# Patient Record
Sex: Male | Born: 1960 | Race: White | Hispanic: No | Marital: Single | Smoking: Former smoker
Health system: Southern US, Community
[De-identification: ages and names within clinical notes are randomized; demographics above are authoritative.]

## PROBLEM LIST (undated history)

## (undated) DIAGNOSIS — G43909 Migraine, unspecified, not intractable, without status migrainosus: Secondary | ICD-10-CM

## (undated) DIAGNOSIS — I10 Essential (primary) hypertension: Secondary | ICD-10-CM

## (undated) DIAGNOSIS — Q859 Phakomatosis, unspecified: Secondary | ICD-10-CM

## (undated) DIAGNOSIS — R569 Unspecified convulsions: Secondary | ICD-10-CM

## (undated) DIAGNOSIS — K219 Gastro-esophageal reflux disease without esophagitis: Secondary | ICD-10-CM

## (undated) DIAGNOSIS — I2510 Atherosclerotic heart disease of native coronary artery without angina pectoris: Secondary | ICD-10-CM

## (undated) DIAGNOSIS — J449 Chronic obstructive pulmonary disease, unspecified: Secondary | ICD-10-CM

## (undated) DIAGNOSIS — G8929 Other chronic pain: Secondary | ICD-10-CM

## (undated) DIAGNOSIS — M549 Dorsalgia, unspecified: Secondary | ICD-10-CM

## (undated) DIAGNOSIS — F319 Bipolar disorder, unspecified: Secondary | ICD-10-CM

## (undated) DIAGNOSIS — R0602 Shortness of breath: Secondary | ICD-10-CM

## (undated) DIAGNOSIS — R3915 Urgency of urination: Secondary | ICD-10-CM

## (undated) DIAGNOSIS — E782 Mixed hyperlipidemia: Secondary | ICD-10-CM

## (undated) DIAGNOSIS — E7800 Pure hypercholesterolemia, unspecified: Secondary | ICD-10-CM

## (undated) DIAGNOSIS — G4730 Sleep apnea, unspecified: Secondary | ICD-10-CM

## (undated) DIAGNOSIS — G4700 Insomnia, unspecified: Secondary | ICD-10-CM

## (undated) DIAGNOSIS — F79 Unspecified intellectual disabilities: Secondary | ICD-10-CM

## (undated) DIAGNOSIS — M1990 Unspecified osteoarthritis, unspecified site: Secondary | ICD-10-CM

## (undated) DIAGNOSIS — R918 Other nonspecific abnormal finding of lung field: Secondary | ICD-10-CM

## (undated) DIAGNOSIS — K259 Gastric ulcer, unspecified as acute or chronic, without hemorrhage or perforation: Secondary | ICD-10-CM

## (undated) DIAGNOSIS — R350 Frequency of micturition: Secondary | ICD-10-CM

## (undated) DIAGNOSIS — F419 Anxiety disorder, unspecified: Secondary | ICD-10-CM

## (undated) DIAGNOSIS — S0990XA Unspecified injury of head, initial encounter: Secondary | ICD-10-CM

## (undated) DIAGNOSIS — R7303 Prediabetes: Secondary | ICD-10-CM

## (undated) DIAGNOSIS — J45909 Unspecified asthma, uncomplicated: Secondary | ICD-10-CM

## (undated) DIAGNOSIS — Z87442 Personal history of urinary calculi: Secondary | ICD-10-CM

## (undated) DIAGNOSIS — M2550 Pain in unspecified joint: Secondary | ICD-10-CM

## (undated) DIAGNOSIS — N189 Chronic kidney disease, unspecified: Secondary | ICD-10-CM

## (undated) DIAGNOSIS — G629 Polyneuropathy, unspecified: Secondary | ICD-10-CM

## (undated) DIAGNOSIS — F329 Major depressive disorder, single episode, unspecified: Secondary | ICD-10-CM

## (undated) HISTORY — DX: Major depressive disorder, single episode, unspecified: F32.9

## (undated) HISTORY — DX: Mixed hyperlipidemia: E78.2

## (undated) HISTORY — DX: Phakomatosis, unspecified: Q85.9

## (undated) HISTORY — DX: Morbid (severe) obesity due to excess calories: E66.01

## (undated) HISTORY — DX: Other nonspecific abnormal finding of lung field: R91.8

## (undated) HISTORY — DX: Essential (primary) hypertension: I10

---

## 2007-08-02 HISTORY — PX: LITHOTRIPSY: SUR834

## 2009-05-29 ENCOUNTER — Ambulatory Visit: Payer: Self-pay | Admitting: Cardiology

## 2011-03-31 ENCOUNTER — Encounter: Payer: Self-pay | Admitting: Emergency Medicine

## 2011-03-31 ENCOUNTER — Emergency Department (HOSPITAL_COMMUNITY): Payer: PRIVATE HEALTH INSURANCE

## 2011-03-31 ENCOUNTER — Emergency Department (HOSPITAL_COMMUNITY)
Admission: EM | Admit: 2011-03-31 | Discharge: 2011-04-01 | Disposition: A | Discharge status: Other Acute Inpatient Hospital | Payer: PRIVATE HEALTH INSURANCE | Source: Home / Self Care | Attending: Emergency Medicine | Admitting: Emergency Medicine

## 2011-03-31 ENCOUNTER — Inpatient Hospital Stay (HOSPITAL_COMMUNITY): Admission: AD | Admit: 2011-03-31 | Payer: Medicare Other | Source: Ambulatory Visit | Admitting: Psychiatry

## 2011-03-31 DIAGNOSIS — R918 Other nonspecific abnormal finding of lung field: Secondary | ICD-10-CM

## 2011-03-31 DIAGNOSIS — R45851 Suicidal ideations: Secondary | ICD-10-CM | POA: Insufficient documentation

## 2011-03-31 DIAGNOSIS — R222 Localized swelling, mass and lump, trunk: Secondary | ICD-10-CM | POA: Insufficient documentation

## 2011-03-31 DIAGNOSIS — E119 Type 2 diabetes mellitus without complications: Secondary | ICD-10-CM | POA: Insufficient documentation

## 2011-03-31 DIAGNOSIS — I2510 Atherosclerotic heart disease of native coronary artery without angina pectoris: Secondary | ICD-10-CM | POA: Insufficient documentation

## 2011-03-31 DIAGNOSIS — E7800 Pure hypercholesterolemia, unspecified: Secondary | ICD-10-CM | POA: Insufficient documentation

## 2011-03-31 DIAGNOSIS — I10 Essential (primary) hypertension: Secondary | ICD-10-CM | POA: Insufficient documentation

## 2011-03-31 DIAGNOSIS — R079 Chest pain, unspecified: Secondary | ICD-10-CM

## 2011-03-31 DIAGNOSIS — F341 Dysthymic disorder: Secondary | ICD-10-CM | POA: Insufficient documentation

## 2011-03-31 HISTORY — DX: Essential (primary) hypertension: I10

## 2011-03-31 HISTORY — DX: Pure hypercholesterolemia, unspecified: E78.00

## 2011-03-31 HISTORY — DX: Polyneuropathy, unspecified: G62.9

## 2011-03-31 HISTORY — DX: Anxiety disorder, unspecified: F41.9

## 2011-03-31 HISTORY — DX: Atherosclerotic heart disease of native coronary artery without angina pectoris: I25.10

## 2011-03-31 LAB — RAPID URINE DRUG SCREEN, HOSP PERFORMED: Amphetamines: NOT DETECTED

## 2011-03-31 LAB — CBC
HCT: 42.6 % (ref 39.0–52.0)
MCV: 85.7 fL (ref 78.0–100.0)
MCV: 85.7 fL (ref 78.0–100.0)
Platelets: 206 10*3/uL (ref 150–400)
RBC: 4.97 MIL/uL (ref 4.22–5.81)
RBC: 4.9 MIL/uL (ref 4.22–5.81)
WBC: 11.2 10*3/uL — ABNORMAL HIGH (ref 4.0–10.5)
WBC: 11.2 10*3/uL — ABNORMAL HIGH (ref 4.0–10.5)

## 2011-03-31 LAB — DIFFERENTIAL
Eosinophils Relative: 1 % (ref 0–5)
Lymphocytes Relative: 20 % (ref 12–46)
Lymphs Abs: 2.3 10*3/uL (ref 0.7–4.0)
Neutro Abs: 8.1 10*3/uL — ABNORMAL HIGH (ref 1.7–7.7)

## 2011-03-31 LAB — CARDIAC PANEL(CRET KIN+CKTOT+MB+TROPI)
CK, MB: 4.1 ng/mL — ABNORMAL HIGH (ref 0.3–4.0)
Relative Index: 2.1 (ref 0.0–2.5)
Total CK: 194 U/L (ref 7–232)
Troponin I: <0.3 ng/mL (ref <0.30)

## 2011-03-31 LAB — COMPREHENSIVE METABOLIC PANEL
BUN: 13 mg/dL (ref 6–23)
CO2: 28 mEq/L (ref 19–32)
Chloride: 101 mEq/L (ref 96–112)
Creatinine, Ser: 0.91 mg/dL (ref 0.50–1.35)
GFR calc non Af Amer: >60 mL/min (ref >60)
Total Bilirubin: 0.5 mg/dL (ref 0.3–1.2)

## 2011-03-31 LAB — ETHANOL: Alcohol, Ethyl (B): <11 mg/dL (ref 0–11)

## 2011-03-31 LAB — ACETAMINOPHEN LEVEL: Acetaminophen (Tylenol), Serum: <15 ug/mL (ref 10–30)

## 2011-03-31 MED ORDER — METOPROLOL TARTRATE 50 MG PO TABS
ORAL_TABLET | ORAL | Status: AC
  Administered 2011-03-31: 25 mg
  Filled 2011-03-31: qty 1

## 2011-03-31 MED ORDER — LORAZEPAM 1 MG PO TABS
1.0000 mg | ORAL_TABLET | Freq: Once | ORAL | Status: AC
  Administered 2011-03-31: 1 mg via ORAL
  Filled 2011-03-31: qty 1

## 2011-03-31 MED ORDER — METOPROLOL SUCCINATE ER 25 MG PO TB24
25.0000 mg | ORAL_TABLET | Freq: Every day | ORAL | Status: DC
  Administered 2011-03-31: 25 mg via ORAL

## 2011-03-31 MED ORDER — IOHEXOL 350 MG/ML SOLN
120.0000 mL | Freq: Once | INTRAVENOUS | Status: AC | PRN
  Administered 2011-03-31: 120 mL via INTRAVENOUS

## 2011-03-31 NOTE — Progress Notes (Signed)
Patient reports experiencing palpitations onset several minutes ago and persistent since. Will order Cardiac panel and EKG.

## 2011-03-31 NOTE — ED Provider Notes (Addendum)
History   Scribed for Tamanna Whitson K Trequan Marsolek-Rasch, MD, the patient was seen in room APA17/APA17. This chart was scribed by Clarita Crane. This patient's care was started at 4:58PM.  CSN: [id number] Arrival date & time: 03/31/2011  4:56 PM  Chief Complaint  Patient presents with  . Medical Clearance   The history is provided by the patient and a relative.  History also provided by accompanying IVC paperwork.   Charles Mathews is a 50 y.o. male who presents to the Emergency Department complaining of suicidal ideations. Patient states he was being treated at [facility] today and expressed SI to counselor and noted a plan to overdose on "anything I had". Patient states his "nerves have been bad" and attributes this to his medications.  Denies auditory hallucinations, visual hallucinations, HI, previous suicide attempts, previous hospitalizations for depression or other psychiatric symptoms. Patient also notes dysuria onset yesterday and persistent since. Denies penile d/c. Patient with h/o hypertension, neuropathy, high cholesterol, CAD, diabetes, anxiety and depression.   HPI ELEMENTS  Onset: today Duration: persistent since onset  Timing: constant  Context:  as above  Associated symptoms:  Denies auditory hallucinations, visual hallucinations, HI,     PAST MEDICAL HISTORY:  Past Medical History  Diagnosis Date  . Hypertension   . Neuropathy   . High cholesterol   . Coronary artery disease   . Diabetes mellitus   . Anxiety   . Depression     PAST SURGICAL HISTORY:  History reviewed. No pertinent past surgical history.  MEDICATIONS:  Previous Medications   No medications on file     ALLERGIES:  Allergies as of 03/31/2011 - never reviewed  Allergen Reaction Noted  . Penicillins  03/31/2011     FAMILY HISTORY:  History reviewed. No pertinent family history.   SOCIAL HISTORY: History   Social History  . Marital Status: Single    Spouse Name: N/A    Number of Children: N/A   . Years of Education: N/A   Social History Main Topics  . Smoking status: Never Smoker   . Smokeless tobacco: None  . Alcohol Use: No  . Drug Use: No  . Sexually Active:    Other Topics Concern  . None   Social History Narrative  . None      Review of Systems 10 Systems reviewed and are negative for acute change except as noted in the HPI.  Physical Exam  BP 160/86  Pulse 87  Temp 98.9 F (37.2 C)  Resp 20  Ht 5\' 9"  (1.753 m)  Wt 295 lb (133.811 kg)  BMI 43.56 kg/m2  SpO2 97%  Physical Exam  Constitutional: He is oriented to person, place, and time. He appears well-developed and well-nourished.  HENT:  Head: Normocephalic and atraumatic.  Mouth/Throat: No oropharyngeal exudate.       Moist mucous membranes.   Eyes: Conjunctivae are normal. Pupils are equal, round, and reactive to light.  Neck: Neck supple. No tracheal deviation present. No thyromegaly present.  Cardiovascular: Normal rate and regular rhythm.  Exam reveals no gallop and no friction rub.   Murmur (systolic ejection 1/6) heard. Pulmonary/Chest: Effort normal and breath sounds normal. He has no wheezes.  Abdominal: Soft. Bowel sounds are normal. He exhibits no distension and no mass. There is no tenderness. There is no guarding.  Musculoskeletal: Normal range of motion. He exhibits no edema.  Lymphadenopathy:    He has no cervical adenopathy.  Neurological: He is alert and oriented to person, place, and  time. He has normal strength. No sensory deficit. GCS eye subscore is 4. GCS verbal subscore is 5. GCS motor subscore is 6.       No sensory deficits.   Skin: Skin is warm and dry.  Psychiatric: He has a normal mood and affect. His behavior is normal. He expresses suicidal ideation. He expresses no homicidal ideation.    ED Course  Procedures  OTHER DATA REVIEWED: Nursing notes, vital signs, and past medical records reviewed. Lab results reviewed and considered Imaging results reviewed and  considered  DIAGNOSTIC STUDIES: Oxygen Saturation is 97% on room air, normal by my interpretation.    LABS / RADIOLOGY: Results for orders placed during the hospital encounter of 03/31/11  CBC      Component Value Range   WBC 11.2 (*) 4.0 - 10.5 (K/uL)   RBC 4.97  4.22 - 5.81 (MIL/uL)   Hemoglobin 13.7  13.0 - 17.0 (g/dL)   HCT 56.2  13.0 - 86.5 (%)   MCV 85.7  78.0 - 100.0 (fL)   MCH 27.6  26.0 - 34.0 (pg)   MCHC 32.2  30.0 - 36.0 (g/dL)   RDW 78.4  69.6 - 29.5 (%)   Platelets 180  150 - 400 (K/uL)   No results found.  PROCEDURES:  ED COURSE / COORDINATION OF CARE: Orders Placed This Encounter  Procedures  . CBC  . Comprehensive metabolic panel  . Ethanol  . Acetaminophen level  . Drug screen panel, emergency  . Sitter at bedside  . Flight risk     MDM: Differential Diagnosis: suicidal idation   PLAN: ACT and hospital admssion The patient is to return the emergency department if there is any worsening of symptoms. I have reviewed the discharge instructions with the patient/family  CONDITION ON DISCHARGE:    MEDICATIONS GIVEN IN THE E.D. Medications - No data to display   Date: 03/31/2011  Rate:106  Rhythm: sinus tachycardia  QRS Axis: normal  Intervals: PR prolonged  ST/T Wave abnormalities: normal  Conduction Disutrbances:none  Narrative Interpretation: sinus tachycardia  Old EKG Reviewed: none available   I personally performed the services described in this documentation, which was scribed in my presence. The recorded information has been reviewed and considered. Kdyn Vonbehren Smitty Cords, MD  Negative [facility] score      Carmelita Amparo K Renn Stille-Rasch, MD 03/31/11 1813  Danyah Guastella K Jacci Ruberg-Rasch, MD 03/31/11 1958  Tammie Yanda K Clydean Posas-Rasch, MD 03/31/11 2115

## 2011-03-31 NOTE — ED Notes (Addendum)
Patient report given to this nurse. Assuming care of patient. Into room to see patient. Standing up at bedside. States he just doesn't feel right. When asked how he doesn't feel right, patient states he does not know. States he feels like his heart is beating fast. Vitals rechecked. See vitals signs for reassessment. Placed in bed. Resting comfortably at this time. Sitter at bedside. Patient denies any needs. MD aware. Note written by Milinda Cave, RN.

## 2011-03-31 NOTE — ED Notes (Signed)
Per ivc papers-pt is depressed, suicidal, paranoid and has plan to overdose. He has no support system. Pt denies si/hi. Cooperative in triage.

## 2011-03-31 NOTE — ED Notes (Addendum)
Remains resting with eyes open and lights on. Denies any needs or pain. Sitter with patient. Call bell near patient. Will continue to monitor. No shortness of breath or chest pain.

## 2011-03-31 NOTE — ED Notes (Signed)
Patient resting sitting up in chair. Denies pain. Denies any needs. No shortness of breath. Sitter at bedside. No distress. Call bell near patient.

## 2011-03-31 NOTE — ED Notes (Signed)
Patient back to room from radiology. 

## 2011-03-31 NOTE — ED Notes (Signed)
Patient to radiology at this time.

## 2011-03-31 NOTE — ED Notes (Signed)
Patient calmer at this time. Sitting up in chair. Denies pain. Vitals rechecked. Denies any needs at this time. Calm, cooperative. Will continue to monitor.

## 2011-03-31 NOTE — ED Notes (Signed)
After each medication administration, patient has gone to the bathroom shortly after. Asked patient if he has swallowed medication after each administration. States he has been. MD aware.

## 2011-03-31 NOTE — ED Notes (Signed)
Into room to see patient. Patient standing up at bedside. Appears anxious. States he "just doesn't feel right". Repeats self saying, "please help me." rechecked vitals. Patient is tachycardic. Denies pain but states he feels like his heartbeat is in his stomach. Active bowel sounds. States it is tender on palpation. Chest not tender. Is slightly shaky. MD made aware of situation.

## 2011-03-31 NOTE — ED Notes (Signed)
Patient back to room from radiology. No distress. Denies any needs. Call bell and sitter at bedside. Will continue to monitor.

## 2011-03-31 NOTE — ED Notes (Signed)
MD at bedside with patient

## 2011-03-31 NOTE — ED Notes (Signed)
Patient ambulatory to radiology at this time. States he is feeling a lot better. Denies pain.

## 2011-03-31 NOTE — ED Notes (Signed)
Per sitter, prior to this nurse's arrival to room, patient was in kneeling position in room then got down into floor into fetal position. Patient was gotten up out of floor and sat on bed. When this nurse arrived into room, asked patient if he took any medications or drugs prior to arrival. Denies any type of drug use. MD is aware of situation.

## 2011-04-01 ENCOUNTER — Inpatient Hospital Stay (HOSPITAL_COMMUNITY)
Admission: AD | Admit: 2011-04-01 | Discharge: 2011-04-05 | DRG: 204 | Disposition: A | Discharge status: Other Acute Inpatient Hospital | Payer: PRIVATE HEALTH INSURANCE | Source: Other Acute Inpatient Hospital | Attending: Internal Medicine | Admitting: Internal Medicine

## 2011-04-01 DIAGNOSIS — R079 Chest pain, unspecified: Secondary | ICD-10-CM | POA: Diagnosis present

## 2011-04-01 DIAGNOSIS — F259 Schizoaffective disorder, unspecified: Secondary | ICD-10-CM | POA: Diagnosis present

## 2011-04-01 DIAGNOSIS — D72829 Elevated white blood cell count, unspecified: Secondary | ICD-10-CM | POA: Diagnosis present

## 2011-04-01 DIAGNOSIS — R222 Localized swelling, mass and lump, trunk: Secondary | ICD-10-CM

## 2011-04-01 DIAGNOSIS — Z6835 Body mass index (BMI) 35.0-35.9, adult: Secondary | ICD-10-CM

## 2011-04-01 DIAGNOSIS — I10 Essential (primary) hypertension: Secondary | ICD-10-CM | POA: Diagnosis present

## 2011-04-01 DIAGNOSIS — R45851 Suicidal ideations: Secondary | ICD-10-CM

## 2011-04-01 DIAGNOSIS — K219 Gastro-esophageal reflux disease without esophagitis: Secondary | ICD-10-CM | POA: Diagnosis present

## 2011-04-01 DIAGNOSIS — N390 Urinary tract infection, site not specified: Secondary | ICD-10-CM | POA: Diagnosis present

## 2011-04-01 LAB — COMPREHENSIVE METABOLIC PANEL
ALT: 18 U/L (ref 0–53)
AST: 15 U/L (ref 0–37)
Albumin: 3.5 g/dL (ref 3.5–5.2)
CO2: 26 mEq/L (ref 19–32)
Calcium: 9 mg/dL (ref 8.4–10.5)
GFR calc non Af Amer: >60 mL/min (ref >60)
Sodium: 138 mEq/L (ref 135–145)
Total Protein: 6 g/dL (ref 6.0–8.3)

## 2011-04-01 LAB — URINALYSIS, MICROSCOPIC ONLY
Leukocytes, UA: NEGATIVE
Nitrite: NEGATIVE
Specific Gravity, Urine: 1.015 (ref 1.005–1.030)
Urine-Other: NONE SEEN
Urobilinogen, UA: 0.2 mg/dL (ref 0.0–1.0)

## 2011-04-01 LAB — CARDIAC PANEL(CRET KIN+CKTOT+MB+TROPI)
CK, MB: 3.5 ng/mL (ref 0.3–4.0)
Relative Index: 1.8 (ref 0.0–2.5)
Troponin I: <0.3 ng/mL (ref <0.30)
Troponin I: <0.3 ng/mL (ref <0.30)

## 2011-04-01 LAB — CBC
MCH: 28.4 pg (ref 26.0–34.0)
Platelets: 170 10*3/uL (ref 150–400)
RBC: 4.58 MIL/uL (ref 4.22–5.81)
RDW: 12.8 % (ref 11.5–15.5)

## 2011-04-01 NOTE — ED Notes (Addendum)
Dr. Rito Ehrlich at bedside with patient to evaluate for admission.

## 2011-04-01 NOTE — ED Notes (Signed)
Remains resting on left side in bed. No facial grimaces. Equal chest rise and fall. Call bell and sitter with patient.

## 2011-04-01 NOTE — ED Notes (Signed)
Patient to be transferred to [facility]. No bed assignment as of yet. AP charge nurse called [facility] Supervisor regarding sitter for patient. No available sitters at [facility]. Supervisor calling [facility] regarding available sitters.

## 2011-04-01 NOTE — ED Notes (Signed)
Remains resting in bed on back. Equal chest rise and fall. No distress. No facial grimacing. Call bell near patient.

## 2011-04-01 NOTE — ED Notes (Signed)
Remains resting in bed on left side. No facial grimaces. Call bell and sitter with patient. Equal chest rise and fall. No distress.

## 2011-04-01 NOTE — ED Notes (Signed)
Patient sitting up in bed. States he can not sleep and would like something to sleep. Patients pain 5\10. Denies other needs. Call bell and sitter at bedside. MD aware.

## 2011-04-01 NOTE — ED Notes (Signed)
Pt resting in bed, sitter at bedside

## 2011-04-01 NOTE — ED Notes (Signed)
Report to cristy rn

## 2011-04-01 NOTE — ED Notes (Signed)
Pt awake offered, meal/snack, bathroom, declined offer, vss, sitter remains at bedside.  Awaiting transport to [facility]

## 2011-04-01 NOTE — ED Notes (Signed)
Into room to obtain discharge vitals. Patient states he has been having chest discomfort since beginning of shift when episode of "not feeling right" started but states it is a 6\10 and is feeling a lot better. Denies wanting anything for the pain.

## 2011-04-01 NOTE — ED Notes (Signed)
Unit clerk spoke to briget w. [facility], stated that they are unable to give room assignment at this time, nor can send a [facility] truck until after 7am.

## 2011-04-01 NOTE — ED Notes (Signed)
Pt cont. To await transfer, awaiting bed assignment at [facility] long.

## 2011-04-01 NOTE — ED Notes (Signed)
Pt asleep in chair, sitter at bedside, awaiting transport to [place].

## 2011-04-01 NOTE — ED Notes (Signed)
Patient up to bathroom. Steady gait. Denies any needs. No distress. Equal chest rise and fall.

## 2011-04-01 NOTE — ED Notes (Signed)
Remains resting in bed on left side. Eyes closed. No distress. Equal chest rise and fall. No facial grimacing. Sitter with patient. Call bell at bedside.

## 2011-04-02 LAB — CBC
MCHC: 33.5 g/dL (ref 30.0–36.0)
Platelets: 210 10*3/uL (ref 150–400)
RDW: 13 % (ref 11.5–15.5)
WBC: 12.5 10*3/uL — ABNORMAL HIGH (ref 4.0–10.5)

## 2011-04-02 LAB — COMPREHENSIVE METABOLIC PANEL
AST: 16 U/L (ref 0–37)
Albumin: 3.8 g/dL (ref 3.5–5.2)
Alkaline Phosphatase: 85 U/L (ref 39–117)
Chloride: 103 mEq/L (ref 96–112)
Potassium: 3.8 mEq/L (ref 3.5–5.1)
Total Bilirubin: 0.6 mg/dL (ref 0.3–1.2)
Total Protein: 7.1 g/dL (ref 6.0–8.3)

## 2011-04-02 LAB — LIPID PANEL
LDL Cholesterol: 78 mg/dL (ref 0–99)
Total CHOL/HDL Ratio: 3.4 RATIO
Triglycerides: 84 mg/dL (ref <150)
VLDL: 17 mg/dL (ref 0–40)

## 2011-04-02 LAB — PSA: PSA: 0.15 ng/mL (ref <=4.00)

## 2011-04-03 LAB — URINE CULTURE: Culture  Setup Time: 201209010041

## 2011-04-03 LAB — CBC
HCT: 42.4 % (ref 39.0–52.0)
Hemoglobin: 13.4 g/dL (ref 13.0–17.0)
MCH: 27.3 pg (ref 26.0–34.0)
MCHC: 31.6 g/dL (ref 30.0–36.0)
MCV: 86.4 fL (ref 78.0–100.0)
Platelets: 193 K/uL (ref 150–400)
RBC: 4.91 MIL/uL (ref 4.22–5.81)
RDW: 12.9 % (ref 11.5–15.5)
WBC: 11.8 K/uL — ABNORMAL HIGH (ref 4.0–10.5)

## 2011-04-04 LAB — BASIC METABOLIC PANEL
CO2: 24 mEq/L (ref 19–32)
Calcium: 9.3 mg/dL (ref 8.4–10.5)
Chloride: 101 mEq/L (ref 96–112)
Glucose, Bld: 112 mg/dL — ABNORMAL HIGH (ref 70–99)
Potassium: 4.3 mEq/L (ref 3.5–5.1)
Sodium: 136 mEq/L (ref 135–145)

## 2011-04-05 ENCOUNTER — Inpatient Hospital Stay (HOSPITAL_COMMUNITY)
Admission: AD | Admit: 2011-04-05 | Discharge: 2011-04-08 | DRG: 885 | Disposition: A | Discharge status: Home / Self Care | Payer: PRIVATE HEALTH INSURANCE | Source: Ambulatory Visit | Attending: Psychiatry | Admitting: Psychiatry

## 2011-04-05 DIAGNOSIS — K449 Diaphragmatic hernia without obstruction or gangrene: Secondary | ICD-10-CM

## 2011-04-05 DIAGNOSIS — F332 Major depressive disorder, recurrent severe without psychotic features: Principal | ICD-10-CM

## 2011-04-05 DIAGNOSIS — I10 Essential (primary) hypertension: Secondary | ICD-10-CM

## 2011-04-05 DIAGNOSIS — R45851 Suicidal ideations: Secondary | ICD-10-CM

## 2011-04-05 DIAGNOSIS — Z880 Allergy status to penicillin: Secondary | ICD-10-CM

## 2011-04-05 DIAGNOSIS — G4730 Sleep apnea, unspecified: Secondary | ICD-10-CM

## 2011-04-05 DIAGNOSIS — E119 Type 2 diabetes mellitus without complications: Secondary | ICD-10-CM

## 2011-04-05 DIAGNOSIS — Z79899 Other long term (current) drug therapy: Secondary | ICD-10-CM

## 2011-04-05 DIAGNOSIS — F200 Paranoid schizophrenia: Secondary | ICD-10-CM

## 2011-04-05 DIAGNOSIS — K219 Gastro-esophageal reflux disease without esophagitis: Secondary | ICD-10-CM

## 2011-04-05 DIAGNOSIS — R222 Localized swelling, mass and lump, trunk: Secondary | ICD-10-CM

## 2011-04-05 LAB — GLUCOSE, CAPILLARY: Glucose-Capillary: 176 mg/dL — ABNORMAL HIGH (ref 70–99)

## 2011-04-05 NOTE — Discharge Summary (Signed)
  NAME:  Charles Mathews, Charles Mathews NO.:  [id number]  MEDICAL RECORD NO.:  [id number]  LOCATION:  1438                         FACILITY:  [facility]  PHYSICIAN:  Conley Canal, MD      DATE OF BIRTH:  Aug 21, 1960  DATE OF ADMISSION:  04/01/2011 DATE OF DISCHARGE:                        DISCHARGE SUMMARY - REFERRING   DATE OF DISCHARGE:  To be determined.  PRIMARY CARE PHYSICIAN:  Selinda Flavin, MD, in [place].  CONSULTING PHYSICIAN:  Casimiro Needle B. Sherene Sires, MD, FCCP, and Eulogio Ditch, MD  DISCHARGE DIAGNOSES: 1. Right lung mass, seems chronic, probably benign. 2. Schizoaffective disorder. 3. Suicidal ideation. 4. Hypertension. 5. Morbid obesity, BMI greater than 35. 6. Gastroesophageal reflux disease.  DISCHARGE MEDICATIONS:  To be determined.  IMAGING STUDIES:  CT of the chest with contrast as well as chest x-ray showing base of right upper lobe medial aspect mass with incomplete internal calcification with differentials of remote hematoma, less likely paraganglioma, less likely incompletely thrombosed venous varix, solitary metastasis or bronchogenic carcinoma.  HOSPITAL COURSE:  Charles Mathews was transferred from [facility] Emergency Room on August 31st having presented with question of suicidal ideation and some transient chest pain.  He had a CT of the chest, which suggested a right lung mass as discussed above.  Because of those concerns, the patient was transferred to [facility] for further workup. Pulmonary has seen the patient and requested records from [facility].  The records show that the patient has had this right lower lobe lung mass dating back to at least February of this year.  I discussed with Dr. Sandrea Hughs, who felt that no further workup inpatient was warranted and that the patient can follow up with [facility] in the next 1 month with his old x-rays.  I will put a phone number in the chart for the patient to call [facility] to set  up appointment.  Regarding suicidal ideation, the patient himself cannot give a clear history, but Behavioral Health has seen the patient.  He has not been suicidal since admission, but Dr. Rogers Blocker will see the patient later today to determine if he is safe to go home versus being transferred to [facility].  Once this decision has been made, the patient can be discharged.  His labs include normal electrolytes.  Urine culture showing insignificant number of colonies. WBC 11.8, hemoglobin 13.4, hematocrit 42.4, platelet count 193.  TSH, PSA normal.  Lipids panel negative.  Of note is that the patient complained of some chest pains, but cardiac workup including serial cardiac enzymes and EKGs have been essentially normal.  Initially, he was thought to have UTI and placed on Levaquin, but this was discontinued as his urinalysis and urine culture were negative. Hopefully, he can be discharged soon.     Conley Canal, MD     SR/MEDQ  D:  04/05/2011  T:  04/05/2011  Job:  [id number]  cc:   Charlaine Dalton. Sherene Sires, MD, FCCP [address] [place] [address]  Eulogio Ditch, MD  Selinda Flavin, MD Fax: [phone]  Electronically Signed by Conley Canal  on 04/05/2011 08:47:15 PM

## 2011-04-05 NOTE — H&P (Addendum)
NAME:  Charles Mathews, Charles Mathews NO.:  [id number]  MEDICAL RECORD NO.:  [id number]  LOCATION:                                 FACILITY:  PHYSICIAN:  Osvaldo Shipper, MD     DATE OF BIRTH:  1961-07-10  DATE OF ADMISSION:  04/01/2011 DATE OF DISCHARGE:                             HISTORY & PHYSICAL   The patient's primary care physician is Dr. Dimas Aguas from [facility] in [place].  ADMISSION DIAGNOSES: 1. Right lung mass, requiring further evaluation. 2. Chest pain, possibly from the right lung mass. 3. History of schizophrenia and depression. 4. History of suicidal ideation.  CHIEF COMPLAINT:  Suicidal and transient chest pain.  HISTORY OF PRESENT ILLNESS:  The patient is a 50 year old Caucasian male with a history of depression, schizophrenia, hypertension, and GERD who was in his usual state of health until about 2 days ago when he started having suicidal thoughts.  He thought about taking all his medications in order to hurt himself.  However, he denied taking any of the medications.  So, the patient went to see his doctor who committed him into the hospital.  He was sent over to [facility] Emergency Department and was evaluated by the psychologist and the plan was for the patient to be sent to [facility] for inpatient psychiatric evaluation and treatment.  While he was waiting, the patient started having some chest pain, shortness of breath, palpitations, so evaluation was done in the ED here, which revealed right lung mass.  At this time, the patient does not have any symptoms.  He is a very poor historian, possibly because of his psychiatric history.  He is unable to give me much information at this time.  Medications at home based on what is pulled into the Epic system here he is on, 1. Albuterol inhaler as needed. 2. Amoxapine 100 mg at bedtime. 3. Cogentin 1 mg at bedtime. 4. Cardura 2 mg at bedtime. 5. Zetia 10 mg at bedtime. 6. Gabapentin 300 mg at  bedtime. 7. Metoprolol 50 mg twice daily. 8. Omeprazole 20 mg daily. 9. Invega 6 mg every morning. 10.Systane eye drops 1 drop to each eye as needed. 11.Simvastatin 40 mg at bedtime. 12.Topamax 25 mg twice daily.  ALLERGIES:  PENICILLIN, which causes swelling.  PAST MEDICAL HISTORY:  Positive for, 1. Depression. 2. GERD.  PAST SURGICAL HISTORY:  He has had surgery for kidney stones, otherwise he cannot recall any other surgical procedures.  SOCIAL HISTORY:  Lives in [place] by himself.  Denies smoking, alcohol, or illicit drug use.  FAMILY HISTORY:  Adopted, so he does not know any medical problems in his biological family.  REVIEW OF SYSTEMS:  Unable to do because of psychiatric disease.  PHYSICAL EXAMINATION:  VITAL SIGNS:  Temperature is 97.6, heart rate is 75, blood pressure 128/64, saturation 100% on room air.  When he was tachycardic, his heart rate was at 116. GENERAL:  Overweight white male in no distress. HEENT:  Head is normocephalic, atraumatic.  Pupils are equal, reacting. No pallor.  No icterus.  Oral mucous membranes moist.  No oral lesions are noted. NECK:  Soft and supple.  No thyromegaly appreciated.  LUNGS:  Clear to auscultation with decreased air entry at the bases.  No obvious wheezing or crackles are present. CARDIOVASCULAR:  S1, S2 normal, regular.  No S3, S4, rubs, murmurs, or bruit. ABDOMEN:  Soft, nontender, nondistended.  Bowel sounds are present.  No masses or organomegaly appreciated. GU:  Deferred. MUSCULOSKELETAL:  Normal muscle, mass, and tone. NEUROLOGIC:  He is alert, oriented x3.  No focal neurological deficits are present. SKIN:  Does not reveal any rashes.  LABORATORY DATA:  His electrolytes are normal.  His liver function tests are normal.  Troponin is less than 0.3 x1.  WBC count is 11.2.  His other parameters are all normal.  Acetaminophen level less than 15. Alcohol less than 11.  Urine drug screen undetected.  Chest x-ray was  done, which showed a 3.5 x 3.8 cm mass within the posterior medial aspect of the right mid to upper lung.  Age indeterminate, compression deformity of the lower thoracic vertebral body was also seen.  The patient subsequently had a CT angio, which showed similar mass with incomplete internal calcification.  No other acute findings was noted.  EKG was done which shows a sinus tachycardia at 106 with normal axis, intervals appear to be in the normal range, insignificant Q waves noted in I, II, aVL.  No other concerning ST changes or T wave changes are noted.  No older EKGs available for comparison.  ASSESSMENT:  This is a 50 year old Caucasian male with a past medical history as stated earlier who presented with suicidal ideation and had transient chest pain and is found to have a right lung mass. 1. Right leg mass.  Because of his psychiatric illness as well this     patient will benefit from going to [facility].  Transfer is being     facilitated.  He will be seen by Pulmonology over there tomorrow.     I have already discussed this with Dr. Darrick Penna and further evaluation     and management per them. 2. Chest pain, likely result of the lung mass.  No PE has been seen on     the CT scan.  We will cycle his cardiac enzymes to rule him out for     acute coronary syndrome.  Aspirin will be given for now. 3. Suicidal ideation.  I have informed the Behavioral Health and the     psychiatrist will see him tomorrow.  A sitter will be utilized     until then.  We will hold off on all of his psych medications for     now. 4. History of gastroesophageal reflux disease.  Continue with PPI. 5. History of hypertension.  Continue with metoprolol. 6. Further management decisions will depend on results of further     testing and patient's response to treatment. 7. Deep venous thrombosis prophylaxis will be utilized. 8. The patient is a full code.     Osvaldo Shipper, MD     GK/MEDQ  D:   04/01/2011  T:  04/01/2011  Job:  [id number]  cc:   Dr. Dimas Aguas [facility]  Electronically Signed by Osvaldo Shipper MD on 04/05/2011 07:18:32 PM

## 2011-04-06 DIAGNOSIS — F411 Generalized anxiety disorder: Secondary | ICD-10-CM

## 2011-04-06 DIAGNOSIS — F259 Schizoaffective disorder, unspecified: Secondary | ICD-10-CM

## 2011-04-06 LAB — GLUCOSE, CAPILLARY
Glucose-Capillary: 119 mg/dL — ABNORMAL HIGH (ref 70–99)
Glucose-Capillary: 135 mg/dL — ABNORMAL HIGH (ref 70–99)
Glucose-Capillary: 151 mg/dL — ABNORMAL HIGH (ref 70–99)
Glucose-Capillary: 202 mg/dL — ABNORMAL HIGH (ref 70–99)

## 2011-04-06 NOTE — Consult Note (Signed)
  NAME:  Charles Mathews, Charles Mathews NO.:  [id number]  MEDICAL RECORD NO.:  [id number]  LOCATION:  1438                         FACILITY:  [facility]  PHYSICIAN:  Eulogio Ditch, MD DATE OF BIRTH:  1961-07-02  DATE OF CONSULTATION:  04/05/2011 DATE OF DISCHARGE:                                CONSULTATION   HISTORY OF PRESENT ILLNESS:  A 50 year old Caucasian male with a history of schizoaffective disorder who was admitted from [facility] because of chest pain.  He had a CT of the chest which suggested a right lung mass.  As per the medical team he is going to follow up in the outpatient setting with Dr. Sandrea Hughs.  Dr. Sandrea Hughs is a [place] pulmonary doctor.  The patient still reported depressed mood and hearing voices but he denied any suicidal ideations.  The patient reported that he earlier was feeling suicidal and was thinking of overdosing.  SUBSTANCE ABUSE HISTORY:  The patient denies abuse of any drugs.  PAST PSYCHIATRIC HISTORY:  The patient is on Invega 6 mg p.o. daily along with Cogentin 1 mg at bedtime.  The patient follows at [facility] in the outpatient setting.  The patient has not been admitted to inpatient psychiatry for a long period of time.  PAST MEDICAL HISTORY:  See the history and physical and discharge summary of the medical team.  ALLERGIES:  The patient is allergic to penicillin, Ativan.  MENTAL STATUS EXAM:  The patient is a 50 year old Caucasian male who is depressed but logical and goal directed but verbalized hearing voices. He is denying suicidal or homicidal ideation but earlier was thinking of overdosing on a number of pills.  The patient does not seem to be internally preoccupied or delusional.  I spoke with RN and as per her his behavior is under control for the last 3-4 days on the medical floor.  The patient is alert, awake, oriented x3.  Memory immediate, recent and remote fair.  Attention and concentration fair.   Abstraction ability fair.  Insight and judgment fair to poor.  DIAGNOSES:  AXIS I:  Schizoaffective disorder. AXIS II:  Deferred. AXIS III:  See medical notes. AXIS IV:  Chronic mental health issues. AXIS V:  40.  RECOMMENDATIONS:  Once the patient is medically cleared the patient can be transferred to [facility] for further stabilization as the patient is still depressed and hearing voices.  Thanks for involving me in taking care of this patient.A     Eulogio Ditch, MD     SA/MEDQ  D:  04/05/2011  T:  04/05/2011  Job:  [id number]  Electronically Signed by Eulogio Ditch  on 04/06/2011 03:01:22 PM

## 2011-04-07 LAB — GLUCOSE, CAPILLARY: Glucose-Capillary: 125 mg/dL — ABNORMAL HIGH (ref 70–99)

## 2011-04-07 NOTE — Assessment & Plan Note (Signed)
  NAME:  Charles Mathews, BUENAVENTURA NO.:  [id number]  MEDICAL RECORD NO.:  [id number]  LOCATION:  0506                          FACILITY:  [facility]  PHYSICIAN:  Franchot Gallo, MD     DATE OF BIRTH:  20-Feb-1961  DATE OF ADMISSION:  04/05/2011 DATE OF DISCHARGE:                      PSYCHIATRIC ADMISSION ASSESSMENT   IDENTIFYING INFORMATION:  This is a 50 year old male, voluntarily admitted on April 05, 2011.  HISTORY OF PRESENT ILLNESS:  The patient reports with a history of depression and anxiety.  He was a transfer from the Medical Unit after the patient was admitted for a right lung mass noted on his chest x-ray. The patient was reporting symptoms of depression, was seen by psychiatry and was recommended admission for further assessment.  He reports trouble sleeping and a decreased appetite, rating his depression a 7 on a scale of 1-10, rating his anxiety high, a 10 on a scale of 1-10.  He denies any psychotic symptoms.  Denies any substance use.  PAST PSYCHIATRIC HISTORY:  First admission to the [facility], is a client of [facility] in [place].  He sees Dr. Tiburcio Pea.  SOCIAL HISTORY:  The patient is single and lives alone.  He has very little support.  He resides in [place].  He has no siblings.  FAMILY HISTORY:  None.  ALCOHOL AND DRUG HISTORY:  Denies any alcohol or substance use.  PRIMARY CARE PROVIDER:  Dr. Dimas Aguas in [place], [place].  MEDICAL PROBLEMS:  The patient was discharged with a right lung mass that seems chronic, probably benign, hypertension, morbid obesity and GERD.  MEDICATIONS:  none listed on discharge summary  MENTAL STATUS EXAM: Fully alert, cooperative fair eye contact. Mood depressed, anxious. Affect flat. Thought processes coherent, denis si or hi. No evidence of any psychotic symptoms. Judgement and insight fair. AXIS 1: Major Depressive Disorder recurrent Severe AXIS 11: deferred AXIS 111: Right Lung mass that seems chronic,  prpbably benign, hypertension, morbid obesity and GERD AXIS 1V: Mecical problems, poor social support AXIS V: GAF 40  Plan: Review medications, place patient on antidepressant, discuss living arrangements, and obtain follow up with medicine as recommended.     Landry Corporal, N.P.   ______________________________ Franchot Gallo, MD    JO/MEDQ  D:  04/06/2011  T:  04/06/2011  Job:  [id number]  Electronically Signed by Limmie PatriciaP. on 04/07/2011 09:22:57 AM Electronically Signed by Franchot Gallo MD on 04/07/2011 01:40:06 PM

## 2011-04-07 NOTE — Assessment & Plan Note (Signed)
  NAME:  Charles Mathews, Charles Mathews NO.:  [id number]  MEDICAL RECORD NO.:  [id number]  LOCATION:                                 FACILITY:  PHYSICIAN:  Franchot Gallo, MD     DATE OF BIRTH:  30-Nov-1960  DATE OF ADMISSION: DATE OF DISCHARGE:                      PSYCHIATRIC ADMISSION ASSESSMENT   ADDENDUM TO PSYCHIATRIC ADMISSION NOTE:  We continued the patient's home medications, which were: 1. Topamax 25 mg b.i.d. 2. Invega 60 mg daily. 3. Omeprazole 20 mg daily. 4. Metoprolol 50 mg b.i.d. 5. Cogentin 1 mg q.h.s. 6. Gabapentin 300 mg q.h.s. 7. Simvastatin 40 mg q.h.s. 8. Zetia 10 mg q.h.s. 9. Doxazosin 2 mg q.h.s. 10.Amoxapine 50 mg 2 q.h.s.  DRUG ALLERGIES:  PENICILLIN AND ATIVAN.  PHYSICAL EXAMINATION:  This is a middle-aged male, fully alert and cooperative.  He denies any physical complaints.  He had a physical examination during his medical stay.  CAT scan suggested a right lung mass.  His urine culture showed an insignificant number of colonies.  His white count was 11.8.  Lipid panel was negative.  TSH and PSA were normal.  MENTAL STATUS EXAM:  The patient was seen in the interdisciplinary treatment team.  He was fully alert.  Fair eye contact.  Somewhat disheveled.  Speech was soft-spoken, monotone.  Mood was depressed and anxious.  He did not appear to be overly anxious, but he did answer his questions briefly but coherently.  Thought processes were coherent.  He denied any  suicidal or homicidal thoughts and did not appear to be actively psychotic.  Cognitive function intact.  His memory was fair. Judgment and insight were fair.  AXIS I:  Major depressive disorder, recurrent, severe. AXIS II:  Deferred. Axis III:  History of a right lung mass, seems chronic, probably benign. Hypertension.  Morbid obesity.  GERD. AXIS IV:  Medical problems, other psychosocial problems as the patient reports feeling depressed when he is alone.  AXIS V:  Current is  30.  Our plan is to at this time continue with his home medications.  The patient would benefit from an antidepressant.  We did discuss briefly the patient going to an assisted-living facility.  He has been thinking about it but feels he would probably rather return to his own home. Case manager will get followup appointments with Dr. Tiburcio Pea, and the patient is to follow up with [facility], which we will attempt to schedule before the patient is discharged.  His tentative length of stay at this time is 3 to 5 days.     Landry Corporal, N.P.   ______________________________ Franchot Gallo, MD    JO/MEDQ  D:  04/06/2011  T:  04/06/2011  Job:  [id number]  Electronically Signed by Limmie PatriciaP. on 04/07/2011 01:12:00 PM Electronically Signed by Franchot Gallo MD on 04/07/2011 01:40:09 PM

## 2011-04-12 LAB — GLUCOSE, CAPILLARY
Glucose-Capillary: 145 mg/dL — ABNORMAL HIGH (ref 70–99)
Glucose-Capillary: 101 mg/dL — ABNORMAL HIGH (ref 70–99)
Glucose-Capillary: 139 mg/dL — ABNORMAL HIGH (ref 70–99)

## 2011-04-13 NOTE — Discharge Summary (Signed)
NAME:  Charles Mathews, Charles Mathews NO.:  [id number]  MEDICAL RECORD NO.:  [id number]  LOCATION:  0506                          FACILITY:  [facility]  PHYSICIAN:  Franchot Gallo, MD     DATE OF BIRTH:  [birth year]  DATE OF ADMISSION:  04/05/2011 DATE OF DISCHARGE:  04/08/2011                              DISCHARGE SUMMARY   REASON FOR ADMISSION:  This is a transfer from the medical floor after the patient was assessed for a lung mass.  He also was reporting depressive symptoms and suicidal thoughts and was assessed at [facility] for further evaluation.  FINAL IMPRESSION:  AXIS I:  Major depressive disorder recurrent, severe. AXIS II:  Deferred. AXIS III:  History of right lung mass chronic, probable benign. Hypertension, gastroesophageal reflux disease and morbid obesity. AXIS IV:  Medical problems and poor social support. AXIS V:  GAF at discharge is 65.  SIGNIFICANT FINDINGS:  The patient was admitted to the adult milieu.  We reviewed his home medications.  We discussed the possibility of him living in assisted living facility but he was reporting he would rather return home.  He was reporting decreased sleep, decreased appetite, having mild depressive symptoms, rating 7 on a scale of 1-10.  Denied any suicidal thoughts.  We had contact with Marylene Buerger, his friend, to address any safety issues and to provide information.  He continues to endorse decreased sleep with frequent awakenings, having a good appetite, having mild depressive symptoms rating it 4 on a scale of 1- 10.  He was having episodic suicidal thoughts and endorsing "bad voices" telling him to commit suicide.  He was feeling "weird" on the Neurontin and that was discontinued.  We did add Klonopin at that time for anxiety, increased his Invega to address his psychotic symptoms.  On day of discharge the patient's sleep was good.  Appetite was good, having mild depressive symptoms rating 2 on a scale of  1-10.  Denied any suicidal or homicidal thoughts or psychotic symptoms rating his anxiety a 1-2 on a scale of 1-10, rating his hopelessness a 2 on a scale of 1- 10.  He was seen in the interdisciplinary treatment team.  He was bright and fully alert and ready to go home.  We called in prescriptions to [facility] for bubble packs to dose his medication safely.  DISCHARGE MEDICATIONS: 1. Include Klonopin 0.5 mg 1 tablet t.i.d. 2. Zoloft 50 mg one daily. 3. Topamax 25 mg b.i.d. 4. Invega 9 mg daily. 5. Albuterol inhaler as needed. 6. Amoxapine 50 mg two q.a.m. 7. benztropine 1 mg daily. 8. Doxazosin 2 mg every morning. 9. Metoprolol 50 mg every morning. 10.Omeprazole 20 mg daily. 11.Simvastatin 40 mg daily. 12.Eyedrops as needed p.r.n. 13.Zetia 10 mg daily. 14.The patient was to stop taking gabapentin.  FOLLOWUP:  His medical appointments with Dr. Sandrea Hughs at [facility] of [phone] on September 19 at 9:45 and his psychiatric appointment was with [facility], phone number [phone].  The patient was also being referred for community support team.     Landry Corporal, N.P.   ______________________________ Franchot Gallo, MD    JO/MEDQ  D:  04/08/2011  T:  04/08/2011  Job:  [id number]  Electronically Signed by Limmie Patricia.P. on 04/12/2011 09:16:46 AM Electronically Signed by Franchot Gallo MD on 04/13/2011 04:24:45 PM

## 2011-04-17 NOTE — Discharge Summary (Signed)
  NAME:  Charles Mathews, Charles Mathews NO.:  [id number]  MEDICAL RECORD NO.:  [id number]  LOCATION:  1438                         FACILITY:  [facility]  PHYSICIAN:  Conley Canal, MD      DATE OF BIRTH:  Jun 15, 1961  DATE OF ADMISSION:  04/01/2011 DATE OF DISCHARGE:  04/05/2011                        DISCHARGE SUMMARY - REFERRING   ADDENDUM:  [name] was discharged on September 4th after being seen by Psychiatry to be admitted to the [facility].     Conley Canal, MD     SR/MEDQ  D:  04/14/2011  T:  04/14/2011  Job:  [id number]  Electronically Signed by Conley Canal  on 04/17/2011 03:56:29 PM

## 2011-04-19 ENCOUNTER — Encounter: Payer: Self-pay | Admitting: Internal Medicine

## 2011-04-20 ENCOUNTER — Inpatient Hospital Stay: Payer: PRIVATE HEALTH INSURANCE | Admitting: Internal Medicine

## 2012-01-30 DIAGNOSIS — R079 Chest pain, unspecified: Secondary | ICD-10-CM

## 2012-10-10 ENCOUNTER — Other Ambulatory Visit (HOSPITAL_COMMUNITY): Payer: Self-pay | Admitting: Oral Surgery

## 2012-10-29 NOTE — Pre-Procedure Instructions (Signed)
Charles Mathews  10/29/2012   Your procedure is scheduled on: Monday, April 7th   Report to [facility] at 6:00 AM, this is per your             Surgeon's request. .  Call this number if you have problems the morning of surgery: [phone]   Remember:   Do not eat food or drink liquids after midnight Sunday.   Take these medicines the morning of surgery with A SIP OF WATER: Metoprolol, Omeprazole, Albuterol Inhaler, Invega    Do not wear jewelry.  Do not wear lotions, powders, or colognes. You may NOT wear deodorant.   Men may shave face and neck.   Do not bring valuables to the hospital.  Contacts, dentures or bridgework may not be worn into surgery.   Patients discharged the day of surgery will not be allowed to drive  Home, and someone will need to stay with you for 24 hrs afterwards.   Name and phone number of your driver:    Special Instructions: Shower using CHG 2 nights before surgery and the night before surgery.  If you shower the day of surgery use CHG.  Use special wash - you have one bottle of CHG for all showers.  You should use approximately 1/3 of the bottle for each shower.   Please read over the following fact sheets that you were given: Coughing and Deep Breathing, MRSA Information and Surgical Site Infection Prevention

## 2012-10-30 ENCOUNTER — Encounter (HOSPITAL_COMMUNITY)
Admission: RE | Admit: 2012-10-30 | Discharge: 2012-10-30 | Disposition: A | Discharge status: Home / Self Care | Payer: Medicare Other | Source: Ambulatory Visit | Attending: Oral Surgery | Admitting: Oral Surgery

## 2012-10-30 ENCOUNTER — Encounter (HOSPITAL_COMMUNITY): Payer: Self-pay

## 2012-10-30 DIAGNOSIS — I2510 Atherosclerotic heart disease of native coronary artery without angina pectoris: Secondary | ICD-10-CM | POA: Insufficient documentation

## 2012-10-30 DIAGNOSIS — Z01818 Encounter for other preprocedural examination: Secondary | ICD-10-CM | POA: Insufficient documentation

## 2012-10-30 DIAGNOSIS — F411 Generalized anxiety disorder: Secondary | ICD-10-CM | POA: Insufficient documentation

## 2012-10-30 DIAGNOSIS — Z87442 Personal history of urinary calculi: Secondary | ICD-10-CM | POA: Insufficient documentation

## 2012-10-30 DIAGNOSIS — E119 Type 2 diabetes mellitus without complications: Secondary | ICD-10-CM | POA: Insufficient documentation

## 2012-10-30 DIAGNOSIS — E7800 Pure hypercholesterolemia, unspecified: Secondary | ICD-10-CM | POA: Insufficient documentation

## 2012-10-30 DIAGNOSIS — F329 Major depressive disorder, single episode, unspecified: Secondary | ICD-10-CM | POA: Insufficient documentation

## 2012-10-30 DIAGNOSIS — Z01812 Encounter for preprocedural laboratory examination: Secondary | ICD-10-CM | POA: Insufficient documentation

## 2012-10-30 DIAGNOSIS — Z01810 Encounter for preprocedural cardiovascular examination: Secondary | ICD-10-CM | POA: Insufficient documentation

## 2012-10-30 DIAGNOSIS — I10 Essential (primary) hypertension: Secondary | ICD-10-CM | POA: Insufficient documentation

## 2012-10-30 DIAGNOSIS — F3289 Other specified depressive episodes: Secondary | ICD-10-CM | POA: Insufficient documentation

## 2012-10-30 HISTORY — DX: Chronic kidney disease, unspecified: N18.9

## 2012-10-30 LAB — CBC
Hemoglobin: 13.3 g/dL (ref 13.0–17.0)
MCHC: 33 g/dL (ref 30.0–36.0)
RDW: 13.4 % (ref 11.5–15.5)

## 2012-10-30 LAB — BASIC METABOLIC PANEL
BUN: 13 mg/dL (ref 6–23)
GFR calc Af Amer: >90 mL/min (ref >90)
GFR calc non Af Amer: >90 mL/min (ref >90)
Potassium: 4.2 mEq/L (ref 3.5–5.1)

## 2012-10-31 ENCOUNTER — Encounter (HOSPITAL_COMMUNITY): Payer: Self-pay | Admitting: Vascular Surgery

## 2012-10-31 NOTE — Progress Notes (Addendum)
Anesthesia Chart Review:  Patient is a 52 year old male scheduled for multiple teeth extraction, alveoloplasty on 11/05/12 by Charles Mathews.  Documented history includes non-smoker, HTN, CAD (not specified), DM2, hypercholesterolemia, anxiety and depression (Schizoaffective disorder by previous records), kidney stones s/p lithotripsy, morbid obesity.  PCP is Charles Mathews at [facility].  EKG on 10/30/12 showed NSR.  Preoperative labs noted.    CXR on 10/30/12 showed: Again identified right paramediastinal mass measuring 4.3 x 4.2 x 3.5 cm, increased in size, previously measuring 3.7 x 3.6 x 3.5 cm on 06/25/2011.  Mild enlargement of cardiac silhouette. Metallic foreign body on frontal view not localized on lateral view presumed external artifact.  CTA of the chest on 03/31/11 showed: Base of right upper lobe medial aspect mass with incomplete internal calcification. Primary differential considerations include hamartoma, remote hematoma, less likely paraganglioma, less likely incompletely thrombosed venous varix, solitary metastasis or bronchogenic carcinoma. PET CT is recommended as an outpatient for further characterization and to determine potential need for biopsy. No acute cardiopulmonary process. These results were called by telephone on 04/01/2011 at 12:45 a.m. to Charles Mathews, who verbally acknowledged these results.  A discharge summary dated 04/01/11 mentions pulmonology referral to Charles Mathews, but [facility] reports patient was a no show.  I have called his CXR report to Charles Mathews at Charles Mathews office.  I also attempted to speak with patient's PCP Charles Mathews to clarify patient's "CAD" history and if patient has had any follow-up regarding his pulmonary mass, but Charles Mathews is out of the office this afternoon.  Charles Mathews will have him review tomorrow.  I'll await additional input from Charles Mathews or his nurse.  I also left a message for Charles Mathews to contact me.    Charles Mathews [facility]Anesthesiology Phone [phone] 10/31/2012 4:01 PM  Addendum: 11/01/12 1630 I spoke with Charles Mathews earlier today.  He denies known history of CAD/MI/CHF.  He does have chronic DOE with activities such as walking up stairs which he felt was unchanged for the past several years.  He does use a daily inhaler.  He denies chest pain.  He reports intermittent mild LE edema.  Denies hemoptysis. He denies ever seeing a pulmonologist, but is aware that he has had an abnormal CXR in the past.  He thought he had been told his CXR had improved.  I was also able to speak with Charles Mathews today.  To his knowledge, patient has no known CAD/CHF or dysrhythmia history.  He reports that patient was first noted to have an abnormal CXR ~ 2006/2007.  Around 2011/2012, patient began having more routine follow-up with serial CXRs and/or chest CT scans.  According to Charles Mathews, prior to patient's 10/30/12 CXR, the mass had been evaluated in January 2013 at [facility].  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  However, with patient's smoking history and now possible enlargement by CXR (not CT), Charles Mathews does recommend pulmonology evaluation.  He felt it could likely be done post-operatively; however, I reviewed with anesthesiologist Charles Mathews who felt it would be best to have patient evaluated by pulmonology first.  I have notified Charles Mathews at Charles Mathews office and Charles Mathews at Charles Mathews office . Charles Mathews will contact the patient regarding rescheduling surgery.

## 2012-11-01 ENCOUNTER — Encounter (HOSPITAL_COMMUNITY): Payer: Self-pay | Admitting: *Deleted

## 2012-11-01 NOTE — H&P (Signed)
HISTORY AND PHYSICAL  Charles Mathews is a 52 y.o. male patient with CC: Painful teeth.  No diagnosis found.  Past Medical History  Diagnosis Date  . Hypertension   . Neuropathy   . High cholesterol   . Coronary artery disease   . Diabetes mellitus   . Anxiety   . Major depression   . Lung mass   . Morbid obesity   . Chronic kidney disease     kidney stones    No current facility-administered medications for this encounter.   Current Outpatient Prescriptions  Medication Sig Dispense Refill  . ALBUTEROL IN Inhale 2 puffs into the lungs daily.        Marland Kitchen amoxapine (ASENDIN) 50 MG tablet Take 100 mg by mouth at bedtime.        . benztropine (COGENTIN) 1 MG tablet Take 1 mg by mouth at bedtime.        Marland Kitchen doxazosin (CARDURA) 2 MG tablet Take 2 mg by mouth at bedtime.        Marland Kitchen ezetimibe (ZETIA) 10 MG tablet Take 10 mg by mouth at bedtime.        . gabapentin (NEURONTIN) 300 MG capsule Take 300 mg by mouth at bedtime.        . metoprolol (LOPRESSOR) 50 MG tablet Take 50 mg by mouth 2 (two) times daily.        Marland Kitchen omeprazole (PRILOSEC) 20 MG capsule Take 20 mg by mouth daily.        . paliperidone (INVEGA) 6 MG 24 hr tablet Take 6 mg by mouth every morning.        Bertram Gala Glycol-Propyl Glycol (SYSTANE OP) Apply 1 drop to eye daily as needed.        . simvastatin (ZOCOR) 40 MG tablet Take 40 mg by mouth at bedtime.        . topiramate (TOPAMAX) 25 MG tablet Take 25 mg by mouth 2 (two) times daily.         Allergies  Allergen Reactions  . Bee Venom Anaphylaxis  . Lorazepam   . Penicillins Other (See Comments)    CHILDHOOD ALLERGY   Active Problems:   * No active hospital problems. *  Vitals: There were no vitals taken for this visit. Lab results:No results found for this or any previous visit (from the past 24 hour(s)). Radiology Results: No results found. General appearance: alert, cooperative and morbidly obese Head: Normocephalic, without obvious abnormality,  atraumatic Eyes: negative Ears: normal TM's and external ear canals both ears Nose: Nares normal. Septum midline. Mucosa normal. No drainage or sinus tenderness. Throat: Dental caries teeth #'s 5, 6, 7, 8, 9, 10, 11, 12, 14, 15, 18, 27, 28, 29, 30, 31, 32 Neck: no adenopathy, supple, symmetrical, trachea midline and thyroid not enlarged, symmetric, no tenderness/mass/nodules Resp: clear to auscultation bilaterally Cardio: regular rate and rhythm, S1, S2 normal, no murmur, click, rub or gallop  Assessment:52 WM HTN, DM, CAD, Morbid Obesity, Major depression, Lung Mass with non-restorable teeth #'s 5, 6, 7, 8, 9, 10, 11, 12, 14, 15, 18, 27, 28, 29, 30, 31, 32  Plan: Unclear as to whether lung mass has had medical work-up. Awaiting information from [facility], PA-C.   If work up negative, plan extraction teeth #'s 5, 6, 7, 8, 9, 10, 11, 12, 14, 15, 18, 27, 28, 29, 30, 31, 32 with alveoloplasty. General anesthesia. Day surgery.   Charles Mathews 11/01/2012

## 2012-11-02 ENCOUNTER — Encounter (HOSPITAL_COMMUNITY): Payer: Self-pay

## 2012-11-05 ENCOUNTER — Ambulatory Visit (HOSPITAL_COMMUNITY): Admission: RE | Admit: 2012-11-05 | Payer: PRIVATE HEALTH INSURANCE | Source: Ambulatory Visit | Admitting: Oral Surgery

## 2012-11-05 ENCOUNTER — Encounter (HOSPITAL_COMMUNITY): Admission: RE | Payer: Self-pay | Source: Ambulatory Visit

## 2012-11-05 SURGERY — MULTIPLE EXTRACTION WITH ALVEOLOPLASTY
Anesthesia: General | Site: Mouth | Laterality: Bilateral

## 2012-11-12 ENCOUNTER — Telehealth: Payer: Self-pay | Admitting: Pulmonary Disease

## 2012-11-12 ENCOUNTER — Ambulatory Visit (INDEPENDENT_AMBULATORY_CARE_PROVIDER_SITE_OTHER): Payer: Medicare Other | Admitting: Pulmonary Disease

## 2012-11-12 ENCOUNTER — Encounter: Payer: Self-pay | Admitting: Pulmonary Disease

## 2012-11-12 VITALS — BP 122/78 | HR 55 | Temp 99.1°F | Ht 69.0 in | Wt 301.0 lb

## 2012-11-12 DIAGNOSIS — R222 Localized swelling, mass and lump, trunk: Secondary | ICD-10-CM

## 2012-11-12 DIAGNOSIS — R0602 Shortness of breath: Secondary | ICD-10-CM

## 2012-11-12 DIAGNOSIS — R918 Other nonspecific abnormal finding of lung field: Secondary | ICD-10-CM | POA: Insufficient documentation

## 2012-11-12 NOTE — Progress Notes (Signed)
Subjective:    Patient ID: Charles Mathews, male    DOB: [birth year], 52 y.o.   MRN: [id number]  HPI Charles Mathews is a 52 year old male who comes to our clinic today for a perioperative pulmonary risk stratification as well as evaluation of a right hilar mass. According to clinic notes he has had an abnormal x-ray since 2006 but he tells me that the first he heard of the lung mass was 2 years ago after a hospitalization. Clinic notes also state that his primary care physician has been following the mass at [facility] with serial imaging, but Charles Mathews does not recall this. He was supposed to undergo complete tooth extraction under anesthesia but during his evaluation this right hilar mass was discovered again and the anesthesiologist recommended that he come to [place] prior to surgery. He tells me that he gets short of breath when climbing a flight of stairs and sometimes has to stop. He frequently has a dry cough. He notes some sinus congestion occasionally. He states he gets bronchitis about once a year. He previously use to smoke heavily and quit approximately 4 years ago. He states that he thinks he has asthma and sometimes has "a rattling" in his chest. He uses Advair twice a day and albuterol which he says helps with his breathing. He does not exercise regularly. He states that his weight has been stable, he does not have chest pain, and he does not have hemoptysis.  Past Medical History  Diagnosis Date  . Hypertension   . Neuropathy   . High cholesterol   . Diabetes mellitus   . Anxiety   . Major depression   . Lung mass   . Morbid obesity   . Chronic kidney disease     kidney stones  . Coronary artery disease     11/01/12 he denies known CAD/MI/CHF history     Family History  Problem Relation Age of Onset  . Adopted: Yes     History   Social History  . Marital Status: Single    Spouse Name: N/A    Number of Children: N/A  . Years of Education: N/A   Occupational History  .  Not on file.   Social History Main Topics  . Smoking status: Former Smoker -- 2.00 packs/day for 27 years    Types: Cigarettes    Quit date: 08/01/2002  . Smokeless tobacco: Never Used  . Alcohol Use: No  . Drug Use: No  . Sexually Active: No   Other Topics Concern  . Not on file   Social History Narrative  . No narrative on file     Allergies  Allergen Reactions  . Bee Venom Anaphylaxis  . Lorazepam Other (See Comments)    Makes him feel woozy.  Marland Kitchen Penicillins Other (See Comments)    CHILDHOOD ALLERGY     Outpatient Prescriptions Prior to Visit  Medication Sig Dispense Refill  . albuterol (PROVENTIL HFA;VENTOLIN HFA) 108 (90 BASE) MCG/ACT inhaler Inhale 2 puffs into the lungs every 6 (six) hours as needed for wheezing.      . benztropine (COGENTIN) 1 MG tablet Take 1 mg by mouth at bedtime.        Marland Kitchen doxazosin (CARDURA) 2 MG tablet Take 2 mg by mouth every morning.       . ezetimibe (ZETIA) 10 MG tablet Take 10 mg by mouth at bedtime.        . Fluticasone-Salmeterol (ADVAIR) 100-50 MCG/DOSE AEPB Inhale 1 puff  into the lungs every 12 (twelve) hours.      Marland Kitchen ibuprofen (ADVIL,MOTRIN) 200 MG tablet Take 200 mg by mouth every 6 (six) hours as needed for pain.      . metoprolol (LOPRESSOR) 50 MG tablet Take 50 mg by mouth 2 (two) times daily.        Marland Kitchen omeprazole (PRILOSEC) 20 MG capsule Take 20 mg by mouth daily.        . paliperidone (INVEGA) 3 MG 24 hr tablet Take 3 mg by mouth every morning.      . ranitidine (ZANTAC) 150 MG tablet Take 150 mg by mouth 2 (two) times daily.      . sertraline (ZOLOFT) 100 MG tablet Take 200 mg by mouth daily.      . simvastatin (ZOCOR) 40 MG tablet Take 40 mg by mouth at bedtime.        . topiramate (TOPAMAX) 50 MG tablet Take 50 mg by mouth 2 (two) times daily.      Marland Kitchen venlafaxine XR (EFFEXOR-XR) 150 MG 24 hr capsule Take 150 mg by mouth 2 (two) times daily. One each morning and one daily at noon       No facility-administered medications prior  to visit.      Review of Systems  Constitutional: Negative for fever and unexpected weight change.  HENT: Positive for ear pain, trouble swallowing and dental problem. Negative for nosebleeds, congestion, sore throat, rhinorrhea, sneezing, postnasal drip and sinus pressure.   Eyes: Negative for redness and itching.  Respiratory: Positive for shortness of breath. Negative for cough, chest tightness and wheezing.   Cardiovascular: Negative for palpitations and leg swelling.  Gastrointestinal: Positive for abdominal pain. Negative for nausea and vomiting.  Genitourinary: Negative for dysuria.  Musculoskeletal: Negative for joint swelling.  Skin: Negative for rash.  Neurological: Negative for headaches.  Hematological: Does not bruise/bleed easily.  Psychiatric/Behavioral: Positive for dysphoric mood. The patient is nervous/anxious.        Objective:   Physical Exam Filed Vitals:   11/12/12 1112  BP: 122/78  Pulse: 55  Temp: 99.1 F (37.3 C)  TempSrc: Oral  Height: 5\' 9"  (1.753 m)  Weight: 301 lb (136.533 kg)  SpO2: 92%   Gen: obese, no acute distress HEENT: NCAT, PERRL, EOMi, OP clear, neck supple without masses PULM: CTA B CV: RRR, no mgr, no JVD AB: BS+, soft, nontender, no hsm Ext: warm, no edema, no clubbing, no cyanosis Derm: no rash or skin breakdown Neuro: A&Ox4, CN II-XII intact, strength 5/5 in all 4 extremities  10/2012 CXR > 4.3 x 4.2 x 3.5 cm mass, slightly increased in size compared to the 2012 CT chest 2012 CT chest > RUL medial aspect mass with internal calcifications, question hamartoma, prior hematoma, less likely paraganglioma or bronchogenic carcinoma     Assessment & Plan:   Shortness of breath Charles Mathews dyspnea is most likely due to his obesity and deconditioning.  His recent CXR did not show interstitial changes, and his recent EKG and CBC was normal.  I cannot interpret his spirometry today because he had difficulty with the test.  That said, I  did not see convincing evidence of severe airway obstruction.  Because of his prior heavy tobacco use I am reluctant to stop the Advair prior to his upcoming surgery, especially since I cannot rule in or out COPD from spirometry today.  There is nothing on today's visit that suggests that his peri-operative pulmonary risk is too high for his  planned dental procedure.  Plan: -proceed with surgery -continue Advair for now, but consider stopping it post surgery  Lung mass I agree with the differential diagnosis from radiology's read of his CT scan from 2012.  There is calcium in the mass which is encouraging that it could be benign. However, it is concerning that the mass was not seen on 2010 chest x-rays and even more concerning that it has increased in size since 2012.    At this point a PET CT would be helpful to better characterize the lesion.  I explained to him that he may ultimately need a biopsy given his prior heavy smoking history.   Updated Medication List Outpatient Encounter Prescriptions as of 11/12/2012  Medication Sig Dispense Refill  . albuterol (PROVENTIL HFA;VENTOLIN HFA) 108 (90 BASE) MCG/ACT inhaler Inhale 2 puffs into the lungs every 6 (six) hours as needed for wheezing.      . benztropine (COGENTIN) 1 MG tablet Take 1 mg by mouth at bedtime.        Marland Kitchen doxazosin (CARDURA) 2 MG tablet Take 2 mg by mouth every morning.       . ezetimibe (ZETIA) 10 MG tablet Take 10 mg by mouth at bedtime.        . Fluticasone-Salmeterol (ADVAIR) 100-50 MCG/DOSE AEPB Inhale 1 puff into the lungs every 12 (twelve) hours.      Marland Kitchen ibuprofen (ADVIL,MOTRIN) 200 MG tablet Take 200 mg by mouth every 6 (six) hours as needed for pain.      . metoprolol (LOPRESSOR) 50 MG tablet Take 50 mg by mouth 2 (two) times daily.        Marland Kitchen omeprazole (PRILOSEC) 20 MG capsule Take 20 mg by mouth daily.        . paliperidone (INVEGA) 3 MG 24 hr tablet Take 3 mg by mouth every morning.      . ranitidine (ZANTAC) 150  MG tablet Take 150 mg by mouth 2 (two) times daily.      . sertraline (ZOLOFT) 100 MG tablet Take 200 mg by mouth daily.      . simvastatin (ZOCOR) 40 MG tablet Take 40 mg by mouth at bedtime.        . topiramate (TOPAMAX) 50 MG tablet Take 50 mg by mouth 2 (two) times daily.      Marland Kitchen venlafaxine XR (EFFEXOR-XR) 150 MG 24 hr capsule Take 150 mg by mouth 2 (two) times daily. One each morning and one daily at noon       No facility-administered encounter medications on file as of 11/12/2012.

## 2012-11-12 NOTE — Assessment & Plan Note (Signed)
I agree with the differential diagnosis from radiology's read of his CT scan from 2012.  There is calcium in the mass which is encouraging that it could be benign. However, it is concerning that the mass was not seen on 2010 chest x-rays and even more concerning that it has increased in size since 2012.    At this point a PET CT would be helpful to better characterize the lesion.  I explained to him that he may ultimately need a biopsy given his prior heavy smoking history.

## 2012-11-12 NOTE — Patient Instructions (Signed)
Keep using your Advair as you are doing We will set up a PET scan of your lungs to look at the lung mass. We will see you back in the office in the next three to four weeks

## 2012-11-12 NOTE — Assessment & Plan Note (Signed)
Mr. Curto dyspnea is most likely due to his obesity and deconditioning.  His recent CXR did not show interstitial changes, and his recent EKG and CBC was normal.  I cannot interpret his spirometry today because he had difficulty with the test.  That said, I did not see convincing evidence of severe airway obstruction.  Because of his prior heavy tobacco use I am reluctant to stop the Advair prior to his upcoming surgery, especially since I cannot rule in or out COPD from spirometry today.  There is nothing on today's visit that suggests that his peri-operative pulmonary risk is too high for his planned dental procedure.  Plan: -proceed with surgery -continue Advair for now, but consider stopping it post surgery

## 2012-11-12 NOTE — Telephone Encounter (Signed)
I spoke with [facility] with ACT services, she accompanied the pt to his appt today and helps with his medications and appts, I also spoke with Sedalia Muta, RN for ACT services. They need Dr. Corey Skains recs as far as the pt dental surgery. I advised I can fax what he has put in OV note. Dr. Corey Skains recs are as follows:  Shortness of breath - Lupita Leash, MD at 11/12/2012 12:38 PM    Status: Written Related Problem: Shortness of breath           Mr. Meisenheimer dyspnea is most likely due to his obesity and deconditioning.  His recent CXR did not show interstitial changes, and his recent EKG and CBC was normal.  I cannot interpret his spirometry today because he had difficulty with the test.  That said, I did not see convincing evidence of severe airway obstruction.   Because of his prior heavy tobacco use I am reluctant to stop the Advair prior to his upcoming surgery, especially since I cannot rule in or out COPD from spirometry today.   There is nothing on today's visit that suggests that his peri-operative pulmonary risk is too high for his planned dental procedure.   Plan: -proceed with surgery -continue Advair for now, but consider stopping it post surgery     Diane requests this be faxed to Dr. Ocie Doyne at [phone]. Phone note faxed. Carron Curie, CMA

## 2012-11-15 ENCOUNTER — Encounter (HOSPITAL_COMMUNITY)
Admission: RE | Admit: 2012-11-15 | Discharge: 2012-11-15 | Disposition: A | Discharge status: Home / Self Care | Payer: Medicare Other | Source: Ambulatory Visit | Attending: Pulmonary Disease | Admitting: Pulmonary Disease

## 2012-11-15 DIAGNOSIS — J984 Other disorders of lung: Secondary | ICD-10-CM | POA: Insufficient documentation

## 2012-11-15 DIAGNOSIS — R222 Localized swelling, mass and lump, trunk: Secondary | ICD-10-CM | POA: Insufficient documentation

## 2012-11-15 DIAGNOSIS — R918 Other nonspecific abnormal finding of lung field: Secondary | ICD-10-CM

## 2012-11-15 LAB — GLUCOSE, CAPILLARY: Glucose-Capillary: 96 mg/dL (ref 70–99)

## 2012-11-15 MED ORDER — FLUDEOXYGLUCOSE F - 18 (FDG) INJECTION
18.2000 | Freq: Once | INTRAVENOUS | Status: AC | PRN
  Administered 2012-11-15: 18.2 via INTRAVENOUS

## 2012-12-03 ENCOUNTER — Encounter: Payer: Self-pay | Admitting: Pulmonary Disease

## 2012-12-03 ENCOUNTER — Ambulatory Visit (INDEPENDENT_AMBULATORY_CARE_PROVIDER_SITE_OTHER): Payer: Medicare Other | Admitting: Pulmonary Disease

## 2012-12-03 ENCOUNTER — Ambulatory Visit (INDEPENDENT_AMBULATORY_CARE_PROVIDER_SITE_OTHER)
Admission: RE | Admit: 2012-12-03 | Discharge: 2012-12-03 | Disposition: A | Discharge status: Home / Self Care | Payer: Medicare Other | Source: Ambulatory Visit | Attending: Pulmonary Disease | Admitting: Pulmonary Disease

## 2012-12-03 VITALS — BP 130/80 | HR 68 | Ht 69.0 in | Wt 299.8 lb

## 2012-12-03 DIAGNOSIS — R0602 Shortness of breath: Secondary | ICD-10-CM

## 2012-12-03 DIAGNOSIS — J309 Allergic rhinitis, unspecified: Secondary | ICD-10-CM

## 2012-12-03 DIAGNOSIS — J209 Acute bronchitis, unspecified: Secondary | ICD-10-CM

## 2012-12-03 DIAGNOSIS — R918 Other nonspecific abnormal finding of lung field: Secondary | ICD-10-CM

## 2012-12-03 DIAGNOSIS — R222 Localized swelling, mass and lump, trunk: Secondary | ICD-10-CM

## 2012-12-03 MED ORDER — PREDNISONE 10 MG PO TABS: ORAL_TABLET | ORAL | Status: DC

## 2012-12-03 MED ORDER — BENZONATATE 200 MG PO CAPS: 200.0000 mg | ORAL_CAPSULE | Freq: Three times a day (TID) | ORAL | Status: DC | PRN

## 2012-12-03 NOTE — Assessment & Plan Note (Signed)
I am encouraged by the radiology report stating that this lesion looks most like a hamartoma.  Given that, we will continue watchful waiting at this point.  Given the RLL nodule seen on the CT PET, we will repeat a CT scan in 6 months time and will evaluate the nodule and the mass for any interval change.

## 2012-12-03 NOTE — Progress Notes (Signed)
Quick Note:  ATC patient. No answer, LMOMTCB ______

## 2012-12-03 NOTE — Patient Instructions (Signed)
Take the antibiotic as you are doing  Take the prednisone as written  Take the tessalon perles as needed for cough  Use Claritin, Zyrtec, or Allegra as needed for your sinus congestion and allergies (their generic form)  We will see you back in 6 months or sooner if needed

## 2012-12-03 NOTE — Assessment & Plan Note (Addendum)
He is currently being treated for what sounds like acute bronchitis.  He is wheezing slightly on exam.  Plan: -CXR today -prednisone taper -continue home antibiotics -tessalon perles prn, mucinex prn -f/u with [place] or PCP if no improvement

## 2012-12-03 NOTE — Progress Notes (Signed)
Quick Note:  Spoke with patient, informed him of results and rescs as listed below per BQ. Verbalized understanding and nothing further needed at this time. ______

## 2012-12-03 NOTE — Progress Notes (Signed)
Subjective:    Patient ID: Charles Mathews, male    DOB: [birth year], 52 y.o.   MRN: [id number]  Synopsis: Charles Mathews first saw the [facility] in April 2014 for a RLL mass seen on multiple CXRs in the past.  He also carried a diagnosis of asthma but had no clear obstruction on PFT's performed in clinic that day.  He had a PET/CT scan ordered to follow up the mass did not show that the mass was FDG Avid  HPI  12/03/2012 ROV -- Charles Mathews hasn't had his teeth pulled yet.  He states that two weeks ago he had to go to the hospital again two weeks ago for fever 101, bad cough, and some shortness of breath.  He continues to have cough productive of sputum, and says that things are a little better.  He is not taking any cough syrup or mucinex.  He lives by himself and has people check on him every so often.     Past Medical History  Diagnosis Date  . Hypertension   . Neuropathy   . High cholesterol   . Diabetes mellitus   . Anxiety   . Major depression   . Lung mass   . Morbid obesity   . Chronic kidney disease     kidney stones  . Coronary artery disease     11/01/12 he denies known CAD/MI/CHF history      Review of Systems  Constitutional: Negative for fever, chills and fatigue.  HENT: Positive for sneezing, postnasal drip and sinus pressure.   Respiratory: Positive for cough, shortness of breath and wheezing.   Cardiovascular: Negative for chest pain, palpitations and leg swelling.       Objective:   Physical Exam  Filed Vitals:   12/03/12 1118  BP: 130/80  Pulse: 68  Height: 5\' 9"  (1.753 m)  Weight: 299 lb 12.8 oz (135.988 kg)  SpO2: 99%    Gen: obese, disheveled, no acute distress HEENT: NCAT,  OP clear,  PULM: few exp wheezes in bases bilaterally CV: RRR, no mgr, no JVD AB: BS+, soft, nontender, no hsm Ext: warm, no edema, no clubbing, no cyanosis       Assessment & Plan:   Lung mass I am encouraged by the radiology report stating that this lesion looks  most like a hamartoma.  Given that, we will continue watchful waiting at this point.  Given the RLL nodule seen on the CT PET, we will repeat a CT scan in 6 months time and will evaluate the nodule and the mass for any interval change.  Acute bronchitis He is currently being treated for what sounds like acute bronchitis.  He is wheezing slightly on exam.  Plan: -CXR today -prednisone taper -continue home antibiotics -tessalon perles prn, mucinex prn -f/u with [place] or PCP if no improvement  Allergic rhinitis This is likely contributing to his cough.  Plan: -add claritin daily    Updated Medication List Outpatient Encounter Prescriptions as of 12/03/2012  Medication Sig Dispense Refill  . albuterol (PROVENTIL HFA;VENTOLIN HFA) 108 (90 BASE) MCG/ACT inhaler Inhale 2 puffs into the lungs every 6 (six) hours as needed for wheezing.      Marland Kitchen albuterol (PROVENTIL) (2.5 MG/3ML) 0.083% nebulizer solution Take 2.5 mg by nebulization every 6 (six) hours as needed for wheezing.      . benztropine (COGENTIN) 1 MG tablet Take 1 mg by mouth at bedtime.        Marland Kitchen doxazosin (CARDURA)  2 MG tablet Take 2 mg by mouth every morning.       Marland Kitchen doxycycline (DORYX) 100 MG EC tablet Take 100 mg by mouth 2 (two) times daily.      Marland Kitchen ezetimibe (ZETIA) 10 MG tablet Take 10 mg by mouth at bedtime.        . Fluticasone-Salmeterol (ADVAIR) 100-50 MCG/DOSE AEPB Inhale 1 puff into the lungs every 12 (twelve) hours.      Marland Kitchen HYDROcodone-homatropine (HYCODAN) 5-1.5 MG/5ML syrup Take 5 mLs by mouth every 6 (six) hours as needed for cough.      Marland Kitchen ibuprofen (ADVIL,MOTRIN) 200 MG tablet Take 200 mg by mouth every 6 (six) hours as needed for pain.      . metoprolol (LOPRESSOR) 50 MG tablet Take 50 mg by mouth 2 (two) times daily.        Marland Kitchen omeprazole (PRILOSEC) 20 MG capsule Take 20 mg by mouth daily.        . paliperidone (INVEGA) 3 MG 24 hr tablet Take 3 mg by mouth every morning.      . ranitidine (ZANTAC) 150 MG tablet Take 150  mg by mouth 2 (two) times daily.      . sertraline (ZOLOFT) 100 MG tablet Take 200 mg by mouth daily.      . simvastatin (ZOCOR) 40 MG tablet Take 40 mg by mouth at bedtime.        . topiramate (TOPAMAX) 50 MG tablet Take 50 mg by mouth 2 (two) times daily.      Marland Kitchen venlafaxine XR (EFFEXOR-XR) 150 MG 24 hr capsule Take 150 mg by mouth 2 (two) times daily. One each morning and one daily at noon      . benzonatate (TESSALON) 200 MG capsule Take 1 capsule (200 mg total) by mouth 3 (three) times daily as needed for cough.  30 capsule  1  . predniSONE (DELTASONE) 10 MG tablet 30mg  daily for 3 days, then 20mg  daily for 3 days, then 10mg  daily for 3 days, then off  30 tablet  0  . [DISCONTINUED] benzonatate (TESSALON) 200 MG capsule Take 1 capsule (200 mg total) by mouth 3 (three) times daily as needed for cough.  30 capsule  1  . [DISCONTINUED] predniSONE (DELTASONE) 10 MG tablet 30mg  daily for 3 days, then 20mg  daily for 3 days, then 10mg  daily for 3 days, then off  30 tablet  0   No facility-administered encounter medications on file as of 12/03/2012.

## 2012-12-03 NOTE — Assessment & Plan Note (Signed)
This is likely contributing to his cough.  Plan: -add claritin daily

## 2012-12-12 ENCOUNTER — Encounter (HOSPITAL_COMMUNITY)
Admission: RE | Admit: 2012-12-12 | Discharge: 2012-12-12 | Disposition: A | Discharge status: Home / Self Care | Payer: PRIVATE HEALTH INSURANCE | Source: Ambulatory Visit | Attending: Oral Surgery | Admitting: Oral Surgery

## 2012-12-12 NOTE — Pre-Procedure Instructions (Signed)
Charles Mathews  12/12/2012   Your procedure is scheduled on:  Monday, May 19th  Report to [facility] at 5:30AM.  Call this number if you have problems the morning of surgery: [phone]   Remember:   Do not eat food or drink liquids after midnight.    Take these medicines the morning of surgery with A SIP OF WATER: doxazosin (CARDURA), doxycycline (DORYX),  metoprolol (LOPRESSOR) omeprazole (PRILOSEC, ranitidine (ZANTAC), sertraline (ZOLOFT,  venlafaxine XR (EFFEXOR-XR) ,prednisone,paliperidone     ZOX:WRUEAVWU, bring ALbuterol inhaler to the hospital with you.  May take:HYDROcodone-homatropine [facility]) if needled.    Stop taking ibuprofen: (ADVIL,MOTRIN)     Do not wear jewelry, make-up or nail polish.  Do not wear lotions, powders, or perfumes. You may wear deodorant.              Men may shave face and neck.  Do not bring valuables to the hospital.  Contacts, dentures or bridgework may not be worn into surgery.  Leave suitcase in the car. After surgery it may be brought to your room.  For patients admitted to the hospital, checkout time is 11:00 AM the day of discharge.   Patients discharged the day of surgery will not be allowed to drive home.  Name and phone number of your driver -   Special Instructions: Shower using CHG 2 nights before surgery and the night before surgery.  If you shower the day of surgery use CHG.  Use special wash - you have one bottle of CHG for all showers.  You should use approximately 1/3 of the bottle for each shower.   Please read over the following fact sheets that you were given: Pain Booklet, Coughing and Deep Breathing and Surgical Site Infection Prevention

## 2012-12-13 NOTE — H&P (Signed)
HISTORY AND PHYSICAL  Charles Mathews is a 52 y.o. male patient with CC: bad teeth.  No diagnosis found.  Past Medical History  Diagnosis Date  . Hypertension   . Neuropathy   . High cholesterol   . Diabetes mellitus   . Anxiety   . Major depression   . Lung mass   . Morbid obesity   . Chronic kidney disease     kidney stones  . Coronary artery disease     11/01/12 he denies known CAD/MI/CHF history    No current facility-administered medications for this encounter.   Current Outpatient Prescriptions  Medication Sig Dispense Refill  . albuterol (PROVENTIL HFA;VENTOLIN HFA) 108 (90 BASE) MCG/ACT inhaler Inhale 2 puffs into the lungs every 6 (six) hours as needed for wheezing.      Marland Kitchen albuterol (PROVENTIL) (2.5 MG/3ML) 0.083% nebulizer solution Take 2.5 mg by nebulization every 6 (six) hours as needed for wheezing.      . benzonatate (TESSALON) 200 MG capsule Take 1 capsule (200 mg total) by mouth 3 (three) times daily as needed for cough.  30 capsule  1  . benztropine (COGENTIN) 1 MG tablet Take 1 mg by mouth at bedtime.        Marland Kitchen doxazosin (CARDURA) 2 MG tablet Take 2 mg by mouth every morning.       Marland Kitchen doxycycline (DORYX) 100 MG EC tablet Take 100 mg by mouth 2 (two) times daily.      Marland Kitchen ezetimibe (ZETIA) 10 MG tablet Take 10 mg by mouth at bedtime.        . Fluticasone-Salmeterol (ADVAIR) 100-50 MCG/DOSE AEPB Inhale 1 puff into the lungs every 12 (twelve) hours.      Marland Kitchen HYDROcodone-homatropine (HYCODAN) 5-1.5 MG/5ML syrup Take 5 mLs by mouth every 6 (six) hours as needed for cough.      Marland Kitchen ibuprofen (ADVIL,MOTRIN) 200 MG tablet Take 200 mg by mouth every 6 (six) hours as needed for pain.      . metoprolol (LOPRESSOR) 50 MG tablet Take 50 mg by mouth 2 (two) times daily.        Marland Kitchen omeprazole (PRILOSEC) 20 MG capsule Take 20 mg by mouth daily.        . paliperidone (INVEGA) 3 MG 24 hr tablet Take 3 mg by mouth every morning.      . predniSONE (DELTASONE) 10 MG tablet 30mg  daily for 3  days, then 20mg  daily for 3 days, then 10mg  daily for 3 days, then off  30 tablet  0  . ranitidine (ZANTAC) 150 MG tablet Take 150 mg by mouth 2 (two) times daily.      . sertraline (ZOLOFT) 100 MG tablet Take 200 mg by mouth daily.      . simvastatin (ZOCOR) 40 MG tablet Take 40 mg by mouth at bedtime.        . topiramate (TOPAMAX) 50 MG tablet Take 50 mg by mouth 2 (two) times daily.      Marland Kitchen venlafaxine XR (EFFEXOR-XR) 150 MG 24 hr capsule Take 150 mg by mouth 2 (two) times daily. One each morning and one daily at noon       Allergies  Allergen Reactions  . Bee Venom Anaphylaxis  . Lorazepam Other (See Comments)    Makes him feel woozy.  Marland Kitchen Penicillins Other (See Comments)    CHILDHOOD ALLERGY   Active Problems:   * No active hospital problems. *  Vitals: There were no vitals taken for this  visit. Lab results:No results found for this or any previous visit (from the past 24 hour(s)). Radiology Results: No results found. General appearance: alert, cooperative and morbidly obese Head: Normocephalic, without obvious abnormality, atraumatic Eyes: negative Ears: normal TM's and external ear canals both ears Nose: Nares normal. Septum midline. Mucosa normal. No drainage or sinus tenderness. Throat: dental caries all remaing teeth Neck: no adenopathy, supple, symmetrical, trachea midline and thyroid not enlarged, symmetric, no tenderness/mass/nodules Resp: clear to auscultation bilaterally Cardio: regular rate and rhythm, S1, S2 normal, no murmur, click, rub or gallop  Assessment: 52 YO WM Morbid obesity, COPD with non-restorable teeth.  Plan: Full mouth dental extractions, alveoloplasty, remove exostosis left mandible. General anesthsia. Day surgery   Charles Mathews M 12/13/2012

## 2012-12-14 ENCOUNTER — Encounter (HOSPITAL_COMMUNITY): Admission: RE | Admit: 2012-12-14 | Payer: Medicare Other | Source: Ambulatory Visit

## 2012-12-14 ENCOUNTER — Encounter (HOSPITAL_COMMUNITY): Payer: Self-pay

## 2012-12-14 ENCOUNTER — Encounter (HOSPITAL_COMMUNITY)
Admission: RE | Admit: 2012-12-14 | Discharge: 2012-12-14 | Disposition: A | Discharge status: Home / Self Care | Payer: PRIVATE HEALTH INSURANCE | Source: Ambulatory Visit | Attending: Oral Surgery | Admitting: Oral Surgery

## 2012-12-14 ENCOUNTER — Other Ambulatory Visit (HOSPITAL_COMMUNITY): Payer: Medicare Other

## 2012-12-14 HISTORY — DX: Insomnia, unspecified: G47.00

## 2012-12-14 HISTORY — DX: Shortness of breath: R06.02

## 2012-12-14 HISTORY — DX: Other chronic pain: G89.29

## 2012-12-14 HISTORY — DX: Dorsalgia, unspecified: M54.9

## 2012-12-14 HISTORY — DX: Migraine, unspecified, not intractable, without status migrainosus: G43.909

## 2012-12-14 HISTORY — DX: Pain in unspecified joint: M25.50

## 2012-12-14 HISTORY — DX: Unspecified asthma, uncomplicated: J45.909

## 2012-12-14 HISTORY — DX: Unspecified osteoarthritis, unspecified site: M19.90

## 2012-12-14 HISTORY — DX: Gastro-esophageal reflux disease without esophagitis: K21.9

## 2012-12-14 HISTORY — DX: Gastric ulcer, unspecified as acute or chronic, without hemorrhage or perforation: K25.9

## 2012-12-14 HISTORY — DX: Frequency of micturition: R35.0

## 2012-12-14 HISTORY — DX: Unspecified convulsions: R56.9

## 2012-12-14 HISTORY — DX: Urgency of urination: R39.15

## 2012-12-14 LAB — BASIC METABOLIC PANEL
Calcium: 8.7 mg/dL (ref 8.4–10.5)
Creatinine, Ser: 1.04 mg/dL (ref 0.50–1.35)
GFR calc non Af Amer: 81 mL/min — ABNORMAL LOW (ref >90)
Sodium: 141 mEq/L (ref 135–145)

## 2012-12-14 LAB — CBC
Platelets: 169 10*3/uL (ref 150–400)
RBC: 4.73 MIL/uL (ref 4.22–5.81)
RDW: 13.3 % (ref 11.5–15.5)
WBC: 11.9 10*3/uL — ABNORMAL HIGH (ref 4.0–10.5)

## 2012-12-14 NOTE — Progress Notes (Signed)
Called Levada Schilling RN @[phone] with time for patient to arrive for surgery 12/17/2012. Patient to arrive at 0530 AM.Informed nurse that patient received an instruction sheet with all instructions for surgery.

## 2012-12-14 NOTE — Progress Notes (Addendum)
Pt doesn't have a cardiologist  Stress test done at [facility] [place] >32yrs ago  Denies ever having an echo or heart cath  Dr.Kevin Dimas Aguas is Medical Md  EKG and CXR reports in epic from 2014

## 2012-12-17 ENCOUNTER — Observation Stay (HOSPITAL_COMMUNITY)
Admission: RE | Admit: 2012-12-17 | Discharge: 2012-12-18 | Disposition: A | Discharge status: Home / Self Care | Payer: PRIVATE HEALTH INSURANCE | Source: Ambulatory Visit | Attending: Oral Surgery | Admitting: Oral Surgery

## 2012-12-17 ENCOUNTER — Encounter (HOSPITAL_COMMUNITY): Payer: Self-pay | Admitting: *Deleted

## 2012-12-17 ENCOUNTER — Encounter (HOSPITAL_COMMUNITY): Payer: Self-pay | Admitting: Anesthesiology

## 2012-12-17 ENCOUNTER — Encounter (HOSPITAL_COMMUNITY)
Admission: RE | Disposition: A | Discharge status: Home / Self Care | Payer: Self-pay | Source: Ambulatory Visit | Attending: Oral Surgery

## 2012-12-17 ENCOUNTER — Ambulatory Visit (HOSPITAL_COMMUNITY): Payer: PRIVATE HEALTH INSURANCE | Admitting: Anesthesiology

## 2012-12-17 DIAGNOSIS — R918 Other nonspecific abnormal finding of lung field: Secondary | ICD-10-CM

## 2012-12-17 DIAGNOSIS — Z01812 Encounter for preprocedural laboratory examination: Secondary | ICD-10-CM | POA: Insufficient documentation

## 2012-12-17 DIAGNOSIS — J449 Chronic obstructive pulmonary disease, unspecified: Secondary | ICD-10-CM | POA: Insufficient documentation

## 2012-12-17 DIAGNOSIS — I2510 Atherosclerotic heart disease of native coronary artery without angina pectoris: Secondary | ICD-10-CM | POA: Insufficient documentation

## 2012-12-17 DIAGNOSIS — J4489 Other specified chronic obstructive pulmonary disease: Secondary | ICD-10-CM | POA: Insufficient documentation

## 2012-12-17 DIAGNOSIS — I10 Essential (primary) hypertension: Secondary | ICD-10-CM | POA: Insufficient documentation

## 2012-12-17 DIAGNOSIS — F329 Major depressive disorder, single episode, unspecified: Secondary | ICD-10-CM | POA: Insufficient documentation

## 2012-12-17 DIAGNOSIS — R0602 Shortness of breath: Secondary | ICD-10-CM

## 2012-12-17 DIAGNOSIS — E119 Type 2 diabetes mellitus without complications: Secondary | ICD-10-CM | POA: Insufficient documentation

## 2012-12-17 DIAGNOSIS — J209 Acute bronchitis, unspecified: Secondary | ICD-10-CM

## 2012-12-17 DIAGNOSIS — J309 Allergic rhinitis, unspecified: Secondary | ICD-10-CM

## 2012-12-17 DIAGNOSIS — K029 Dental caries, unspecified: Principal | ICD-10-CM | POA: Insufficient documentation

## 2012-12-17 DIAGNOSIS — M278 Other specified diseases of jaws: Secondary | ICD-10-CM | POA: Insufficient documentation

## 2012-12-17 HISTORY — PX: MULTIPLE EXTRACTIONS WITH ALVEOLOPLASTY: SHX5342

## 2012-12-17 LAB — GLUCOSE, CAPILLARY: Glucose-Capillary: 94 mg/dL (ref 70–99)

## 2012-12-17 SURGERY — MULTIPLE EXTRACTION WITH ALVEOLOPLASTY
Anesthesia: General | Site: Mouth | Wound class: Clean Contaminated

## 2012-12-17 MED ORDER — FENTANYL CITRATE 0.05 MG/ML IJ SOLN
INTRAMUSCULAR | Status: DC | PRN
  Administered 2012-12-17: 50 ug via INTRAVENOUS
  Administered 2012-12-17: 100 ug via INTRAVENOUS

## 2012-12-17 MED ORDER — PROPOFOL 10 MG/ML IV BOLUS
INTRAVENOUS | Status: DC | PRN
  Administered 2012-12-17: 120 mg via INTRAVENOUS

## 2012-12-17 MED ORDER — ACETAMINOPHEN 650 MG RE SUPP: 650.0000 mg | Freq: Four times a day (QID) | RECTAL | Status: DC | PRN

## 2012-12-17 MED ORDER — ONDANSETRON HCL 4 MG/2ML IJ SOLN: 4.0000 mg | Freq: Four times a day (QID) | INTRAMUSCULAR | Status: DC | PRN

## 2012-12-17 MED ORDER — SUCCINYLCHOLINE CHLORIDE 20 MG/ML IJ SOLN
INTRAMUSCULAR | Status: DC | PRN
  Administered 2012-12-17: 140 mg via INTRAVENOUS

## 2012-12-17 MED ORDER — LACTATED RINGERS IV SOLN
INTRAVENOUS | Status: DC | PRN
  Administered 2012-12-17: 08:00:00 via INTRAVENOUS

## 2012-12-17 MED ORDER — DIPHENHYDRAMINE HCL 12.5 MG/5ML PO ELIX
12.5000 mg | ORAL_SOLUTION | Freq: Four times a day (QID) | ORAL | Status: DC | PRN
  Filled 2012-12-17: qty 10

## 2012-12-17 MED ORDER — HYDROMORPHONE HCL PF 1 MG/ML IJ SOLN: 1.0000 mg | INTRAMUSCULAR | Status: DC | PRN

## 2012-12-17 MED ORDER — SODIUM CHLORIDE 0.9 % IR SOLN
Status: DC | PRN
  Administered 2012-12-17: 1000 mL

## 2012-12-17 MED ORDER — OXYMETAZOLINE HCL 0.05 % NA SOLN
NASAL | Status: DC | PRN
  Administered 2012-12-17: 3 via NASAL

## 2012-12-17 MED ORDER — OXYCODONE HCL 5 MG PO TABS
5.0000 mg | ORAL_TABLET | ORAL | Status: DC | PRN
  Administered 2012-12-17 – 2012-12-18 (×3): 5 mg via ORAL
  Filled 2012-12-17 (×3): qty 1

## 2012-12-17 MED ORDER — OXYMETAZOLINE HCL 0.05 % NA SOLN
NASAL | Status: AC
  Filled 2012-12-17: qty 15

## 2012-12-17 MED ORDER — DEXTROSE-NACL 5-0.45 % IV SOLN
INTRAVENOUS | Status: DC
  Administered 2012-12-17: 13:00:00 via INTRAVENOUS

## 2012-12-17 MED ORDER — METOPROLOL TARTRATE 50 MG PO TABS
50.0000 mg | ORAL_TABLET | Freq: Once | ORAL | Status: AC
  Administered 2012-12-17: 50 mg via ORAL
  Filled 2012-12-17: qty 1

## 2012-12-17 MED ORDER — ARTIFICIAL TEARS OP OINT
TOPICAL_OINTMENT | OPHTHALMIC | Status: DC | PRN
  Administered 2012-12-17: 1 via OPHTHALMIC

## 2012-12-17 MED ORDER — LIDOCAINE-EPINEPHRINE 2 %-1:100000 IJ SOLN
INTRAMUSCULAR | Status: DC | PRN
  Administered 2012-12-17: 14 mL

## 2012-12-17 MED ORDER — LIDOCAINE-EPINEPHRINE 2 %-1:100000 IJ SOLN
INTRAMUSCULAR | Status: AC
  Filled 2012-12-17: qty 1

## 2012-12-17 MED ORDER — PHENYLEPHRINE HCL 10 MG/ML IJ SOLN
INTRAMUSCULAR | Status: DC | PRN
  Administered 2012-12-17 (×2): 80 ug via INTRAVENOUS

## 2012-12-17 MED ORDER — LIDOCAINE HCL (CARDIAC) 20 MG/ML IV SOLN
INTRAVENOUS | Status: DC | PRN
  Administered 2012-12-17: 80 mg via INTRAVENOUS

## 2012-12-17 MED ORDER — HYDROMORPHONE HCL PF 1 MG/ML IJ SOLN
INTRAMUSCULAR | Status: AC
  Filled 2012-12-17: qty 1

## 2012-12-17 MED ORDER — DIPHENHYDRAMINE HCL 50 MG/ML IJ SOLN
12.5000 mg | Freq: Four times a day (QID) | INTRAMUSCULAR | Status: DC | PRN
  Filled 2012-12-17: qty 1

## 2012-12-17 MED ORDER — HYDROMORPHONE HCL PF 1 MG/ML IJ SOLN
0.2500 mg | INTRAMUSCULAR | Status: DC | PRN
Start: 2012-12-17 — End: 2012-12-17
  Administered 2012-12-17: 0.25 mg via INTRAVENOUS
  Administered 2012-12-17: 0.5 mg via INTRAVENOUS

## 2012-12-17 MED ORDER — ACETAMINOPHEN 325 MG PO TABS
650.0000 mg | ORAL_TABLET | Freq: Four times a day (QID) | ORAL | Status: DC | PRN
  Administered 2012-12-17 – 2012-12-18 (×2): 650 mg via ORAL
  Filled 2012-12-17 (×2): qty 2

## 2012-12-17 MED ORDER — MEPERIDINE HCL 25 MG/ML IJ SOLN: 6.2500 mg | INTRAMUSCULAR | Status: DC | PRN

## 2012-12-17 MED ORDER — EPHEDRINE SULFATE 50 MG/ML IJ SOLN
INTRAMUSCULAR | Status: DC | PRN
  Administered 2012-12-17 (×2): 10 mg via INTRAVENOUS

## 2012-12-17 MED ORDER — OXYCODONE HCL 5 MG/5ML PO SOLN: 5.0000 mg | Freq: Once | ORAL | Status: DC | PRN

## 2012-12-17 MED ORDER — OXYCODONE HCL 5 MG PO TABS: 5.0000 mg | ORAL_TABLET | Freq: Once | ORAL | Status: DC | PRN

## 2012-12-17 MED ORDER — MIDAZOLAM HCL 5 MG/5ML IJ SOLN
INTRAMUSCULAR | Status: DC | PRN
  Administered 2012-12-17: 2 mg via INTRAVENOUS

## 2012-12-17 MED ORDER — ONDANSETRON HCL 4 MG/2ML IJ SOLN
4.0000 mg | Freq: Once | INTRAMUSCULAR | Status: AC | PRN
  Administered 2012-12-17: 4 mg via INTRAVENOUS

## 2012-12-17 MED ORDER — CLINDAMYCIN PHOSPHATE 600 MG/50ML IV SOLN
600.0000 mg | Freq: Four times a day (QID) | INTRAVENOUS | Status: DC
  Filled 2012-12-17 (×3): qty 50

## 2012-12-17 MED ORDER — ALBUTEROL SULFATE HFA 108 (90 BASE) MCG/ACT IN AERS
INHALATION_SPRAY | RESPIRATORY_TRACT | Status: DC | PRN
  Administered 2012-12-17: 2 via RESPIRATORY_TRACT

## 2012-12-17 MED ORDER — CLINDAMYCIN PHOSPHATE 600 MG/50ML IV SOLN
600.0000 mg | Freq: Once | INTRAVENOUS | Status: AC
  Administered 2012-12-17: 600 mg via INTRAVENOUS
  Filled 2012-12-17: qty 50

## 2012-12-17 SURGICAL SUPPLY — 27 items
BUR CROSS CUT FISSURE 1.6 (BURR) ×2 IMPLANT
BUR EGG ELITE 4.0 (BURR) ×2 IMPLANT
CANISTER SUCTION 2500CC (MISCELLANEOUS) ×2 IMPLANT
CLOTH BEACON ORANGE TIMEOUT ST (SAFETY) ×2 IMPLANT
COVER SURGICAL LIGHT HANDLE (MISCELLANEOUS) ×2 IMPLANT
CRADLE DONUT ADULT HEAD (MISCELLANEOUS) ×2 IMPLANT
DECANTER SPIKE VIAL GLASS SM (MISCELLANEOUS) ×2 IMPLANT
GAUZE PACKING FOLDED 2  STR (GAUZE/BANDAGES/DRESSINGS) ×1
GAUZE PACKING FOLDED 2 STR (GAUZE/BANDAGES/DRESSINGS) ×1 IMPLANT
GLOVE BIO SURGEON STRL SZ 6.5 (GLOVE) ×2 IMPLANT
GLOVE BIO SURGEON STRL SZ7.5 (GLOVE) ×2 IMPLANT
GLOVE BIOGEL PI IND STRL 7.0 (GLOVE) ×1 IMPLANT
GLOVE BIOGEL PI INDICATOR 7.0 (GLOVE) ×1
GOWN STRL NON-REIN LRG LVL3 (GOWN DISPOSABLE) ×2 IMPLANT
GOWN STRL REIN XL XLG (GOWN DISPOSABLE) ×2 IMPLANT
KIT BASIN OR (CUSTOM PROCEDURE TRAY) ×2 IMPLANT
KIT ROOM TURNOVER OR (KITS) ×2 IMPLANT
NEEDLE 22X1 1/2 (OR ONLY) (NEEDLE) ×2 IMPLANT
NS IRRIG 1000ML POUR BTL (IV SOLUTION) ×2 IMPLANT
PAD ARMBOARD 7.5X6 YLW CONV (MISCELLANEOUS) ×4 IMPLANT
SUT CHROMIC 3 0 PS 2 (SUTURE) ×4 IMPLANT
SYR CONTROL 10ML LL (SYRINGE) ×2 IMPLANT
TOWEL OR 17X26 10 PK STRL BLUE (TOWEL DISPOSABLE) ×2 IMPLANT
TRAY ENT MC OR (CUSTOM PROCEDURE TRAY) ×2 IMPLANT
TUBING IRRIGATION (MISCELLANEOUS) IMPLANT
WATER STERILE IRR 1000ML POUR (IV SOLUTION) ×2 IMPLANT
YANKAUER SUCT BULB TIP NO VENT (SUCTIONS) ×2 IMPLANT

## 2012-12-17 NOTE — Op Note (Signed)
12/17/2012  8:55 AM  PATIENT:  Charles Mathews  52 y.o. male  PRE-OPERATIVE DIAGNOSIS:  NON RESTORABLE TEETH #'s5, 6, 7, 8, 9, 10, 11, 12, 14, 15, 18, 27, 28, 29, 30, 31, 32, Exostosis left mandible  POST-OPERATIVE DIAGNOSIS:  SAME  PROCEDURE:  Procedure(s): MULTIPLE EXTRACTION TEETH #'s5, 6, 7, 8, 9, 10, 11, 12, 14, 15, 18, 27, 28, 29, 30, 31, 32, removal  Exostosis left mandible, ALVEOLOPLASTY  SURGEON:  Surgeon(s): Georgia Lopes, DDS  ANESTHESIA:   local and general  EBL:  minimal  DRAINS: none   SPECIMEN:  No Specimen  COUNTS:  YES  PLAN OF CARE: Discharge to home after PACU  PATIENT DISPOSITION:  PACU - hemodynamically stable.   PROCEDURE DETAILS: Dictation #[id number]  Georgia Lopes, DMD 12/17/2012 8:55 AM

## 2012-12-17 NOTE — Progress Notes (Signed)
Pt. Doesn't have any one to stay with him tonight. The person that was going to stay is sick.   Also ordered metoprolol, will send it to holding.

## 2012-12-17 NOTE — H&P (Signed)
H&P documentation  -History and Physical Reviewed  -Patient has been re-examined  -No change in the plan of care  [name]  

## 2012-12-17 NOTE — Anesthesia Preprocedure Evaluation (Addendum)
Anesthesia Evaluation  Patient identified by MRN, date of birth, ID band Patient awake    Reviewed: Allergy & Precautions, H&P , NPO status , Patient's Chart, lab work & pertinent test results, reviewed documented beta blocker date and time   Airway Mallampati: I      Dental  (+) Poor Dentition   Pulmonary          Cardiovascular hypertension, Pt. on home beta blockers + CAD     Neuro/Psych  Headaches, Seizures -, Poorly Controlled,     GI/Hepatic PUD, GERD-  Controlled,  Endo/Other  diabetes, Poorly Controlled  Renal/GU      Musculoskeletal   Abdominal   Peds  Hematology   Anesthesia Other Findings   Reproductive/Obstetrics                          Anesthesia Physical Anesthesia Plan  ASA: IV  Anesthesia Plan: General   Post-op Pain Management:    Induction: Intravenous  Airway Management Planned: Nasal ETT  Additional Equipment:   Intra-op Plan:   Post-operative Plan: Extubation in OR  Informed Consent: I have reviewed the patients History and Physical, chart, labs and discussed the procedure including the risks, benefits and alternatives for the proposed anesthesia with the patient or authorized representative who has indicated his/her understanding and acceptance.   Dental advisory given  Plan Discussed with: CRNA and Anesthesiologist  Anesthesia Plan Comments:         Anesthesia Quick Evaluation

## 2012-12-17 NOTE — Anesthesia Postprocedure Evaluation (Signed)
Anesthesia Post Note  Patient: Charles Mathews  Procedure(s) Performed: Procedure(s) (LRB): MULTIPLE EXTRACTION WITH ALVEOLOPLASTY (N/A)  Anesthesia type: general  Patient location: PACU  Post pain: Pain level controlled  Post assessment: Patient's Cardiovascular Status Stable  Last Vitals:  Filed Vitals:   12/17/12 0945  BP:   Pulse: 87  Temp:   Resp: 16    Post vital signs: Reviewed and stable  Level of consciousness: sedated  Complications: No apparent anesthesia complications

## 2012-12-17 NOTE — Op Note (Signed)
NAME:  Charles Mathews, Charles Mathews NO.:  [id number]  MEDICAL RECORD NO.:  [id number]  LOCATION:  6714                         FACILITY:  [facility]  PHYSICIAN:  Georgia Lopes, M.D.  DATE OF BIRTH:  [birth year]  DATE OF PROCEDURE:  12/17/2012 DATE OF DISCHARGE:                              OPERATIVE REPORT   PREOPERATIVE DIAGNOSIS:  Nonrestorable teeth #5, #6, 7, #8, #9, #10, #11, #12, #14, #15, #18, #27, #28, #29, #30, #31, #32, exostosis left mandible.  POSTOPERATIVE DIAGNOSIS:  Nonrestorable teeth #5, #6, 7, #8, #9, #10, #11, #12, #14, #15, #18, #27, #28, #29, #30, #31, #32, exostosis left mandible.  PROCEDURE:  Extraction of teeth #5, #6, 7, #8, #9, #10, #11, #12, #14, #15, #18, #27, #28, #29, #30, #31, #32, removal of exostosis left mandible alveoplasty.  SURGEON:  Georgia Lopes, M.D.  ANESTHESIA:  General, Dr. Michelle Piper attending.  INDICATIONS FOR PROCEDURE:  Charles Mathews is a 52 year old male who is referred to me by his general dentist for removal of all remaining teeth so that dentures can be fabricated.  He has a significant past medical history of COPD and morbid obesity.  Because of the extensiveness of the surgery and need for airway protection, it was recommended that the surgery be performed under general anesthesia with intubation.  PROCEDURE:  The patient was taken to the operating room, placed on the table in a supine position.  General anesthesia was administered intravenously and after a failed attempt at nasal intubation, an oral endotracheal tube was placed and marked.  The eyes were protected.  The patient was draped for the procedure.  Time-out was performed. Posterior pharynx was suctioned and a throat pack was placed.  Then, 2% lidocaine 1:100,000 epinephrine was infiltrated in an inferior alveolar block on the right and left side and buccal and palatal infiltration in the maxilla around the teeth to be removed, total of 14 mL of solution was utilized.   A bite-block was placed in the right side of the mouth and a sweetheart retractor was used to retract the tongue.  A 15 blade was used to make a full-thickness incision around tooth #18 extending anteriorly along the mandibular ridge up into the area of the canine region.  In the maxilla, a 15-blade was used to make a full-thickness incision starting at tooth #15 and carrying anteriorly buccally and palatally to tooth #7.  The periosteum was reflected in the maxilla and mandible with a periosteal elevator.  Then bone was removed around tooth #18.  The tooth was removed using the [facility] dental forceps.  The socket was curetted, and then attention was turned to the maxilla.  A 301 elevator and upper universal forceps were used to remove the upper teeth #7, #8, #9, #10, #11, #12, #13, #14, #15.  Sockets were then curetted. The periosteum was reflected to expose the alveolar crest in the maxilla and mandible.  The egg-shaped bur was used to perform alveoplasty in the maxilla and to remove the buccal exostosis in the mandible.  Then the areas were irrigated and closed with 3-0 chromic.  The bite-block and sweetheart retractor were repositioned to the other side of the mouth and  a 15-blade was used to make a full-thickness incision around teeth #5 and #6 in the maxilla and around teeth numbers #27, #28, #29, #30, #31, #32 in the mandible.  The periosteum was reflected.  Interproximal bone was removed around the posterior teeth using Stryker handpiece with irrigation.  Then the teeth were elevated with a 301 elevator and removed with forceps.  The periosteum was then further reflected to expose the alveolar bone and an alveoplasty was performed with the egg- shaped bur and the bone file.  The areas were then irrigated and closed with 3-0 chromic.  The oral cavity was then inspected and found to have good contour, hemostasis, and closure.  The oral cavity was irrigated, suctioned.  Throat pack  was removed.  The patient was awakened, taken to the recovery room, breathing spontaneously in good condition.  ESTIMATED BLOOD LOSS:  Minimum.  COMPLICATIONS:  None.  SPECIMENS:  None.     Georgia Lopes, M.D.     SMJ/MEDQ  D:  12/17/2012  T:  12/17/2012  Job:  [id number]

## 2012-12-17 NOTE — Transfer of Care (Signed)
Immediate Anesthesia Transfer of Care Note  Patient: Charles Mathews  Procedure(s) Performed: Procedure(s): MULTIPLE EXTRACTION WITH ALVEOLOPLASTY (N/A)  Patient Location: PACU  Anesthesia Type:General  Level of Consciousness: awake, alert  and oriented  Airway & Oxygen Therapy: Patient Spontanous Breathing and Patient connected to nasal cannula oxygen  Post-op Assessment: Report given to PACU RN, Post -op Vital signs reviewed and stable and Patient moving all extremities X 4  Post vital signs: Reviewed and stable  Complications: No apparent anesthesia complications

## 2012-12-17 NOTE — Preoperative (Signed)
Beta Blockers   Reason not to administer Beta Blockers:Hold  beta blocker due to Bradycardia (HR less than 50 bpm) 

## 2012-12-17 NOTE — Anesthesia Procedure Notes (Addendum)
Procedure Name: Intubation Date/Time: 12/17/2012 8:11 AM Performed by: Sharlene Dory E Pre-anesthesia Checklist: Patient identified, Emergency Drugs available, Suction available, Patient being monitored and Timeout performed Patient Re-evaluated:Patient Re-evaluated prior to inductionOxygen Delivery Method: Circle system utilized Preoxygenation: Pre-oxygenation with 100% oxygen Intubation Type: IV induction Ventilation: Mask ventilation with difficulty Laryngoscope Size: Mac and 3 Grade View: Grade II Tube type: Oral Number of attempts: 2 Placement Confirmation: ETT inserted through vocal cords under direct vision,  positive ETCO2,  CO2 detector and breath sounds checked- equal and bilateral Secured at: 23 cm Tube secured with: Tape Dental Injury: Teeth and Oropharynx as per pre-operative assessment  Comments: First attempt nasal intubation not passed through vocal cords. AOI on second attempt per MDA with +ETCO2 and EBBS

## 2012-12-18 ENCOUNTER — Encounter (HOSPITAL_COMMUNITY): Payer: Self-pay | Admitting: Oral Surgery

## 2012-12-18 MED ORDER — OXYCODONE-ACETAMINOPHEN 5-325 MG PO TABS: 1.0000 | ORAL_TABLET | ORAL | Status: DC | PRN

## 2012-12-18 NOTE — Discharge Summary (Signed)
Physician Discharge Summary  Patient ID: Charles Mathews MRN: [id number] DOB/AGE: [birth year] 52 y.o.  Admit date: 12/17/2012 Discharge date: 12/18/2012  Admission Diagnoses:Dental caries, mandibular exostosis  Discharge Diagnoses:  Active Problems:   * No active hospital problems. *   Discharged Condition: good  Hospital Course: Full mouth extractions performed under general anesthesia 12/17/2012.  Consults: None  Significant Diagnostic Studies: labs:  Treatments: IV hydration, antibiotics, analgesia: and surgery:   Discharge Exam: Blood pressure 150/72, pulse 72, temperature 97.8 F (36.6 C), temperature source Oral, resp. rate 18, weight 137.128 kg (302 lb 5 oz), SpO2 98.00%. General appearance: alert, cooperative and no distress Head: Normocephalic, without obvious abnormality, atraumatic Eyes: negative Ears: normal TM's and external ear canals both ears Nose: Nares normal. Septum midline. Mucosa normal. No drainage or sinus tenderness. Throat: Sutures intact. Hemostatic. Minimal edema. Pharynx clear. Neck: no adenopathy, supple, symmetrical, trachea midline and mild ecchymosis right mandible.  Disposition: 01-Home or Self Care  Discharge Orders   Future Orders Complete By Expires     Call MD for:  difficulty breathing, headache or visual disturbances  As directed     Call MD for:  persistant nausea and vomiting  As directed     Call MD for:  severe uncontrolled pain  As directed     Call MD for:  temperature >100.4  As directed     Diet - low sodium heart healthy  As directed     Comments:      Soft Diet. Advance as tolerated.    Discharge instructions  As directed     Comments:      Warm salt water mouth rinses 4-5 times per day starting the day after surgery. Ice to affected area for 2-3 days. Soft diet, advance as tolerated. No smoking for 2 weeks. Follow-up visit with Dr. Barbette Merino as scheduled. Call [phone] for problems.    Increase activity slowly  As  directed         Medication List    TAKE these medications       albuterol 108 (90 BASE) MCG/ACT inhaler  Commonly known as:  PROVENTIL HFA;VENTOLIN HFA  Inhale 2 puffs into the lungs every 6 (six) hours as needed for wheezing.     albuterol (2.5 MG/3ML) 0.083% nebulizer solution  Commonly known as:  PROVENTIL  Take 2.5 mg by nebulization every 6 (six) hours as needed for wheezing.     benzonatate 200 MG capsule  Commonly known as:  TESSALON  Take 1 capsule (200 mg total) by mouth 3 (three) times daily as needed for cough.     benztropine 1 MG tablet  Commonly known as:  COGENTIN  Take 1 mg by mouth at bedtime.     doxazosin 2 MG tablet  Commonly known as:  CARDURA  Take 2 mg by mouth every morning.     ezetimibe 10 MG tablet  Commonly known as:  ZETIA  Take 10 mg by mouth at bedtime.     Fluticasone-Salmeterol 100-50 MCG/DOSE Aepb  Commonly known as:  ADVAIR  Inhale 1 puff into the lungs every 12 (twelve) hours.     ibuprofen 200 MG tablet  Commonly known as:  ADVIL,MOTRIN  Take 200 mg by mouth every 6 (six) hours as needed for pain.     metoprolol 50 MG tablet  Commonly known as:  LOPRESSOR  Take 50 mg by mouth 2 (two) times daily.     omeprazole 20 MG capsule  Commonly known as:  PRILOSEC  Take 20 mg by mouth daily.     oxyCODONE-acetaminophen 5-325 MG per tablet  Commonly known as:  PERCOCET  Take 1-2 tablets by mouth every 4 (four) hours as needed for pain.     paliperidone 3 MG 24 hr tablet  Commonly known as:  INVEGA  Take 3 mg by mouth every morning.     ranitidine 150 MG tablet  Commonly known as:  ZANTAC  Take 150 mg by mouth 2 (two) times daily.     sertraline 100 MG tablet  Commonly known as:  ZOLOFT  Take 200 mg by mouth daily.     simvastatin 40 MG tablet  Commonly known as:  ZOCOR  Take 40 mg by mouth at bedtime.     topiramate 50 MG tablet  Commonly known as:  TOPAMAX  Take 50 mg by mouth 2 (two) times daily.     venlafaxine XR  150 MG 24 hr capsule  Commonly known as:  EFFEXOR-XR  Take 150 mg by mouth 2 (two) times daily. One each morning and one daily at noon         Signed: Adylin Hankey M 12/18/2012, 7:50 AM

## 2012-12-18 NOTE — Progress Notes (Signed)
Patient was discharged home with caretaker. Patient was discharged with paperwork and prescription. Patient was given discharge instructions about mouth care. Patient was told to call MD with questions and concerns. Patient was stable upon discharge.

## 2013-06-03 ENCOUNTER — Telehealth: Payer: Self-pay | Admitting: Pulmonary Disease

## 2013-06-03 NOTE — Telephone Encounter (Signed)
Called Patient to set up follow up apt, Left message x3. No return call back. Sent letter 06/03/13  ° °

## 2013-06-05 ENCOUNTER — Ambulatory Visit (INDEPENDENT_AMBULATORY_CARE_PROVIDER_SITE_OTHER)
Admission: RE | Admit: 2013-06-05 | Discharge: 2013-06-05 | Disposition: A | Discharge status: Home / Self Care | Payer: Medicare Other | Source: Ambulatory Visit | Attending: Pulmonary Disease | Admitting: Pulmonary Disease

## 2013-06-05 DIAGNOSIS — R0602 Shortness of breath: Secondary | ICD-10-CM

## 2013-06-06 ENCOUNTER — Telehealth: Payer: Self-pay

## 2013-06-06 ENCOUNTER — Encounter: Payer: Self-pay | Admitting: Pulmonary Disease

## 2013-06-06 NOTE — Telephone Encounter (Signed)
Message copied by Velvet Bathe on Thu Jun 06, 2013  9:40 AM ------      Message from: Max Fickle B      Created: Thu Jun 06, 2013  9:04 AM       A,            Please let him know that his CT scan is unchanged and looks OK.            Thanks,      B ------

## 2013-06-06 NOTE — Telephone Encounter (Signed)
Spoke to pt on cell phone, pt aware of CT results.

## 2013-06-06 NOTE — Telephone Encounter (Signed)
lmom for patient to cb at [facility] office

## 2013-06-18 ENCOUNTER — Ambulatory Visit (INDEPENDENT_AMBULATORY_CARE_PROVIDER_SITE_OTHER): Payer: PRIVATE HEALTH INSURANCE | Admitting: Pulmonary Disease

## 2013-06-18 ENCOUNTER — Encounter: Payer: Self-pay | Admitting: Pulmonary Disease

## 2013-06-18 VITALS — BP 112/78 | HR 78 | Ht 69.0 in | Wt 301.0 lb

## 2013-06-18 DIAGNOSIS — R222 Localized swelling, mass and lump, trunk: Secondary | ICD-10-CM

## 2013-06-18 DIAGNOSIS — J309 Allergic rhinitis, unspecified: Secondary | ICD-10-CM

## 2013-06-18 DIAGNOSIS — R918 Other nonspecific abnormal finding of lung field: Secondary | ICD-10-CM

## 2013-06-18 DIAGNOSIS — R0602 Shortness of breath: Secondary | ICD-10-CM

## 2013-06-18 NOTE — Assessment & Plan Note (Signed)
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  Based on that, there is no indication for further imaging.

## 2013-06-18 NOTE — Patient Instructions (Signed)
You do not need more imaging of your lungs regarding the lung mass (benign tumor) Use generic Zyrtec (cetirizine) and saline sprays as needed for nasal congestion Follow up with [place] as needed

## 2013-06-18 NOTE — Progress Notes (Signed)
Subjective:    Patient ID: Charles Mathews, male    DOB: 05/14/1961, 52 y.o.   MRN: [id number]  Synopsis: Mr. Percival first saw the [facility] in April 2014 for a RLL mass seen on multiple CXRs in the past.  He also carried a diagnosis of asthma but had no clear obstruction on PFT's performed in clinic that day.  He had a PET/CT scan ordered to follow up the mass did not show that the mass was FDG Avid  Shortness of Breath Pertinent negatives include no chest pain, fever, leg swelling or wheezing.    12/03/2012 ROV -- Mr. Anctil hasn't had his teeth pulled yet.  He states that two weeks ago he had to go to the hospital again two weeks ago for fever 101, bad cough, and some shortness of breath.  He continues to have cough productive of sputum, and says that things are a little better.  He is not taking any cough syrup or mucinex.  He lives by himself and has people check on him every so often.    06/18/2013 ROV >> He has been doing OK since the last visit and does not have an increase in dyspnea, cough, or wheezing.  He continues to get post nasal drip and sinus congestion when he is around smokers.  The cold weather makes his breathing worse.  He has been doing fairly well otherwise.     Past Medical History  Diagnosis Date  . Neuropathy   . High cholesterol     takes zocor daily  . Anxiety   . Lung mass   . Morbid obesity   . Chronic kidney disease     kidney stones  . Coronary artery disease     11/01/12 he denies known CAD/MI/CHF history  . Hypertension     takes metoprolol daily and cardura  . Asthma     Albuterol prn  . Shortness of breath     with exertion  . Migraine     last one couple nights ago and takes Topamax daily  . Seizures     last one a couple of months ago;takes cogentin daily  . Arthritis   . Joint pain   . Chronic back pain   . GERD (gastroesophageal reflux disease)     takes Prilosec daily  . Gastric ulcer   . Urinary frequency   . Urinary urgency    . Diabetes mellitus     borderline  . Major depression     takes Zoloft and effexor daily  . Insomnia       Review of Systems  Constitutional: Negative for fever, chills and fatigue.  HENT: Positive for postnasal drip and sinus pressure. Negative for sneezing.   Respiratory: Positive for shortness of breath. Negative for cough and wheezing.   Cardiovascular: Negative for chest pain, palpitations and leg swelling.       Objective:   Physical Exam   Filed Vitals:   06/18/13 1450 06/18/13 1451  BP:  112/78  Pulse:  78  Height: 5\' 9"  (1.753 m)   Weight: 301 lb (136.533 kg)   SpO2:  93%  112/78, HR 78, O2 saturation 93% RA  Gen: obese, disheveled, no acute distress HEENT: NCAT,  OP clear,  PULM: CTA B CV: RRR, no mgr, no JVD AB: BS+, soft, nontender, no hsm Ext: warm, no edema, no clubbing, no cyanosis  06/2013 CT chest images reviewed. 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  Based on that, there is no indication for further imaging.  Allergic rhinitis Recommended cetirizine and saline sprays  Shortness of breath Perhaps mild asthma at best.  This is due mostly to his obesity.  Plan: -could de-escalate Advair to QVar, but he is happy with his current regimen -continue prn albuterol -lose weight    Updated Medication List Outpatient Encounter Prescriptions as of 06/18/2013  Medication Sig  . albuterol (PROVENTIL HFA;VENTOLIN HFA) 108 (90 BASE) MCG/ACT inhaler Inhale 2 puffs into the lungs every 6 (six) hours as needed for wheezing.  Marland Kitchen albuterol (PROVENTIL) (2.5 MG/3ML) 0.083% nebulizer solution Take 2.5 mg by nebulization every 6 (six) hours as needed for wheezing.  . benzonatate (TESSALON) 200 MG capsule Take 1 capsule (200 mg total) by mouth 3 (three) times daily as needed for cough.  .  benztropine (COGENTIN) 1 MG tablet Take 1 mg by mouth at bedtime.    Marland Kitchen doxazosin (CARDURA) 2 MG tablet Take 2 mg by mouth every morning.   . ezetimibe (ZETIA) 10 MG tablet Take 10 mg by mouth at bedtime.    . Fluticasone-Salmeterol (ADVAIR) 100-50 MCG/DOSE AEPB Inhale 1 puff into the lungs every 12 (twelve) hours.  Marland Kitchen ibuprofen (ADVIL,MOTRIN) 200 MG tablet Take 200 mg by mouth every 6 (six) hours as needed for pain.  . metoprolol (LOPRESSOR) 50 MG tablet Take 50 mg by mouth 2 (two) times daily.    Marland Kitchen omeprazole (PRILOSEC) 20 MG capsule Take 20 mg by mouth daily.    Marland Kitchen oxyCODONE-acetaminophen (PERCOCET) 5-325 MG per tablet Take 1-2 tablets by mouth every 4 (four) hours as needed for pain.  . paliperidone (INVEGA) 3 MG 24 hr tablet Take 3 mg by mouth every morning.  . ranitidine (ZANTAC) 150 MG tablet Take 150 mg by mouth 2 (two) times daily.  . sertraline (ZOLOFT) 100 MG tablet Take 200 mg by mouth daily.  . simvastatin (ZOCOR) 40 MG tablet Take 40 mg by mouth at bedtime.    . topiramate (TOPAMAX) 50 MG tablet Take 50 mg by mouth 2 (two) times daily.  Marland Kitchen venlafaxine XR (EFFEXOR-XR) 150 MG 24 hr capsule Take 150 mg by mouth 2 (two) times daily. One each morning and one daily at noon

## 2013-06-18 NOTE — Assessment & Plan Note (Addendum)
Perhaps mild asthma at best.  This is due mostly to his obesity.  Plan: -could de-escalate Advair to QVar, but he is happy with his current regimen -continue prn albuterol -lose weight

## 2013-06-18 NOTE — Assessment & Plan Note (Signed)
Recommended cetirizine and saline sprays

## 2014-08-04 DIAGNOSIS — F411 Generalized anxiety disorder: Secondary | ICD-10-CM | POA: Diagnosis not present

## 2014-08-04 DIAGNOSIS — R0602 Shortness of breath: Secondary | ICD-10-CM | POA: Diagnosis not present

## 2014-08-04 DIAGNOSIS — J438 Other emphysema: Secondary | ICD-10-CM | POA: Diagnosis not present

## 2014-10-16 DIAGNOSIS — F319 Bipolar disorder, unspecified: Secondary | ICD-10-CM | POA: Diagnosis not present

## 2014-10-16 DIAGNOSIS — J449 Chronic obstructive pulmonary disease, unspecified: Secondary | ICD-10-CM | POA: Diagnosis not present

## 2014-10-16 DIAGNOSIS — E119 Type 2 diabetes mellitus without complications: Secondary | ICD-10-CM | POA: Diagnosis not present

## 2014-10-16 DIAGNOSIS — K635 Polyp of colon: Secondary | ICD-10-CM | POA: Diagnosis not present

## 2014-10-16 DIAGNOSIS — G629 Polyneuropathy, unspecified: Secondary | ICD-10-CM | POA: Diagnosis not present

## 2014-10-16 DIAGNOSIS — Z87442 Personal history of urinary calculi: Secondary | ICD-10-CM | POA: Diagnosis not present

## 2014-10-16 DIAGNOSIS — F419 Anxiety disorder, unspecified: Secondary | ICD-10-CM | POA: Diagnosis not present

## 2014-10-16 DIAGNOSIS — Z888 Allergy status to other drugs, medicaments and biological substances status: Secondary | ICD-10-CM | POA: Diagnosis not present

## 2014-10-16 DIAGNOSIS — D122 Benign neoplasm of ascending colon: Secondary | ICD-10-CM | POA: Diagnosis not present

## 2014-10-16 DIAGNOSIS — G4730 Sleep apnea, unspecified: Secondary | ICD-10-CM | POA: Diagnosis not present

## 2014-10-16 DIAGNOSIS — I10 Essential (primary) hypertension: Secondary | ICD-10-CM | POA: Diagnosis not present

## 2014-10-16 DIAGNOSIS — Z79899 Other long term (current) drug therapy: Secondary | ICD-10-CM | POA: Diagnosis not present

## 2014-10-16 DIAGNOSIS — Z1211 Encounter for screening for malignant neoplasm of colon: Secondary | ICD-10-CM | POA: Diagnosis not present

## 2014-10-16 DIAGNOSIS — E039 Hypothyroidism, unspecified: Secondary | ICD-10-CM | POA: Diagnosis not present

## 2014-10-17 DIAGNOSIS — K635 Polyp of colon: Secondary | ICD-10-CM | POA: Diagnosis not present

## 2014-10-18 DIAGNOSIS — R0789 Other chest pain: Secondary | ICD-10-CM | POA: Diagnosis not present

## 2014-10-18 DIAGNOSIS — K30 Functional dyspepsia: Secondary | ICD-10-CM | POA: Diagnosis not present

## 2014-11-03 DIAGNOSIS — M722 Plantar fascial fibromatosis: Secondary | ICD-10-CM | POA: Diagnosis not present

## 2014-12-03 DIAGNOSIS — M722 Plantar fascial fibromatosis: Secondary | ICD-10-CM | POA: Diagnosis not present

## 2014-12-03 DIAGNOSIS — M79671 Pain in right foot: Secondary | ICD-10-CM | POA: Diagnosis not present

## 2014-12-03 DIAGNOSIS — M79672 Pain in left foot: Secondary | ICD-10-CM | POA: Diagnosis not present

## 2014-12-25 DIAGNOSIS — M79671 Pain in right foot: Secondary | ICD-10-CM | POA: Diagnosis not present

## 2014-12-25 DIAGNOSIS — M79672 Pain in left foot: Secondary | ICD-10-CM | POA: Diagnosis not present

## 2014-12-25 DIAGNOSIS — M722 Plantar fascial fibromatosis: Secondary | ICD-10-CM | POA: Diagnosis not present

## 2015-01-19 DIAGNOSIS — H5211 Myopia, right eye: Secondary | ICD-10-CM | POA: Diagnosis not present

## 2015-01-19 DIAGNOSIS — H40013 Open angle with borderline findings, low risk, bilateral: Secondary | ICD-10-CM | POA: Diagnosis not present

## 2015-01-19 DIAGNOSIS — H04123 Dry eye syndrome of bilateral lacrimal glands: Secondary | ICD-10-CM | POA: Diagnosis not present

## 2015-01-19 DIAGNOSIS — E119 Type 2 diabetes mellitus without complications: Secondary | ICD-10-CM | POA: Diagnosis not present

## 2015-01-22 DIAGNOSIS — M722 Plantar fascial fibromatosis: Secondary | ICD-10-CM | POA: Diagnosis not present

## 2015-01-22 DIAGNOSIS — M79672 Pain in left foot: Secondary | ICD-10-CM | POA: Diagnosis not present

## 2015-01-22 DIAGNOSIS — M79671 Pain in right foot: Secondary | ICD-10-CM | POA: Diagnosis not present

## 2015-02-23 DIAGNOSIS — Z79899 Other long term (current) drug therapy: Secondary | ICD-10-CM | POA: Diagnosis not present

## 2015-02-23 DIAGNOSIS — Z7951 Long term (current) use of inhaled steroids: Secondary | ICD-10-CM | POA: Diagnosis not present

## 2015-02-23 DIAGNOSIS — I10 Essential (primary) hypertension: Secondary | ICD-10-CM | POA: Diagnosis not present

## 2015-02-23 DIAGNOSIS — J441 Chronic obstructive pulmonary disease with (acute) exacerbation: Secondary | ICD-10-CM | POA: Diagnosis not present

## 2015-02-23 DIAGNOSIS — F23 Brief psychotic disorder: Secondary | ICD-10-CM | POA: Diagnosis not present

## 2015-02-23 DIAGNOSIS — F419 Anxiety disorder, unspecified: Secondary | ICD-10-CM | POA: Diagnosis not present

## 2015-02-23 DIAGNOSIS — E119 Type 2 diabetes mellitus without complications: Secondary | ICD-10-CM | POA: Diagnosis not present

## 2015-02-23 DIAGNOSIS — R0602 Shortness of breath: Secondary | ICD-10-CM | POA: Diagnosis not present

## 2015-02-25 DIAGNOSIS — M722 Plantar fascial fibromatosis: Secondary | ICD-10-CM | POA: Diagnosis not present

## 2015-02-25 DIAGNOSIS — M79672 Pain in left foot: Secondary | ICD-10-CM | POA: Diagnosis not present

## 2015-02-25 DIAGNOSIS — M79671 Pain in right foot: Secondary | ICD-10-CM | POA: Diagnosis not present

## 2015-03-02 DIAGNOSIS — J438 Other emphysema: Secondary | ICD-10-CM | POA: Diagnosis not present

## 2015-03-02 DIAGNOSIS — K219 Gastro-esophageal reflux disease without esophagitis: Secondary | ICD-10-CM | POA: Diagnosis not present

## 2015-03-04 DIAGNOSIS — I10 Essential (primary) hypertension: Secondary | ICD-10-CM | POA: Diagnosis not present

## 2015-03-04 DIAGNOSIS — F411 Generalized anxiety disorder: Secondary | ICD-10-CM | POA: Diagnosis not present

## 2015-03-04 DIAGNOSIS — K219 Gastro-esophageal reflux disease without esophagitis: Secondary | ICD-10-CM | POA: Diagnosis not present

## 2015-03-04 DIAGNOSIS — Z1389 Encounter for screening for other disorder: Secondary | ICD-10-CM | POA: Diagnosis not present

## 2015-03-04 DIAGNOSIS — J438 Other emphysema: Secondary | ICD-10-CM | POA: Diagnosis not present

## 2015-03-18 DIAGNOSIS — M722 Plantar fascial fibromatosis: Secondary | ICD-10-CM | POA: Diagnosis not present

## 2015-03-18 DIAGNOSIS — M79671 Pain in right foot: Secondary | ICD-10-CM | POA: Diagnosis not present

## 2015-03-18 DIAGNOSIS — M79672 Pain in left foot: Secondary | ICD-10-CM | POA: Diagnosis not present

## 2015-04-05 DIAGNOSIS — I10 Essential (primary) hypertension: Secondary | ICD-10-CM | POA: Diagnosis not present

## 2015-04-05 DIAGNOSIS — F319 Bipolar disorder, unspecified: Secondary | ICD-10-CM | POA: Diagnosis not present

## 2015-04-05 DIAGNOSIS — J449 Chronic obstructive pulmonary disease, unspecified: Secondary | ICD-10-CM | POA: Diagnosis not present

## 2015-04-05 DIAGNOSIS — S39012A Strain of muscle, fascia and tendon of lower back, initial encounter: Secondary | ICD-10-CM | POA: Diagnosis not present

## 2015-04-05 DIAGNOSIS — E119 Type 2 diabetes mellitus without complications: Secondary | ICD-10-CM | POA: Diagnosis not present

## 2015-04-05 DIAGNOSIS — F419 Anxiety disorder, unspecified: Secondary | ICD-10-CM | POA: Diagnosis not present

## 2015-04-05 DIAGNOSIS — Z7951 Long term (current) use of inhaled steroids: Secondary | ICD-10-CM | POA: Diagnosis not present

## 2015-04-05 DIAGNOSIS — R52 Pain, unspecified: Secondary | ICD-10-CM | POA: Diagnosis not present

## 2015-04-05 DIAGNOSIS — R1031 Right lower quadrant pain: Secondary | ICD-10-CM | POA: Diagnosis not present

## 2015-04-05 DIAGNOSIS — S39011A Strain of muscle, fascia and tendon of abdomen, initial encounter: Secondary | ICD-10-CM | POA: Diagnosis not present

## 2015-04-05 DIAGNOSIS — Z79899 Other long term (current) drug therapy: Secondary | ICD-10-CM | POA: Diagnosis not present

## 2015-04-05 DIAGNOSIS — R109 Unspecified abdominal pain: Secondary | ICD-10-CM | POA: Diagnosis not present

## 2015-04-05 DIAGNOSIS — R569 Unspecified convulsions: Secondary | ICD-10-CM | POA: Diagnosis not present

## 2015-04-16 DIAGNOSIS — M79672 Pain in left foot: Secondary | ICD-10-CM | POA: Diagnosis not present

## 2015-04-16 DIAGNOSIS — M722 Plantar fascial fibromatosis: Secondary | ICD-10-CM | POA: Diagnosis not present

## 2015-07-01 DIAGNOSIS — E1140 Type 2 diabetes mellitus with diabetic neuropathy, unspecified: Secondary | ICD-10-CM | POA: Diagnosis not present

## 2015-07-01 DIAGNOSIS — L110 Acquired keratosis follicularis: Secondary | ICD-10-CM | POA: Diagnosis not present

## 2015-07-01 DIAGNOSIS — L609 Nail disorder, unspecified: Secondary | ICD-10-CM | POA: Diagnosis not present

## 2015-07-02 DIAGNOSIS — M722 Plantar fascial fibromatosis: Secondary | ICD-10-CM | POA: Diagnosis not present

## 2015-07-02 DIAGNOSIS — M79671 Pain in right foot: Secondary | ICD-10-CM | POA: Diagnosis not present

## 2015-07-05 DIAGNOSIS — Z7951 Long term (current) use of inhaled steroids: Secondary | ICD-10-CM | POA: Diagnosis not present

## 2015-07-05 DIAGNOSIS — Z79899 Other long term (current) drug therapy: Secondary | ICD-10-CM | POA: Diagnosis not present

## 2015-07-05 DIAGNOSIS — W19XXXA Unspecified fall, initial encounter: Secondary | ICD-10-CM | POA: Diagnosis not present

## 2015-07-05 DIAGNOSIS — S90852A Superficial foreign body, left foot, initial encounter: Secondary | ICD-10-CM | POA: Diagnosis not present

## 2015-07-05 DIAGNOSIS — Z87891 Personal history of nicotine dependence: Secondary | ICD-10-CM | POA: Diagnosis not present

## 2015-07-05 DIAGNOSIS — I10 Essential (primary) hypertension: Secondary | ICD-10-CM | POA: Diagnosis not present

## 2015-07-05 DIAGNOSIS — J449 Chronic obstructive pulmonary disease, unspecified: Secondary | ICD-10-CM | POA: Diagnosis not present

## 2015-07-05 DIAGNOSIS — S90851A Superficial foreign body, right foot, initial encounter: Secondary | ICD-10-CM | POA: Diagnosis not present

## 2015-07-05 DIAGNOSIS — E119 Type 2 diabetes mellitus without complications: Secondary | ICD-10-CM | POA: Diagnosis not present

## 2015-07-10 DIAGNOSIS — Z23 Encounter for immunization: Secondary | ICD-10-CM | POA: Diagnosis not present

## 2015-07-10 DIAGNOSIS — I10 Essential (primary) hypertension: Secondary | ICD-10-CM | POA: Diagnosis not present

## 2015-07-10 DIAGNOSIS — J438 Other emphysema: Secondary | ICD-10-CM | POA: Diagnosis not present

## 2015-07-10 DIAGNOSIS — F411 Generalized anxiety disorder: Secondary | ICD-10-CM | POA: Diagnosis not present

## 2015-07-10 DIAGNOSIS — K219 Gastro-esophageal reflux disease without esophagitis: Secondary | ICD-10-CM | POA: Diagnosis not present

## 2015-08-17 DIAGNOSIS — R197 Diarrhea, unspecified: Secondary | ICD-10-CM | POA: Diagnosis not present

## 2015-08-26 DIAGNOSIS — Z87891 Personal history of nicotine dependence: Secondary | ICD-10-CM | POA: Diagnosis not present

## 2015-08-26 DIAGNOSIS — Z87440 Personal history of urinary (tract) infections: Secondary | ICD-10-CM | POA: Diagnosis not present

## 2015-08-26 DIAGNOSIS — R1013 Epigastric pain: Secondary | ICD-10-CM | POA: Diagnosis not present

## 2015-08-26 DIAGNOSIS — R079 Chest pain, unspecified: Secondary | ICD-10-CM | POA: Diagnosis not present

## 2015-08-26 DIAGNOSIS — Z91040 Latex allergy status: Secondary | ICD-10-CM | POA: Diagnosis not present

## 2015-08-26 DIAGNOSIS — Z880 Allergy status to penicillin: Secondary | ICD-10-CM | POA: Diagnosis not present

## 2015-08-26 DIAGNOSIS — E1140 Type 2 diabetes mellitus with diabetic neuropathy, unspecified: Secondary | ICD-10-CM | POA: Diagnosis not present

## 2015-08-26 DIAGNOSIS — Z79899 Other long term (current) drug therapy: Secondary | ICD-10-CM | POA: Diagnosis not present

## 2015-08-26 DIAGNOSIS — Z7951 Long term (current) use of inhaled steroids: Secondary | ICD-10-CM | POA: Diagnosis not present

## 2015-08-26 DIAGNOSIS — G4733 Obstructive sleep apnea (adult) (pediatric): Secondary | ICD-10-CM | POA: Diagnosis not present

## 2015-08-26 DIAGNOSIS — Z87442 Personal history of urinary calculi: Secondary | ICD-10-CM | POA: Diagnosis not present

## 2015-08-26 DIAGNOSIS — E785 Hyperlipidemia, unspecified: Secondary | ICD-10-CM | POA: Diagnosis not present

## 2015-08-26 DIAGNOSIS — Z9119 Patient's noncompliance with other medical treatment and regimen: Secondary | ICD-10-CM | POA: Diagnosis not present

## 2015-08-26 DIAGNOSIS — Z888 Allergy status to other drugs, medicaments and biological substances status: Secondary | ICD-10-CM | POA: Diagnosis not present

## 2015-08-26 DIAGNOSIS — K449 Diaphragmatic hernia without obstruction or gangrene: Secondary | ICD-10-CM | POA: Diagnosis not present

## 2015-08-26 DIAGNOSIS — J449 Chronic obstructive pulmonary disease, unspecified: Secondary | ICD-10-CM | POA: Diagnosis not present

## 2015-08-26 DIAGNOSIS — I10 Essential (primary) hypertension: Secondary | ICD-10-CM | POA: Diagnosis not present

## 2015-08-26 DIAGNOSIS — Z833 Family history of diabetes mellitus: Secondary | ICD-10-CM | POA: Diagnosis not present

## 2015-08-26 DIAGNOSIS — G8929 Other chronic pain: Secondary | ICD-10-CM | POA: Diagnosis not present

## 2015-08-26 DIAGNOSIS — M79602 Pain in left arm: Secondary | ICD-10-CM | POA: Diagnosis not present

## 2015-09-26 DIAGNOSIS — R0789 Other chest pain: Secondary | ICD-10-CM | POA: Diagnosis not present

## 2015-09-26 DIAGNOSIS — F411 Generalized anxiety disorder: Secondary | ICD-10-CM | POA: Diagnosis not present

## 2015-09-26 DIAGNOSIS — I10 Essential (primary) hypertension: Secondary | ICD-10-CM | POA: Diagnosis not present

## 2015-10-15 DIAGNOSIS — G4733 Obstructive sleep apnea (adult) (pediatric): Secondary | ICD-10-CM | POA: Diagnosis not present

## 2015-10-15 DIAGNOSIS — F411 Generalized anxiety disorder: Secondary | ICD-10-CM | POA: Diagnosis not present

## 2015-11-14 DIAGNOSIS — R0902 Hypoxemia: Secondary | ICD-10-CM | POA: Diagnosis not present

## 2015-11-14 DIAGNOSIS — J449 Chronic obstructive pulmonary disease, unspecified: Secondary | ICD-10-CM | POA: Diagnosis not present

## 2015-11-14 DIAGNOSIS — I10 Essential (primary) hypertension: Secondary | ICD-10-CM | POA: Diagnosis not present

## 2015-11-14 DIAGNOSIS — G4733 Obstructive sleep apnea (adult) (pediatric): Secondary | ICD-10-CM | POA: Diagnosis not present

## 2015-11-14 DIAGNOSIS — J441 Chronic obstructive pulmonary disease with (acute) exacerbation: Secondary | ICD-10-CM | POA: Diagnosis not present

## 2015-11-14 DIAGNOSIS — R509 Fever, unspecified: Secondary | ICD-10-CM | POA: Diagnosis not present

## 2015-11-14 DIAGNOSIS — R05 Cough: Secondary | ICD-10-CM | POA: Diagnosis not present

## 2015-11-14 DIAGNOSIS — E118 Type 2 diabetes mellitus with unspecified complications: Secondary | ICD-10-CM | POA: Diagnosis not present

## 2015-12-03 DIAGNOSIS — F411 Generalized anxiety disorder: Secondary | ICD-10-CM | POA: Diagnosis not present

## 2015-12-03 DIAGNOSIS — K219 Gastro-esophageal reflux disease without esophagitis: Secondary | ICD-10-CM | POA: Diagnosis not present

## 2015-12-03 DIAGNOSIS — I10 Essential (primary) hypertension: Secondary | ICD-10-CM | POA: Diagnosis not present

## 2015-12-03 DIAGNOSIS — G4733 Obstructive sleep apnea (adult) (pediatric): Secondary | ICD-10-CM | POA: Diagnosis not present

## 2015-12-17 DIAGNOSIS — R05 Cough: Secondary | ICD-10-CM | POA: Diagnosis not present

## 2015-12-17 DIAGNOSIS — R197 Diarrhea, unspecified: Secondary | ICD-10-CM | POA: Diagnosis not present

## 2015-12-17 DIAGNOSIS — M79672 Pain in left foot: Secondary | ICD-10-CM | POA: Diagnosis not present

## 2015-12-18 DIAGNOSIS — M79672 Pain in left foot: Secondary | ICD-10-CM | POA: Diagnosis not present

## 2016-01-19 DIAGNOSIS — J4542 Moderate persistent asthma with status asthmaticus: Secondary | ICD-10-CM | POA: Diagnosis not present

## 2016-01-19 DIAGNOSIS — G4733 Obstructive sleep apnea (adult) (pediatric): Secondary | ICD-10-CM | POA: Diagnosis not present

## 2016-01-19 DIAGNOSIS — F411 Generalized anxiety disorder: Secondary | ICD-10-CM | POA: Diagnosis not present

## 2016-01-19 DIAGNOSIS — I10 Essential (primary) hypertension: Secondary | ICD-10-CM | POA: Diagnosis not present

## 2016-05-08 DIAGNOSIS — R0789 Other chest pain: Secondary | ICD-10-CM | POA: Diagnosis not present

## 2016-05-09 DIAGNOSIS — Z23 Encounter for immunization: Secondary | ICD-10-CM | POA: Diagnosis not present

## 2016-05-09 DIAGNOSIS — Z0001 Encounter for general adult medical examination with abnormal findings: Secondary | ICD-10-CM | POA: Diagnosis not present

## 2016-05-09 DIAGNOSIS — J4542 Moderate persistent asthma with status asthmaticus: Secondary | ICD-10-CM | POA: Diagnosis not present

## 2016-08-12 DIAGNOSIS — S42002A Fracture of unspecified part of left clavicle, initial encounter for closed fracture: Secondary | ICD-10-CM | POA: Diagnosis not present

## 2016-08-12 DIAGNOSIS — E119 Type 2 diabetes mellitus without complications: Secondary | ICD-10-CM | POA: Diagnosis not present

## 2016-08-12 DIAGNOSIS — J449 Chronic obstructive pulmonary disease, unspecified: Secondary | ICD-10-CM | POA: Diagnosis not present

## 2016-08-12 DIAGNOSIS — S098XXA Other specified injuries of head, initial encounter: Secondary | ICD-10-CM | POA: Diagnosis not present

## 2016-08-12 DIAGNOSIS — S0091XA Abrasion of unspecified part of head, initial encounter: Secondary | ICD-10-CM | POA: Diagnosis not present

## 2016-08-12 DIAGNOSIS — W108XXA Fall (on) (from) other stairs and steps, initial encounter: Secondary | ICD-10-CM | POA: Diagnosis not present

## 2016-08-12 DIAGNOSIS — I10 Essential (primary) hypertension: Secondary | ICD-10-CM | POA: Diagnosis not present

## 2016-08-12 DIAGNOSIS — S0990XA Unspecified injury of head, initial encounter: Secondary | ICD-10-CM | POA: Diagnosis not present

## 2016-08-12 DIAGNOSIS — R52 Pain, unspecified: Secondary | ICD-10-CM | POA: Diagnosis not present

## 2016-08-12 DIAGNOSIS — S42022A Displaced fracture of shaft of left clavicle, initial encounter for closed fracture: Secondary | ICD-10-CM | POA: Diagnosis not present

## 2016-08-12 DIAGNOSIS — Z79899 Other long term (current) drug therapy: Secondary | ICD-10-CM | POA: Diagnosis not present

## 2016-08-12 DIAGNOSIS — R079 Chest pain, unspecified: Secondary | ICD-10-CM | POA: Diagnosis not present

## 2016-08-12 DIAGNOSIS — Z7951 Long term (current) use of inhaled steroids: Secondary | ICD-10-CM | POA: Diagnosis not present

## 2016-08-12 DIAGNOSIS — S199XXA Unspecified injury of neck, initial encounter: Secondary | ICD-10-CM | POA: Diagnosis not present

## 2016-08-17 ENCOUNTER — Ambulatory Visit (INDEPENDENT_AMBULATORY_CARE_PROVIDER_SITE_OTHER): Payer: Self-pay | Admitting: Orthopaedic Surgery

## 2016-08-19 DIAGNOSIS — S42002D Fracture of unspecified part of left clavicle, subsequent encounter for fracture with routine healing: Secondary | ICD-10-CM | POA: Diagnosis not present

## 2016-08-23 DIAGNOSIS — Z7984 Long term (current) use of oral hypoglycemic drugs: Secondary | ICD-10-CM | POA: Diagnosis not present

## 2016-08-23 DIAGNOSIS — E119 Type 2 diabetes mellitus without complications: Secondary | ICD-10-CM | POA: Diagnosis not present

## 2016-08-23 DIAGNOSIS — E1165 Type 2 diabetes mellitus with hyperglycemia: Secondary | ICD-10-CM | POA: Diagnosis not present

## 2016-08-23 DIAGNOSIS — H40013 Open angle with borderline findings, low risk, bilateral: Secondary | ICD-10-CM | POA: Diagnosis not present

## 2016-09-01 DIAGNOSIS — E1140 Type 2 diabetes mellitus with diabetic neuropathy, unspecified: Secondary | ICD-10-CM | POA: Diagnosis not present

## 2016-09-01 DIAGNOSIS — R030 Elevated blood-pressure reading, without diagnosis of hypertension: Secondary | ICD-10-CM | POA: Diagnosis not present

## 2016-09-01 DIAGNOSIS — R112 Nausea with vomiting, unspecified: Secondary | ICD-10-CM | POA: Diagnosis not present

## 2016-09-01 DIAGNOSIS — Z79899 Other long term (current) drug therapy: Secondary | ICD-10-CM | POA: Diagnosis not present

## 2016-09-01 DIAGNOSIS — R569 Unspecified convulsions: Secondary | ICD-10-CM | POA: Diagnosis not present

## 2016-09-01 DIAGNOSIS — J449 Chronic obstructive pulmonary disease, unspecified: Secondary | ICD-10-CM | POA: Diagnosis not present

## 2016-09-01 DIAGNOSIS — I10 Essential (primary) hypertension: Secondary | ICD-10-CM | POA: Diagnosis not present

## 2016-09-01 DIAGNOSIS — R110 Nausea: Secondary | ICD-10-CM | POA: Diagnosis not present

## 2016-09-01 DIAGNOSIS — R6883 Chills (without fever): Secondary | ICD-10-CM | POA: Diagnosis not present

## 2016-09-05 DIAGNOSIS — S42009A Fracture of unspecified part of unspecified clavicle, initial encounter for closed fracture: Secondary | ICD-10-CM | POA: Diagnosis not present

## 2016-09-09 DIAGNOSIS — S42002D Fracture of unspecified part of left clavicle, subsequent encounter for fracture with routine healing: Secondary | ICD-10-CM | POA: Diagnosis not present

## 2016-10-07 DIAGNOSIS — S42002D Fracture of unspecified part of left clavicle, subsequent encounter for fracture with routine healing: Secondary | ICD-10-CM | POA: Diagnosis not present

## 2016-12-29 DIAGNOSIS — J449 Chronic obstructive pulmonary disease, unspecified: Secondary | ICD-10-CM | POA: Diagnosis not present

## 2016-12-29 DIAGNOSIS — R05 Cough: Secondary | ICD-10-CM | POA: Diagnosis not present

## 2016-12-29 DIAGNOSIS — R0602 Shortness of breath: Secondary | ICD-10-CM | POA: Diagnosis not present

## 2016-12-29 DIAGNOSIS — Z79899 Other long term (current) drug therapy: Secondary | ICD-10-CM | POA: Diagnosis not present

## 2016-12-29 DIAGNOSIS — J029 Acute pharyngitis, unspecified: Secondary | ICD-10-CM | POA: Diagnosis not present

## 2016-12-29 DIAGNOSIS — E119 Type 2 diabetes mellitus without complications: Secondary | ICD-10-CM | POA: Diagnosis not present

## 2016-12-29 DIAGNOSIS — I10 Essential (primary) hypertension: Secondary | ICD-10-CM | POA: Diagnosis not present

## 2016-12-29 DIAGNOSIS — R070 Pain in throat: Secondary | ICD-10-CM | POA: Diagnosis not present

## 2016-12-29 DIAGNOSIS — Z7951 Long term (current) use of inhaled steroids: Secondary | ICD-10-CM | POA: Diagnosis not present

## 2016-12-29 DIAGNOSIS — R569 Unspecified convulsions: Secondary | ICD-10-CM | POA: Diagnosis not present

## 2017-01-05 DIAGNOSIS — Q858 Other phakomatoses, not elsewhere classified: Secondary | ICD-10-CM | POA: Diagnosis not present

## 2017-01-05 DIAGNOSIS — R05 Cough: Secondary | ICD-10-CM | POA: Diagnosis not present

## 2017-01-24 DIAGNOSIS — R197 Diarrhea, unspecified: Secondary | ICD-10-CM | POA: Diagnosis not present

## 2017-01-24 DIAGNOSIS — R1084 Generalized abdominal pain: Secondary | ICD-10-CM | POA: Diagnosis not present

## 2017-01-24 DIAGNOSIS — J449 Chronic obstructive pulmonary disease, unspecified: Secondary | ICD-10-CM | POA: Diagnosis not present

## 2017-01-24 DIAGNOSIS — Z7951 Long term (current) use of inhaled steroids: Secondary | ICD-10-CM | POA: Diagnosis not present

## 2017-01-24 DIAGNOSIS — K2970 Gastritis, unspecified, without bleeding: Secondary | ICD-10-CM | POA: Diagnosis not present

## 2017-01-24 DIAGNOSIS — Z79899 Other long term (current) drug therapy: Secondary | ICD-10-CM | POA: Diagnosis not present

## 2017-01-24 DIAGNOSIS — R110 Nausea: Secondary | ICD-10-CM | POA: Diagnosis not present

## 2017-01-24 DIAGNOSIS — E119 Type 2 diabetes mellitus without complications: Secondary | ICD-10-CM | POA: Diagnosis not present

## 2017-01-24 DIAGNOSIS — R112 Nausea with vomiting, unspecified: Secondary | ICD-10-CM | POA: Diagnosis not present

## 2017-01-24 DIAGNOSIS — R109 Unspecified abdominal pain: Secondary | ICD-10-CM | POA: Diagnosis not present

## 2017-01-24 DIAGNOSIS — I10 Essential (primary) hypertension: Secondary | ICD-10-CM | POA: Diagnosis not present

## 2017-01-31 DIAGNOSIS — K219 Gastro-esophageal reflux disease without esophagitis: Secondary | ICD-10-CM | POA: Diagnosis not present

## 2017-01-31 DIAGNOSIS — R0789 Other chest pain: Secondary | ICD-10-CM | POA: Diagnosis not present

## 2017-02-27 ENCOUNTER — Institutional Professional Consult (permissible substitution): Payer: Medicaid Other | Admitting: Pulmonary Disease

## 2017-03-11 DIAGNOSIS — T675XXA Heat exhaustion, unspecified, initial encounter: Secondary | ICD-10-CM | POA: Diagnosis not present

## 2017-03-11 DIAGNOSIS — J449 Chronic obstructive pulmonary disease, unspecified: Secondary | ICD-10-CM | POA: Diagnosis not present

## 2017-03-11 DIAGNOSIS — Z79899 Other long term (current) drug therapy: Secondary | ICD-10-CM | POA: Diagnosis not present

## 2017-03-11 DIAGNOSIS — R197 Diarrhea, unspecified: Secondary | ICD-10-CM | POA: Diagnosis not present

## 2017-03-11 DIAGNOSIS — Z7951 Long term (current) use of inhaled steroids: Secondary | ICD-10-CM | POA: Diagnosis not present

## 2017-03-11 DIAGNOSIS — I10 Essential (primary) hypertension: Secondary | ICD-10-CM | POA: Diagnosis not present

## 2017-03-11 DIAGNOSIS — R55 Syncope and collapse: Secondary | ICD-10-CM | POA: Diagnosis not present

## 2017-03-11 DIAGNOSIS — R569 Unspecified convulsions: Secondary | ICD-10-CM | POA: Diagnosis not present

## 2017-03-11 DIAGNOSIS — E119 Type 2 diabetes mellitus without complications: Secondary | ICD-10-CM | POA: Diagnosis not present

## 2017-03-11 DIAGNOSIS — E860 Dehydration: Secondary | ICD-10-CM | POA: Diagnosis not present

## 2017-05-04 DIAGNOSIS — Z87891 Personal history of nicotine dependence: Secondary | ICD-10-CM | POA: Diagnosis not present

## 2017-05-04 DIAGNOSIS — R0602 Shortness of breath: Secondary | ICD-10-CM | POA: Diagnosis not present

## 2017-05-04 DIAGNOSIS — G4733 Obstructive sleep apnea (adult) (pediatric): Secondary | ICD-10-CM | POA: Diagnosis not present

## 2017-05-04 DIAGNOSIS — Z888 Allergy status to other drugs, medicaments and biological substances status: Secondary | ICD-10-CM | POA: Diagnosis not present

## 2017-05-04 DIAGNOSIS — R0902 Hypoxemia: Secondary | ICD-10-CM | POA: Diagnosis not present

## 2017-05-04 DIAGNOSIS — Z79899 Other long term (current) drug therapy: Secondary | ICD-10-CM | POA: Diagnosis not present

## 2017-05-04 DIAGNOSIS — Z880 Allergy status to penicillin: Secondary | ICD-10-CM | POA: Diagnosis not present

## 2017-05-04 DIAGNOSIS — E119 Type 2 diabetes mellitus without complications: Secondary | ICD-10-CM | POA: Diagnosis not present

## 2017-05-04 DIAGNOSIS — I10 Essential (primary) hypertension: Secondary | ICD-10-CM | POA: Diagnosis not present

## 2017-05-04 DIAGNOSIS — J9691 Respiratory failure, unspecified with hypoxia: Secondary | ICD-10-CM | POA: Diagnosis not present

## 2017-05-04 DIAGNOSIS — J441 Chronic obstructive pulmonary disease with (acute) exacerbation: Secondary | ICD-10-CM | POA: Diagnosis not present

## 2017-05-05 DIAGNOSIS — J441 Chronic obstructive pulmonary disease with (acute) exacerbation: Secondary | ICD-10-CM | POA: Diagnosis not present

## 2017-05-05 DIAGNOSIS — R0902 Hypoxemia: Secondary | ICD-10-CM | POA: Diagnosis not present

## 2017-05-06 DIAGNOSIS — J441 Chronic obstructive pulmonary disease with (acute) exacerbation: Secondary | ICD-10-CM | POA: Diagnosis not present

## 2017-05-06 DIAGNOSIS — E119 Type 2 diabetes mellitus without complications: Secondary | ICD-10-CM | POA: Diagnosis not present

## 2017-05-06 DIAGNOSIS — J9691 Respiratory failure, unspecified with hypoxia: Secondary | ICD-10-CM | POA: Diagnosis not present

## 2017-05-08 DIAGNOSIS — J441 Chronic obstructive pulmonary disease with (acute) exacerbation: Secondary | ICD-10-CM | POA: Diagnosis not present

## 2017-05-08 DIAGNOSIS — G4733 Obstructive sleep apnea (adult) (pediatric): Secondary | ICD-10-CM | POA: Diagnosis not present

## 2017-05-08 DIAGNOSIS — Z9981 Dependence on supplemental oxygen: Secondary | ICD-10-CM | POA: Diagnosis not present

## 2017-05-08 DIAGNOSIS — Z7951 Long term (current) use of inhaled steroids: Secondary | ICD-10-CM | POA: Diagnosis not present

## 2017-05-08 DIAGNOSIS — E119 Type 2 diabetes mellitus without complications: Secondary | ICD-10-CM | POA: Diagnosis not present

## 2017-05-08 DIAGNOSIS — I10 Essential (primary) hypertension: Secondary | ICD-10-CM | POA: Diagnosis not present

## 2017-05-12 DIAGNOSIS — Z7951 Long term (current) use of inhaled steroids: Secondary | ICD-10-CM | POA: Diagnosis not present

## 2017-05-12 DIAGNOSIS — E119 Type 2 diabetes mellitus without complications: Secondary | ICD-10-CM | POA: Diagnosis not present

## 2017-05-12 DIAGNOSIS — Z9981 Dependence on supplemental oxygen: Secondary | ICD-10-CM | POA: Diagnosis not present

## 2017-05-12 DIAGNOSIS — I10 Essential (primary) hypertension: Secondary | ICD-10-CM | POA: Diagnosis not present

## 2017-05-12 DIAGNOSIS — J441 Chronic obstructive pulmonary disease with (acute) exacerbation: Secondary | ICD-10-CM | POA: Diagnosis not present

## 2017-05-12 DIAGNOSIS — G4733 Obstructive sleep apnea (adult) (pediatric): Secondary | ICD-10-CM | POA: Diagnosis not present

## 2017-05-17 DIAGNOSIS — G4733 Obstructive sleep apnea (adult) (pediatric): Secondary | ICD-10-CM | POA: Diagnosis not present

## 2017-05-17 DIAGNOSIS — I10 Essential (primary) hypertension: Secondary | ICD-10-CM | POA: Diagnosis not present

## 2017-05-17 DIAGNOSIS — Z7951 Long term (current) use of inhaled steroids: Secondary | ICD-10-CM | POA: Diagnosis not present

## 2017-05-17 DIAGNOSIS — J441 Chronic obstructive pulmonary disease with (acute) exacerbation: Secondary | ICD-10-CM | POA: Diagnosis not present

## 2017-05-17 DIAGNOSIS — E119 Type 2 diabetes mellitus without complications: Secondary | ICD-10-CM | POA: Diagnosis not present

## 2017-05-17 DIAGNOSIS — Z9981 Dependence on supplemental oxygen: Secondary | ICD-10-CM | POA: Diagnosis not present

## 2017-05-19 DIAGNOSIS — E119 Type 2 diabetes mellitus without complications: Secondary | ICD-10-CM | POA: Diagnosis not present

## 2017-05-19 DIAGNOSIS — Z7951 Long term (current) use of inhaled steroids: Secondary | ICD-10-CM | POA: Diagnosis not present

## 2017-05-19 DIAGNOSIS — I10 Essential (primary) hypertension: Secondary | ICD-10-CM | POA: Diagnosis not present

## 2017-05-19 DIAGNOSIS — G4733 Obstructive sleep apnea (adult) (pediatric): Secondary | ICD-10-CM | POA: Diagnosis not present

## 2017-05-19 DIAGNOSIS — Z9981 Dependence on supplemental oxygen: Secondary | ICD-10-CM | POA: Diagnosis not present

## 2017-05-19 DIAGNOSIS — J441 Chronic obstructive pulmonary disease with (acute) exacerbation: Secondary | ICD-10-CM | POA: Diagnosis not present

## 2017-05-25 DIAGNOSIS — Z9981 Dependence on supplemental oxygen: Secondary | ICD-10-CM | POA: Diagnosis not present

## 2017-05-25 DIAGNOSIS — Z7951 Long term (current) use of inhaled steroids: Secondary | ICD-10-CM | POA: Diagnosis not present

## 2017-05-25 DIAGNOSIS — E119 Type 2 diabetes mellitus without complications: Secondary | ICD-10-CM | POA: Diagnosis not present

## 2017-05-25 DIAGNOSIS — I10 Essential (primary) hypertension: Secondary | ICD-10-CM | POA: Diagnosis not present

## 2017-05-25 DIAGNOSIS — J441 Chronic obstructive pulmonary disease with (acute) exacerbation: Secondary | ICD-10-CM | POA: Diagnosis not present

## 2017-05-25 DIAGNOSIS — G4733 Obstructive sleep apnea (adult) (pediatric): Secondary | ICD-10-CM | POA: Diagnosis not present

## 2017-05-31 DIAGNOSIS — G4733 Obstructive sleep apnea (adult) (pediatric): Secondary | ICD-10-CM | POA: Diagnosis not present

## 2017-05-31 DIAGNOSIS — I10 Essential (primary) hypertension: Secondary | ICD-10-CM | POA: Diagnosis not present

## 2017-05-31 DIAGNOSIS — Z0001 Encounter for general adult medical examination with abnormal findings: Secondary | ICD-10-CM | POA: Diagnosis not present

## 2017-06-06 DIAGNOSIS — J441 Chronic obstructive pulmonary disease with (acute) exacerbation: Secondary | ICD-10-CM | POA: Diagnosis not present

## 2017-06-26 DIAGNOSIS — M79602 Pain in left arm: Secondary | ICD-10-CM | POA: Diagnosis not present

## 2017-06-26 DIAGNOSIS — R079 Chest pain, unspecified: Secondary | ICD-10-CM | POA: Diagnosis not present

## 2017-06-26 DIAGNOSIS — E119 Type 2 diabetes mellitus without complications: Secondary | ICD-10-CM | POA: Diagnosis not present

## 2017-06-26 DIAGNOSIS — Z87891 Personal history of nicotine dependence: Secondary | ICD-10-CM | POA: Diagnosis not present

## 2017-06-26 DIAGNOSIS — R0789 Other chest pain: Secondary | ICD-10-CM | POA: Diagnosis not present

## 2017-06-26 DIAGNOSIS — J449 Chronic obstructive pulmonary disease, unspecified: Secondary | ICD-10-CM | POA: Diagnosis not present

## 2017-06-26 DIAGNOSIS — Z79899 Other long term (current) drug therapy: Secondary | ICD-10-CM | POA: Diagnosis not present

## 2017-06-26 DIAGNOSIS — I10 Essential (primary) hypertension: Secondary | ICD-10-CM | POA: Diagnosis not present

## 2017-06-26 DIAGNOSIS — K4090 Unilateral inguinal hernia, without obstruction or gangrene, not specified as recurrent: Secondary | ICD-10-CM | POA: Diagnosis not present

## 2017-06-29 DIAGNOSIS — R1031 Right lower quadrant pain: Secondary | ICD-10-CM | POA: Diagnosis not present

## 2017-07-03 DIAGNOSIS — R1031 Right lower quadrant pain: Secondary | ICD-10-CM | POA: Diagnosis not present

## 2017-07-05 DIAGNOSIS — R1031 Right lower quadrant pain: Secondary | ICD-10-CM | POA: Diagnosis not present

## 2017-07-05 DIAGNOSIS — N200 Calculus of kidney: Secondary | ICD-10-CM | POA: Diagnosis not present

## 2017-07-05 DIAGNOSIS — R109 Unspecified abdominal pain: Secondary | ICD-10-CM | POA: Diagnosis not present

## 2017-07-17 DIAGNOSIS — K4090 Unilateral inguinal hernia, without obstruction or gangrene, not specified as recurrent: Secondary | ICD-10-CM | POA: Diagnosis not present

## 2017-07-30 DIAGNOSIS — J441 Chronic obstructive pulmonary disease with (acute) exacerbation: Secondary | ICD-10-CM | POA: Diagnosis not present

## 2017-07-30 DIAGNOSIS — R0902 Hypoxemia: Secondary | ICD-10-CM | POA: Diagnosis not present

## 2017-07-30 DIAGNOSIS — G4733 Obstructive sleep apnea (adult) (pediatric): Secondary | ICD-10-CM | POA: Diagnosis not present

## 2017-07-30 DIAGNOSIS — Z79899 Other long term (current) drug therapy: Secondary | ICD-10-CM | POA: Diagnosis not present

## 2017-07-30 DIAGNOSIS — Z87891 Personal history of nicotine dependence: Secondary | ICD-10-CM | POA: Diagnosis not present

## 2017-07-30 DIAGNOSIS — R05 Cough: Secondary | ICD-10-CM | POA: Diagnosis not present

## 2017-07-30 DIAGNOSIS — R51 Headache: Secondary | ICD-10-CM | POA: Diagnosis not present

## 2017-07-30 DIAGNOSIS — G4489 Other headache syndrome: Secondary | ICD-10-CM | POA: Diagnosis not present

## 2017-07-30 DIAGNOSIS — I10 Essential (primary) hypertension: Secondary | ICD-10-CM | POA: Diagnosis not present

## 2017-07-30 DIAGNOSIS — E119 Type 2 diabetes mellitus without complications: Secondary | ICD-10-CM | POA: Diagnosis not present

## 2017-08-03 DIAGNOSIS — G4733 Obstructive sleep apnea (adult) (pediatric): Secondary | ICD-10-CM | POA: Diagnosis not present

## 2017-08-03 DIAGNOSIS — E119 Type 2 diabetes mellitus without complications: Secondary | ICD-10-CM | POA: Diagnosis not present

## 2017-08-03 DIAGNOSIS — Z9981 Dependence on supplemental oxygen: Secondary | ICD-10-CM | POA: Diagnosis not present

## 2017-08-03 DIAGNOSIS — Z7951 Long term (current) use of inhaled steroids: Secondary | ICD-10-CM | POA: Diagnosis not present

## 2017-08-03 DIAGNOSIS — J449 Chronic obstructive pulmonary disease, unspecified: Secondary | ICD-10-CM | POA: Diagnosis not present

## 2017-08-03 DIAGNOSIS — R1310 Dysphagia, unspecified: Secondary | ICD-10-CM | POA: Diagnosis not present

## 2017-08-03 DIAGNOSIS — Z87891 Personal history of nicotine dependence: Secondary | ICD-10-CM | POA: Diagnosis not present

## 2017-08-04 DIAGNOSIS — Z87891 Personal history of nicotine dependence: Secondary | ICD-10-CM | POA: Diagnosis not present

## 2017-08-04 DIAGNOSIS — R1310 Dysphagia, unspecified: Secondary | ICD-10-CM | POA: Diagnosis not present

## 2017-08-04 DIAGNOSIS — Z9981 Dependence on supplemental oxygen: Secondary | ICD-10-CM | POA: Diagnosis not present

## 2017-08-04 DIAGNOSIS — J449 Chronic obstructive pulmonary disease, unspecified: Secondary | ICD-10-CM | POA: Diagnosis not present

## 2017-08-04 DIAGNOSIS — E119 Type 2 diabetes mellitus without complications: Secondary | ICD-10-CM | POA: Diagnosis not present

## 2017-08-04 DIAGNOSIS — Z7951 Long term (current) use of inhaled steroids: Secondary | ICD-10-CM | POA: Diagnosis not present

## 2017-08-04 DIAGNOSIS — G4733 Obstructive sleep apnea (adult) (pediatric): Secondary | ICD-10-CM | POA: Diagnosis not present

## 2017-08-07 DIAGNOSIS — G4733 Obstructive sleep apnea (adult) (pediatric): Secondary | ICD-10-CM | POA: Diagnosis not present

## 2017-08-07 DIAGNOSIS — E119 Type 2 diabetes mellitus without complications: Secondary | ICD-10-CM | POA: Diagnosis not present

## 2017-08-07 DIAGNOSIS — J449 Chronic obstructive pulmonary disease, unspecified: Secondary | ICD-10-CM | POA: Diagnosis not present

## 2017-08-07 DIAGNOSIS — Z7951 Long term (current) use of inhaled steroids: Secondary | ICD-10-CM | POA: Diagnosis not present

## 2017-08-07 DIAGNOSIS — R1310 Dysphagia, unspecified: Secondary | ICD-10-CM | POA: Diagnosis not present

## 2017-08-07 DIAGNOSIS — Z87891 Personal history of nicotine dependence: Secondary | ICD-10-CM | POA: Diagnosis not present

## 2017-08-07 DIAGNOSIS — Z9981 Dependence on supplemental oxygen: Secondary | ICD-10-CM | POA: Diagnosis not present

## 2017-08-08 DIAGNOSIS — R1310 Dysphagia, unspecified: Secondary | ICD-10-CM | POA: Diagnosis not present

## 2017-08-08 DIAGNOSIS — G4733 Obstructive sleep apnea (adult) (pediatric): Secondary | ICD-10-CM | POA: Diagnosis not present

## 2017-08-08 DIAGNOSIS — Z7951 Long term (current) use of inhaled steroids: Secondary | ICD-10-CM | POA: Diagnosis not present

## 2017-08-08 DIAGNOSIS — E119 Type 2 diabetes mellitus without complications: Secondary | ICD-10-CM | POA: Diagnosis not present

## 2017-08-08 DIAGNOSIS — J449 Chronic obstructive pulmonary disease, unspecified: Secondary | ICD-10-CM | POA: Diagnosis not present

## 2017-08-08 DIAGNOSIS — Z87891 Personal history of nicotine dependence: Secondary | ICD-10-CM | POA: Diagnosis not present

## 2017-08-08 DIAGNOSIS — Z9981 Dependence on supplemental oxygen: Secondary | ICD-10-CM | POA: Diagnosis not present

## 2017-08-09 DIAGNOSIS — Z7951 Long term (current) use of inhaled steroids: Secondary | ICD-10-CM | POA: Diagnosis not present

## 2017-08-09 DIAGNOSIS — Z9981 Dependence on supplemental oxygen: Secondary | ICD-10-CM | POA: Diagnosis not present

## 2017-08-09 DIAGNOSIS — R1310 Dysphagia, unspecified: Secondary | ICD-10-CM | POA: Diagnosis not present

## 2017-08-09 DIAGNOSIS — Z87891 Personal history of nicotine dependence: Secondary | ICD-10-CM | POA: Diagnosis not present

## 2017-08-09 DIAGNOSIS — G4733 Obstructive sleep apnea (adult) (pediatric): Secondary | ICD-10-CM | POA: Diagnosis not present

## 2017-08-09 DIAGNOSIS — J449 Chronic obstructive pulmonary disease, unspecified: Secondary | ICD-10-CM | POA: Diagnosis not present

## 2017-08-09 DIAGNOSIS — E119 Type 2 diabetes mellitus without complications: Secondary | ICD-10-CM | POA: Diagnosis not present

## 2017-08-10 DIAGNOSIS — Z87891 Personal history of nicotine dependence: Secondary | ICD-10-CM | POA: Diagnosis not present

## 2017-08-10 DIAGNOSIS — Z9981 Dependence on supplemental oxygen: Secondary | ICD-10-CM | POA: Diagnosis not present

## 2017-08-10 DIAGNOSIS — G4733 Obstructive sleep apnea (adult) (pediatric): Secondary | ICD-10-CM | POA: Diagnosis not present

## 2017-08-10 DIAGNOSIS — R1310 Dysphagia, unspecified: Secondary | ICD-10-CM | POA: Diagnosis not present

## 2017-08-10 DIAGNOSIS — Z7951 Long term (current) use of inhaled steroids: Secondary | ICD-10-CM | POA: Diagnosis not present

## 2017-08-10 DIAGNOSIS — J449 Chronic obstructive pulmonary disease, unspecified: Secondary | ICD-10-CM | POA: Diagnosis not present

## 2017-08-10 DIAGNOSIS — E119 Type 2 diabetes mellitus without complications: Secondary | ICD-10-CM | POA: Diagnosis not present

## 2017-08-11 DIAGNOSIS — Z7951 Long term (current) use of inhaled steroids: Secondary | ICD-10-CM | POA: Diagnosis not present

## 2017-08-11 DIAGNOSIS — R1310 Dysphagia, unspecified: Secondary | ICD-10-CM | POA: Diagnosis not present

## 2017-08-11 DIAGNOSIS — Z87891 Personal history of nicotine dependence: Secondary | ICD-10-CM | POA: Diagnosis not present

## 2017-08-11 DIAGNOSIS — Z9981 Dependence on supplemental oxygen: Secondary | ICD-10-CM | POA: Diagnosis not present

## 2017-08-11 DIAGNOSIS — J449 Chronic obstructive pulmonary disease, unspecified: Secondary | ICD-10-CM | POA: Diagnosis not present

## 2017-08-11 DIAGNOSIS — G4733 Obstructive sleep apnea (adult) (pediatric): Secondary | ICD-10-CM | POA: Diagnosis not present

## 2017-08-11 DIAGNOSIS — E119 Type 2 diabetes mellitus without complications: Secondary | ICD-10-CM | POA: Diagnosis not present

## 2017-08-16 DIAGNOSIS — J449 Chronic obstructive pulmonary disease, unspecified: Secondary | ICD-10-CM | POA: Diagnosis not present

## 2017-08-16 DIAGNOSIS — E119 Type 2 diabetes mellitus without complications: Secondary | ICD-10-CM | POA: Diagnosis not present

## 2017-08-16 DIAGNOSIS — G4733 Obstructive sleep apnea (adult) (pediatric): Secondary | ICD-10-CM | POA: Diagnosis not present

## 2017-08-16 DIAGNOSIS — R1310 Dysphagia, unspecified: Secondary | ICD-10-CM | POA: Diagnosis not present

## 2017-08-16 DIAGNOSIS — Z87891 Personal history of nicotine dependence: Secondary | ICD-10-CM | POA: Diagnosis not present

## 2017-08-16 DIAGNOSIS — Z7951 Long term (current) use of inhaled steroids: Secondary | ICD-10-CM | POA: Diagnosis not present

## 2017-08-16 DIAGNOSIS — Z9981 Dependence on supplemental oxygen: Secondary | ICD-10-CM | POA: Diagnosis not present

## 2017-08-25 DIAGNOSIS — E119 Type 2 diabetes mellitus without complications: Secondary | ICD-10-CM | POA: Diagnosis not present

## 2017-08-25 DIAGNOSIS — Z9981 Dependence on supplemental oxygen: Secondary | ICD-10-CM | POA: Diagnosis not present

## 2017-08-25 DIAGNOSIS — Z87891 Personal history of nicotine dependence: Secondary | ICD-10-CM | POA: Diagnosis not present

## 2017-08-25 DIAGNOSIS — G4733 Obstructive sleep apnea (adult) (pediatric): Secondary | ICD-10-CM | POA: Diagnosis not present

## 2017-08-25 DIAGNOSIS — Z7951 Long term (current) use of inhaled steroids: Secondary | ICD-10-CM | POA: Diagnosis not present

## 2017-08-25 DIAGNOSIS — J449 Chronic obstructive pulmonary disease, unspecified: Secondary | ICD-10-CM | POA: Diagnosis not present

## 2017-08-25 DIAGNOSIS — R1310 Dysphagia, unspecified: Secondary | ICD-10-CM | POA: Diagnosis not present

## 2017-09-08 DIAGNOSIS — Z87442 Personal history of urinary calculi: Secondary | ICD-10-CM | POA: Diagnosis not present

## 2017-09-08 DIAGNOSIS — Z87440 Personal history of urinary (tract) infections: Secondary | ICD-10-CM | POA: Diagnosis not present

## 2017-09-08 DIAGNOSIS — R1084 Generalized abdominal pain: Secondary | ICD-10-CM | POA: Diagnosis not present

## 2017-09-08 DIAGNOSIS — E119 Type 2 diabetes mellitus without complications: Secondary | ICD-10-CM | POA: Diagnosis not present

## 2017-09-08 DIAGNOSIS — R109 Unspecified abdominal pain: Secondary | ICD-10-CM | POA: Diagnosis not present

## 2017-09-08 DIAGNOSIS — Z87891 Personal history of nicotine dependence: Secondary | ICD-10-CM | POA: Diagnosis not present

## 2017-09-08 DIAGNOSIS — J449 Chronic obstructive pulmonary disease, unspecified: Secondary | ICD-10-CM | POA: Diagnosis not present

## 2017-09-08 DIAGNOSIS — Z79899 Other long term (current) drug therapy: Secondary | ICD-10-CM | POA: Diagnosis not present

## 2017-09-09 DIAGNOSIS — R109 Unspecified abdominal pain: Secondary | ICD-10-CM | POA: Diagnosis not present

## 2017-09-17 DIAGNOSIS — J449 Chronic obstructive pulmonary disease, unspecified: Secondary | ICD-10-CM | POA: Diagnosis not present

## 2017-09-17 DIAGNOSIS — Z87891 Personal history of nicotine dependence: Secondary | ICD-10-CM | POA: Diagnosis not present

## 2017-09-17 DIAGNOSIS — Z79899 Other long term (current) drug therapy: Secondary | ICD-10-CM | POA: Diagnosis not present

## 2017-09-17 DIAGNOSIS — M545 Low back pain: Secondary | ICD-10-CM | POA: Diagnosis not present

## 2017-09-17 DIAGNOSIS — I10 Essential (primary) hypertension: Secondary | ICD-10-CM | POA: Diagnosis not present

## 2017-09-17 DIAGNOSIS — E119 Type 2 diabetes mellitus without complications: Secondary | ICD-10-CM | POA: Diagnosis not present

## 2017-09-17 DIAGNOSIS — M546 Pain in thoracic spine: Secondary | ICD-10-CM | POA: Diagnosis not present

## 2017-10-26 DIAGNOSIS — Z1331 Encounter for screening for depression: Secondary | ICD-10-CM | POA: Diagnosis not present

## 2017-10-26 DIAGNOSIS — K219 Gastro-esophageal reflux disease without esophagitis: Secondary | ICD-10-CM | POA: Diagnosis not present

## 2017-10-26 DIAGNOSIS — Z1389 Encounter for screening for other disorder: Secondary | ICD-10-CM | POA: Diagnosis not present

## 2017-10-26 DIAGNOSIS — I10 Essential (primary) hypertension: Secondary | ICD-10-CM | POA: Diagnosis not present

## 2017-10-26 DIAGNOSIS — J4542 Moderate persistent asthma with status asthmaticus: Secondary | ICD-10-CM | POA: Diagnosis not present

## 2017-12-14 DIAGNOSIS — R140 Abdominal distension (gaseous): Secondary | ICD-10-CM | POA: Diagnosis not present

## 2017-12-14 DIAGNOSIS — J449 Chronic obstructive pulmonary disease, unspecified: Secondary | ICD-10-CM | POA: Diagnosis not present

## 2017-12-14 DIAGNOSIS — K224 Dyskinesia of esophagus: Secondary | ICD-10-CM | POA: Diagnosis not present

## 2017-12-14 DIAGNOSIS — R0602 Shortness of breath: Secondary | ICD-10-CM | POA: Diagnosis not present

## 2017-12-14 DIAGNOSIS — K2970 Gastritis, unspecified, without bleeding: Secondary | ICD-10-CM | POA: Diagnosis not present

## 2017-12-14 DIAGNOSIS — Z79899 Other long term (current) drug therapy: Secondary | ICD-10-CM | POA: Diagnosis not present

## 2017-12-14 DIAGNOSIS — N390 Urinary tract infection, site not specified: Secondary | ICD-10-CM | POA: Diagnosis not present

## 2017-12-14 DIAGNOSIS — Z87442 Personal history of urinary calculi: Secondary | ICD-10-CM | POA: Diagnosis not present

## 2017-12-14 DIAGNOSIS — K222 Esophageal obstruction: Secondary | ICD-10-CM | POA: Diagnosis not present

## 2017-12-14 DIAGNOSIS — E119 Type 2 diabetes mellitus without complications: Secondary | ICD-10-CM | POA: Diagnosis not present

## 2017-12-14 DIAGNOSIS — R110 Nausea: Secondary | ICD-10-CM | POA: Diagnosis not present

## 2017-12-14 DIAGNOSIS — K449 Diaphragmatic hernia without obstruction or gangrene: Secondary | ICD-10-CM | POA: Diagnosis not present

## 2017-12-14 DIAGNOSIS — I10 Essential (primary) hypertension: Secondary | ICD-10-CM | POA: Diagnosis not present

## 2017-12-14 DIAGNOSIS — Z87891 Personal history of nicotine dependence: Secondary | ICD-10-CM | POA: Diagnosis not present

## 2018-01-02 DIAGNOSIS — K219 Gastro-esophageal reflux disease without esophagitis: Secondary | ICD-10-CM | POA: Diagnosis not present

## 2018-01-15 ENCOUNTER — Encounter (INDEPENDENT_AMBULATORY_CARE_PROVIDER_SITE_OTHER): Payer: Self-pay | Admitting: Internal Medicine

## 2018-01-15 ENCOUNTER — Ambulatory Visit (INDEPENDENT_AMBULATORY_CARE_PROVIDER_SITE_OTHER): Payer: Medicare Other | Admitting: Internal Medicine

## 2018-01-15 VITALS — BP 190/90 | HR 60 | Temp 97.5°F | Ht 69.0 in | Wt 320.5 lb

## 2018-01-15 DIAGNOSIS — R1310 Dysphagia, unspecified: Secondary | ICD-10-CM | POA: Diagnosis not present

## 2018-01-15 DIAGNOSIS — K219 Gastro-esophageal reflux disease without esophagitis: Secondary | ICD-10-CM | POA: Diagnosis not present

## 2018-01-15 DIAGNOSIS — R1319 Other dysphagia: Secondary | ICD-10-CM

## 2018-01-15 MED ORDER — PANTOPRAZOLE SODIUM 40 MG PO TBEC: 40.0000 mg | DELAYED_RELEASE_TABLET | Freq: Every day | ORAL | 3 refills | Status: DC

## 2018-01-15 NOTE — Progress Notes (Addendum)
Subjective:    Patient ID: Charles Mathews, male    DOB: 1961-01-04, 57 y.o.   MRN: [id number] Did not bring medication Caregiver had [facility] fax over med list. This list is updated.  HPI Referred by Dr. Nadara Mustard for GERD/EGD. He tells me he is having acid reflux.  Takes Zantac twice a day for his GERD. He also tells me he is having dysphagia. Foods are lodging in his esophagus. Symptoms for :"a long time".  Patient states he has bipolar and depression. Aunt is present in room.  His appetite is good. He drinks a lot of liquids because of the dysphagia. Has a BM x 1 a day. No trauma.    Review of Systems Past Medical History:  Diagnosis Date  . Anxiety   . Arthritis   . Asthma    Albuterol prn  . Chronic back pain   . Chronic kidney disease    kidney stones  . Coronary artery disease    11/01/12 he denies known CAD/MI/CHF history  . Diabetes mellitus    borderline  . Gastric ulcer   . GERD (gastroesophageal reflux disease)    takes Prilosec daily  . High cholesterol    takes zocor daily  . Hypertension    takes metoprolol daily and cardura  . Insomnia   . Joint pain   . Lung mass   . Major depression    takes Zoloft and effexor daily  . Migraine    last one couple nights ago and takes Topamax daily  . Morbid obesity ([place])   . Neuropathy   . Seizures ([place])    last one a couple of months ago;takes cogentin daily  . Shortness of breath    with exertion  . Urinary frequency   . Urinary urgency     Past Surgical History:  Procedure Laterality Date  . LITHOTRIPSY  2009  . MULTIPLE EXTRACTIONS WITH ALVEOLOPLASTY N/A 12/17/2012   Procedure: MULTIPLE EXTRACTION WITH ALVEOLOPLASTY;  Surgeon: Gae Bon, DDS;  Location: [facility];  Service: Oral Surgery;  Laterality: N/A;    Allergies  Allergen Reactions  . Bee Venom Anaphylaxis  . Benadryl [Diphenhydramine]     Makes him feel weird, like he is going to pass out  . Lorazepam Other (See Comments)    Makes him feel  woozy.  Marland Kitchen Penicillins Other (See Comments)    CHILDHOOD ALLERGY    Current Outpatient Medications on File Prior to Visit  Medication Sig Dispense Refill  . albuterol (PROVENTIL HFA;VENTOLIN HFA) 108 (90 BASE) MCG/ACT inhaler Inhale 2 puffs into the lungs every 6 (six) hours as needed for wheezing.    Marland Kitchen albuterol (PROVENTIL) (2.5 MG/3ML) 0.083% nebulizer solution Take 2.5 mg by nebulization every 6 (six) hours as needed for wheezing.    . benzonatate (TESSALON) 200 MG capsule Take 1 capsule (200 mg total) by mouth 3 (three) times daily as needed for cough. 30 capsule 1  . benztropine (COGENTIN) 1 MG tablet Take 1 mg by mouth at bedtime.      Marland Kitchen doxazosin (CARDURA) 2 MG tablet Take 2 mg by mouth every morning.     . ezetimibe (ZETIA) 10 MG tablet Take 10 mg by mouth at bedtime.      . Fluticasone-Salmeterol (ADVAIR) 100-50 MCG/DOSE AEPB Inhale 1 puff into the lungs every 12 (twelve) hours.    Marland Kitchen ibuprofen (ADVIL,MOTRIN) 200 MG tablet Take 200 mg by mouth every 6 (six) hours as needed for pain.    Marland Kitchen  metoprolol (LOPRESSOR) 50 MG tablet Take 50 mg by mouth 2 (two) times daily.      Marland Kitchen omeprazole (PRILOSEC) 20 MG capsule Take 20 mg by mouth daily.      Marland Kitchen oxyCODONE-acetaminophen (PERCOCET) 5-325 MG per tablet Take 1-2 tablets by mouth every 4 (four) hours as needed for pain. 40 tablet 0  . paliperidone (INVEGA) 3 MG 24 hr tablet Take 3 mg by mouth every morning.    . ranitidine (ZANTAC) 150 MG tablet Take 150 mg by mouth 2 (two) times daily.    . sertraline (ZOLOFT) 100 MG tablet Take 200 mg by mouth daily.    . simvastatin (ZOCOR) 40 MG tablet Take 40 mg by mouth at bedtime.      . topiramate (TOPAMAX) 50 MG tablet Take 50 mg by mouth 2 (two) times daily.    Marland Kitchen venlafaxine XR (EFFEXOR-XR) 150 MG 24 hr capsule Take 150 mg by mouth 2 (two) times daily. One each morning and one daily at noon     No current facility-administered medications on file prior to visit.         Objective:   Physical Exam  Blood pressure (!) 190/90, pulse 60, temperature (!) 97.5 F (36.4 C), height 5\' 9"  (1.753 m), weight (!) 320 lb 8 oz (145.4 kg). Alert and oriented. Skin warm and dry. Oral mucosa is moist.   . Sclera anicteric, conjunctivae is pink. Thyroid not enlarged. No cervical lymphadenopathy. Lungs clear. Heart regular rate and rhythm.  Abdomen is soft. Bowel sounds are positive. No hepatomegaly. No abdominal masses felt. Tenderness across posterior rt rib cage  1-2 + edema to lower extremities.  Grossly obese.          Assessment & Plan:  GERD. Am goig to start him on Protonix daily. Dysphagia. Am going to get an Esophagram 1st before proceeding with an EGD/ED Aunt will bring list of medication by.  Further recommendations to follow.

## 2018-01-15 NOTE — Patient Instructions (Signed)
Stop the Zantac Am going to start him on Protonix 40mg  daily.

## 2018-01-17 ENCOUNTER — Telehealth (INDEPENDENT_AMBULATORY_CARE_PROVIDER_SITE_OTHER): Payer: Self-pay | Admitting: Internal Medicine

## 2018-01-17 DIAGNOSIS — I10 Essential (primary) hypertension: Secondary | ICD-10-CM | POA: Diagnosis not present

## 2018-01-17 NOTE — Telephone Encounter (Signed)
Patients caregiver called stated patient gets his meds from [facility] and asked if you could call them and get his list of medications.  She stated she and he does not know all of the meds he takes - any questions call her at [phone]

## 2018-01-19 ENCOUNTER — Ambulatory Visit (HOSPITAL_COMMUNITY)
Admission: RE | Admit: 2018-01-19 | Discharge: 2018-01-19 | Disposition: A | Discharge status: Home / Self Care | Payer: Medicare Other | Source: Ambulatory Visit | Attending: Internal Medicine | Admitting: Internal Medicine

## 2018-01-19 DIAGNOSIS — R1310 Dysphagia, unspecified: Secondary | ICD-10-CM | POA: Diagnosis not present

## 2018-01-19 DIAGNOSIS — K224 Dyskinesia of esophagus: Secondary | ICD-10-CM | POA: Diagnosis not present

## 2018-01-19 DIAGNOSIS — R1319 Other dysphagia: Secondary | ICD-10-CM

## 2018-01-22 NOTE — Telephone Encounter (Signed)
I advised Charles Mathews to call [facility] and have them fax a list of his medications.

## 2018-01-23 ENCOUNTER — Telehealth (INDEPENDENT_AMBULATORY_CARE_PROVIDER_SITE_OTHER): Payer: Self-pay | Admitting: *Deleted

## 2018-01-23 NOTE — Telephone Encounter (Signed)
Charles Mathews , Charles Mathews called and says that they are going out to [facility] to sign a paper that his medications may be sent to our office. She gave these two names that he got at the ED> Pantoprazole 40 mg - take 1 po daily , Generic Carafate 1 gram he takes 5 times daily.  She states that he is running low on both of them.  She ask that if you need to talk with her to call [phone].

## 2018-01-24 NOTE — Telephone Encounter (Signed)
noted 

## 2018-01-24 NOTE — Telephone Encounter (Signed)
Patient caregiver is going to call [facility] and get the list

## 2018-02-08 DIAGNOSIS — E1140 Type 2 diabetes mellitus with diabetic neuropathy, unspecified: Secondary | ICD-10-CM | POA: Diagnosis not present

## 2018-02-08 DIAGNOSIS — N23 Unspecified renal colic: Secondary | ICD-10-CM | POA: Diagnosis not present

## 2018-02-08 DIAGNOSIS — I10 Essential (primary) hypertension: Secondary | ICD-10-CM | POA: Diagnosis not present

## 2018-02-08 DIAGNOSIS — R0902 Hypoxemia: Secondary | ICD-10-CM | POA: Diagnosis not present

## 2018-02-08 DIAGNOSIS — R52 Pain, unspecified: Secondary | ICD-10-CM | POA: Diagnosis not present

## 2018-02-08 DIAGNOSIS — R109 Unspecified abdominal pain: Secondary | ICD-10-CM | POA: Diagnosis not present

## 2018-02-08 DIAGNOSIS — Z79899 Other long term (current) drug therapy: Secondary | ICD-10-CM | POA: Diagnosis not present

## 2018-02-08 DIAGNOSIS — R1031 Right lower quadrant pain: Secondary | ICD-10-CM | POA: Diagnosis not present

## 2018-02-08 DIAGNOSIS — N132 Hydronephrosis with renal and ureteral calculous obstruction: Secondary | ICD-10-CM | POA: Diagnosis not present

## 2018-02-08 DIAGNOSIS — J449 Chronic obstructive pulmonary disease, unspecified: Secondary | ICD-10-CM | POA: Diagnosis not present

## 2018-02-08 DIAGNOSIS — N202 Calculus of kidney with calculus of ureter: Secondary | ICD-10-CM | POA: Diagnosis not present

## 2018-02-09 NOTE — Addendum Note (Signed)
Addended by: Butch Penny on: 02/09/2018 10:30 AM   Modules accepted: Orders

## 2018-02-09 NOTE — Telephone Encounter (Signed)
Received undate medication list Monday.02/05/2018

## 2018-02-13 DIAGNOSIS — N200 Calculus of kidney: Secondary | ICD-10-CM | POA: Diagnosis not present

## 2018-02-13 DIAGNOSIS — R739 Hyperglycemia, unspecified: Secondary | ICD-10-CM | POA: Diagnosis not present

## 2018-02-13 DIAGNOSIS — I10 Essential (primary) hypertension: Secondary | ICD-10-CM | POA: Diagnosis not present

## 2018-02-28 DIAGNOSIS — N200 Calculus of kidney: Secondary | ICD-10-CM | POA: Diagnosis not present

## 2018-03-03 DIAGNOSIS — Z79899 Other long term (current) drug therapy: Secondary | ICD-10-CM | POA: Diagnosis not present

## 2018-03-03 DIAGNOSIS — Z880 Allergy status to penicillin: Secondary | ICD-10-CM | POA: Diagnosis not present

## 2018-03-03 DIAGNOSIS — R5381 Other malaise: Secondary | ICD-10-CM | POA: Diagnosis not present

## 2018-03-03 DIAGNOSIS — G4730 Sleep apnea, unspecified: Secondary | ICD-10-CM | POA: Diagnosis not present

## 2018-03-03 DIAGNOSIS — I10 Essential (primary) hypertension: Secondary | ICD-10-CM | POA: Diagnosis not present

## 2018-03-03 DIAGNOSIS — N202 Calculus of kidney with calculus of ureter: Secondary | ICD-10-CM | POA: Diagnosis not present

## 2018-03-03 DIAGNOSIS — J449 Chronic obstructive pulmonary disease, unspecified: Secondary | ICD-10-CM | POA: Diagnosis not present

## 2018-03-03 DIAGNOSIS — M546 Pain in thoracic spine: Secondary | ICD-10-CM | POA: Diagnosis not present

## 2018-03-03 DIAGNOSIS — Z87891 Personal history of nicotine dependence: Secondary | ICD-10-CM | POA: Diagnosis not present

## 2018-03-03 DIAGNOSIS — E119 Type 2 diabetes mellitus without complications: Secondary | ICD-10-CM | POA: Diagnosis not present

## 2018-03-03 DIAGNOSIS — Z888 Allergy status to other drugs, medicaments and biological substances status: Secondary | ICD-10-CM | POA: Diagnosis not present

## 2018-03-03 DIAGNOSIS — N200 Calculus of kidney: Secondary | ICD-10-CM | POA: Diagnosis not present

## 2018-03-24 DIAGNOSIS — R1031 Right lower quadrant pain: Secondary | ICD-10-CM | POA: Diagnosis not present

## 2018-03-24 DIAGNOSIS — R109 Unspecified abdominal pain: Secondary | ICD-10-CM | POA: Diagnosis not present

## 2018-03-24 DIAGNOSIS — R0902 Hypoxemia: Secondary | ICD-10-CM | POA: Diagnosis not present

## 2018-03-24 DIAGNOSIS — N200 Calculus of kidney: Secondary | ICD-10-CM | POA: Diagnosis not present

## 2018-04-13 DIAGNOSIS — I10 Essential (primary) hypertension: Secondary | ICD-10-CM | POA: Diagnosis not present

## 2018-04-13 DIAGNOSIS — R1084 Generalized abdominal pain: Secondary | ICD-10-CM | POA: Diagnosis not present

## 2018-04-13 DIAGNOSIS — K219 Gastro-esophageal reflux disease without esophagitis: Secondary | ICD-10-CM | POA: Diagnosis not present

## 2018-04-13 DIAGNOSIS — N200 Calculus of kidney: Secondary | ICD-10-CM | POA: Diagnosis not present

## 2018-04-13 DIAGNOSIS — R05 Cough: Secondary | ICD-10-CM | POA: Diagnosis not present

## 2018-04-17 ENCOUNTER — Other Ambulatory Visit (HOSPITAL_COMMUNITY): Payer: Self-pay | Admitting: Family Medicine

## 2018-04-17 ENCOUNTER — Ambulatory Visit (INDEPENDENT_AMBULATORY_CARE_PROVIDER_SITE_OTHER): Payer: Medicare Other | Admitting: Urology

## 2018-04-17 ENCOUNTER — Ambulatory Visit (HOSPITAL_COMMUNITY)
Admission: RE | Admit: 2018-04-17 | Discharge: 2018-04-17 | Disposition: A | Discharge status: Home / Self Care | Payer: Medicare Other | Source: Ambulatory Visit | Attending: Family Medicine | Admitting: Family Medicine

## 2018-04-17 DIAGNOSIS — N202 Calculus of kidney with calculus of ureter: Secondary | ICD-10-CM

## 2018-04-17 DIAGNOSIS — N200 Calculus of kidney: Secondary | ICD-10-CM | POA: Insufficient documentation

## 2018-05-03 DIAGNOSIS — I10 Essential (primary) hypertension: Secondary | ICD-10-CM | POA: Diagnosis not present

## 2018-05-03 DIAGNOSIS — R739 Hyperglycemia, unspecified: Secondary | ICD-10-CM | POA: Diagnosis not present

## 2018-05-03 DIAGNOSIS — G4733 Obstructive sleep apnea (adult) (pediatric): Secondary | ICD-10-CM | POA: Diagnosis not present

## 2018-05-07 DIAGNOSIS — Z0001 Encounter for general adult medical examination with abnormal findings: Secondary | ICD-10-CM | POA: Diagnosis not present

## 2018-05-07 DIAGNOSIS — I10 Essential (primary) hypertension: Secondary | ICD-10-CM | POA: Diagnosis not present

## 2018-06-04 DIAGNOSIS — I10 Essential (primary) hypertension: Secondary | ICD-10-CM | POA: Diagnosis not present

## 2018-06-04 DIAGNOSIS — J441 Chronic obstructive pulmonary disease with (acute) exacerbation: Secondary | ICD-10-CM | POA: Diagnosis not present

## 2018-06-04 DIAGNOSIS — Z87891 Personal history of nicotine dependence: Secondary | ICD-10-CM | POA: Diagnosis not present

## 2018-06-04 DIAGNOSIS — R0602 Shortness of breath: Secondary | ICD-10-CM | POA: Diagnosis not present

## 2018-06-04 DIAGNOSIS — E1140 Type 2 diabetes mellitus with diabetic neuropathy, unspecified: Secondary | ICD-10-CM | POA: Diagnosis not present

## 2018-06-04 DIAGNOSIS — R0902 Hypoxemia: Secondary | ICD-10-CM | POA: Diagnosis not present

## 2018-06-05 ENCOUNTER — Ambulatory Visit: Payer: Medicare Other | Admitting: Urology

## 2018-06-13 ENCOUNTER — Ambulatory Visit (INDEPENDENT_AMBULATORY_CARE_PROVIDER_SITE_OTHER): Payer: Medicare Other | Admitting: Urology

## 2018-06-13 ENCOUNTER — Other Ambulatory Visit: Payer: Self-pay | Admitting: Urology

## 2018-06-13 ENCOUNTER — Other Ambulatory Visit (HOSPITAL_COMMUNITY)
Admission: AD | Admit: 2018-06-13 | Discharge: 2018-06-13 | Disposition: A | Discharge status: Home / Self Care | Payer: Medicare Other | Source: Skilled Nursing Facility | Attending: Urology | Admitting: Urology

## 2018-06-13 ENCOUNTER — Ambulatory Visit (HOSPITAL_COMMUNITY)
Admission: RE | Admit: 2018-06-13 | Discharge: 2018-06-13 | Disposition: A | Discharge status: Home / Self Care | Payer: Medicare Other | Source: Ambulatory Visit | Attending: Urology | Admitting: Urology

## 2018-06-13 DIAGNOSIS — N202 Calculus of kidney with calculus of ureter: Secondary | ICD-10-CM

## 2018-06-13 DIAGNOSIS — N201 Calculus of ureter: Secondary | ICD-10-CM

## 2018-06-13 DIAGNOSIS — Z87442 Personal history of urinary calculi: Secondary | ICD-10-CM | POA: Insufficient documentation

## 2018-06-13 DIAGNOSIS — N200 Calculus of kidney: Secondary | ICD-10-CM | POA: Diagnosis not present

## 2018-06-17 DIAGNOSIS — I10 Essential (primary) hypertension: Secondary | ICD-10-CM | POA: Diagnosis not present

## 2018-06-17 DIAGNOSIS — R0902 Hypoxemia: Secondary | ICD-10-CM | POA: Diagnosis not present

## 2018-06-17 DIAGNOSIS — Z87891 Personal history of nicotine dependence: Secondary | ICD-10-CM | POA: Diagnosis not present

## 2018-06-17 DIAGNOSIS — R7989 Other specified abnormal findings of blood chemistry: Secondary | ICD-10-CM | POA: Diagnosis not present

## 2018-06-17 DIAGNOSIS — E119 Type 2 diabetes mellitus without complications: Secondary | ICD-10-CM | POA: Diagnosis not present

## 2018-06-17 DIAGNOSIS — R51 Headache: Secondary | ICD-10-CM | POA: Diagnosis not present

## 2018-06-17 DIAGNOSIS — R079 Chest pain, unspecified: Secondary | ICD-10-CM | POA: Diagnosis not present

## 2018-06-17 DIAGNOSIS — G4730 Sleep apnea, unspecified: Secondary | ICD-10-CM | POA: Diagnosis not present

## 2018-06-17 DIAGNOSIS — H9209 Otalgia, unspecified ear: Secondary | ICD-10-CM | POA: Diagnosis not present

## 2018-06-17 DIAGNOSIS — G4489 Other headache syndrome: Secondary | ICD-10-CM | POA: Diagnosis not present

## 2018-06-17 DIAGNOSIS — J449 Chronic obstructive pulmonary disease, unspecified: Secondary | ICD-10-CM | POA: Diagnosis not present

## 2018-06-17 DIAGNOSIS — R002 Palpitations: Secondary | ICD-10-CM | POA: Diagnosis not present

## 2018-06-18 DIAGNOSIS — I10 Essential (primary) hypertension: Secondary | ICD-10-CM | POA: Diagnosis not present

## 2018-06-18 DIAGNOSIS — G43909 Migraine, unspecified, not intractable, without status migrainosus: Secondary | ICD-10-CM | POA: Diagnosis not present

## 2018-06-18 DIAGNOSIS — R569 Unspecified convulsions: Secondary | ICD-10-CM | POA: Diagnosis not present

## 2018-06-18 DIAGNOSIS — J449 Chronic obstructive pulmonary disease, unspecified: Secondary | ICD-10-CM | POA: Diagnosis not present

## 2018-06-18 DIAGNOSIS — E785 Hyperlipidemia, unspecified: Secondary | ICD-10-CM | POA: Diagnosis not present

## 2018-06-18 DIAGNOSIS — R0602 Shortness of breath: Secondary | ICD-10-CM | POA: Diagnosis not present

## 2018-06-18 DIAGNOSIS — I159 Secondary hypertension, unspecified: Secondary | ICD-10-CM | POA: Diagnosis not present

## 2018-06-18 DIAGNOSIS — E876 Hypokalemia: Secondary | ICD-10-CM | POA: Diagnosis not present

## 2018-06-18 DIAGNOSIS — K219 Gastro-esophageal reflux disease without esophagitis: Secondary | ICD-10-CM | POA: Diagnosis not present

## 2018-06-18 DIAGNOSIS — I361 Nonrheumatic tricuspid (valve) insufficiency: Secondary | ICD-10-CM | POA: Diagnosis not present

## 2018-06-18 DIAGNOSIS — I340 Nonrheumatic mitral (valve) insufficiency: Secondary | ICD-10-CM | POA: Diagnosis not present

## 2018-06-18 DIAGNOSIS — Z9981 Dependence on supplemental oxygen: Secondary | ICD-10-CM | POA: Diagnosis not present

## 2018-06-18 DIAGNOSIS — R51 Headache: Secondary | ICD-10-CM | POA: Diagnosis not present

## 2018-06-18 DIAGNOSIS — I2510 Atherosclerotic heart disease of native coronary artery without angina pectoris: Secondary | ICD-10-CM | POA: Diagnosis not present

## 2018-06-18 DIAGNOSIS — R748 Abnormal levels of other serum enzymes: Secondary | ICD-10-CM | POA: Diagnosis not present

## 2018-06-18 DIAGNOSIS — R7989 Other specified abnormal findings of blood chemistry: Secondary | ICD-10-CM | POA: Diagnosis not present

## 2018-06-18 DIAGNOSIS — R079 Chest pain, unspecified: Secondary | ICD-10-CM | POA: Diagnosis not present

## 2018-06-18 DIAGNOSIS — E8770 Fluid overload, unspecified: Secondary | ICD-10-CM | POA: Diagnosis not present

## 2018-06-20 LAB — STONE ANALYSIS
CA OXALATE, DIHYDRATE: 15 %
CA PHOS CRY STONE QL IR: 35 %
Ca Oxalate,Monohydr.: 50 %
STONE WEIGHT KSTONE: 148 mg

## 2018-06-25 DIAGNOSIS — I10 Essential (primary) hypertension: Secondary | ICD-10-CM | POA: Diagnosis not present

## 2018-06-25 DIAGNOSIS — I2510 Atherosclerotic heart disease of native coronary artery without angina pectoris: Secondary | ICD-10-CM | POA: Diagnosis not present

## 2018-06-25 DIAGNOSIS — J449 Chronic obstructive pulmonary disease, unspecified: Secondary | ICD-10-CM | POA: Diagnosis not present

## 2018-06-27 ENCOUNTER — Ambulatory Visit (HOSPITAL_COMMUNITY)
Admission: RE | Admit: 2018-06-27 | Discharge: 2018-06-27 | Disposition: A | Discharge status: Home / Self Care | Payer: Medicare Other | Source: Ambulatory Visit | Attending: Urology | Admitting: Urology

## 2018-06-27 ENCOUNTER — Ambulatory Visit (INDEPENDENT_AMBULATORY_CARE_PROVIDER_SITE_OTHER): Payer: Medicare Other | Admitting: Urology

## 2018-06-27 ENCOUNTER — Other Ambulatory Visit: Payer: Self-pay | Admitting: Urology

## 2018-06-27 DIAGNOSIS — N201 Calculus of ureter: Secondary | ICD-10-CM

## 2018-06-27 DIAGNOSIS — N200 Calculus of kidney: Secondary | ICD-10-CM | POA: Diagnosis not present

## 2018-06-27 DIAGNOSIS — N202 Calculus of kidney with calculus of ureter: Secondary | ICD-10-CM

## 2018-07-03 DIAGNOSIS — I517 Cardiomegaly: Secondary | ICD-10-CM | POA: Diagnosis not present

## 2018-07-03 DIAGNOSIS — R0600 Dyspnea, unspecified: Secondary | ICD-10-CM | POA: Diagnosis not present

## 2018-07-03 DIAGNOSIS — R0602 Shortness of breath: Secondary | ICD-10-CM | POA: Diagnosis not present

## 2018-07-03 DIAGNOSIS — N200 Calculus of kidney: Secondary | ICD-10-CM | POA: Diagnosis not present

## 2018-07-04 DIAGNOSIS — J309 Allergic rhinitis, unspecified: Secondary | ICD-10-CM | POA: Diagnosis not present

## 2018-07-04 DIAGNOSIS — R0981 Nasal congestion: Secondary | ICD-10-CM | POA: Diagnosis not present

## 2018-07-10 DIAGNOSIS — J441 Chronic obstructive pulmonary disease with (acute) exacerbation: Secondary | ICD-10-CM | POA: Diagnosis not present

## 2018-07-10 DIAGNOSIS — R062 Wheezing: Secondary | ICD-10-CM | POA: Diagnosis not present

## 2018-07-10 DIAGNOSIS — R5383 Other fatigue: Secondary | ICD-10-CM | POA: Diagnosis not present

## 2018-07-10 DIAGNOSIS — R079 Chest pain, unspecified: Secondary | ICD-10-CM | POA: Diagnosis not present

## 2018-07-18 DIAGNOSIS — J438 Other emphysema: Secondary | ICD-10-CM | POA: Diagnosis not present

## 2018-07-18 DIAGNOSIS — R21 Rash and other nonspecific skin eruption: Secondary | ICD-10-CM | POA: Diagnosis not present

## 2018-07-20 DIAGNOSIS — J449 Chronic obstructive pulmonary disease, unspecified: Secondary | ICD-10-CM | POA: Diagnosis not present

## 2018-07-20 DIAGNOSIS — I10 Essential (primary) hypertension: Secondary | ICD-10-CM | POA: Diagnosis not present

## 2018-07-20 DIAGNOSIS — I2510 Atherosclerotic heart disease of native coronary artery without angina pectoris: Secondary | ICD-10-CM | POA: Diagnosis not present

## 2018-08-02 DIAGNOSIS — R739 Hyperglycemia, unspecified: Secondary | ICD-10-CM | POA: Diagnosis not present

## 2018-08-02 DIAGNOSIS — I10 Essential (primary) hypertension: Secondary | ICD-10-CM | POA: Diagnosis not present

## 2018-08-02 DIAGNOSIS — N200 Calculus of kidney: Secondary | ICD-10-CM | POA: Diagnosis not present

## 2018-08-02 DIAGNOSIS — Z23 Encounter for immunization: Secondary | ICD-10-CM | POA: Diagnosis not present

## 2018-08-10 DIAGNOSIS — J441 Chronic obstructive pulmonary disease with (acute) exacerbation: Secondary | ICD-10-CM | POA: Diagnosis not present

## 2018-08-20 ENCOUNTER — Encounter: Payer: Self-pay | Admitting: Cardiovascular Disease

## 2018-08-20 ENCOUNTER — Ambulatory Visit (INDEPENDENT_AMBULATORY_CARE_PROVIDER_SITE_OTHER): Payer: Medicare Other | Admitting: Cardiovascular Disease

## 2018-08-20 VITALS — BP 108/72 | HR 57 | Ht 69.0 in | Wt 306.0 lb

## 2018-08-20 DIAGNOSIS — E7800 Pure hypercholesterolemia, unspecified: Secondary | ICD-10-CM | POA: Diagnosis not present

## 2018-08-20 DIAGNOSIS — R079 Chest pain, unspecified: Secondary | ICD-10-CM | POA: Diagnosis not present

## 2018-08-20 DIAGNOSIS — I10 Essential (primary) hypertension: Secondary | ICD-10-CM

## 2018-08-20 DIAGNOSIS — I5032 Chronic diastolic (congestive) heart failure: Secondary | ICD-10-CM

## 2018-08-20 MED ORDER — POTASSIUM CHLORIDE CRYS ER 20 MEQ PO TBCR: 20.0000 meq | EXTENDED_RELEASE_TABLET | Freq: Every day | ORAL | 6 refills | Status: DC

## 2018-08-20 NOTE — Progress Notes (Signed)
CARDIOLOGY CONSULT NOTE  Patient ID: LAWAYNE Mathews MRN: [id number] DOB/AGE: 1961-01-08 58 y.o.  Admit date: (Not on file) Primary Physician: Rory Percy, MD Referring Physician: Denny Levy PA-C  Reason for Consultation: Chest pain  HPI: Charles Mathews is a 58 y.o. male who is being seen today for the evaluation of chest pain at the request of Denny Levy PA-C.  I reviewed notes and labs from his PCP.  Labs 08/02/2018: BUN 21, creatinine 1.14, sodium 142, potassium 4.6, normal liver transaminases, hemoglobin A1c 6.2%.  I personally reviewed an ECG performed on 07/10/2018 which demonstrated normal sinus rhythm with no ischemic ST segment or T wave abnormalities, nor any arrhythmias.  He is here with his aid.  He has a history of anxiety.  He was hospitalized at [facility] in November 2019 for chest pain.  Nuclear stress test on 06/20/2018 was normal.  Echocardiogram at that time demonstrated normal left ventricular systolic function, LVEF 00%, with mild mitral and tricuspid regurgitation.  There was mild left atrial enlargement.  I reviewed labs from that hospitalization: LDL 121, total cholesterol 168, triglycerides 152, HDL 26, hemoglobin 13.1, platelets 150, troponin 0 0.214.  He was started on Lasix 40 mg daily.  He is not on supplemental potassium.  He had a right lower lobe mass measuring 3.5 cm which was seen by previous imaging years prior.  He has some occasional chest pains which spontaneously resolved.  He has occasional leg cramps as well.  Shortness of breath is stable.    Allergies  Allergen Reactions  . Bee Venom Anaphylaxis  . Benadryl [Diphenhydramine]     Makes him feel weird, like he is going to pass out  . Lorazepam Other (See Comments)    Makes him feel woozy.  Marland Kitchen Penicillins Other (See Comments)    CHILDHOOD ALLERGY    Current Outpatient Medications  Medication Sig Dispense Refill  . ALBUTEROL IN Inhale  into the lungs.    Marland Kitchen aspirin EC 81 MG tablet Take 81 mg by mouth daily.    . benztropine (COGENTIN) 1 MG tablet Take 1 mg by mouth 2 (two) times daily.     Marland Kitchen doxazosin (CARDURA) 2 MG tablet Take 2 mg by mouth daily.    . furosemide (LASIX) 40 MG tablet Take 40 mg by mouth daily.    . metoprolol tartrate (LOPRESSOR) 50 MG tablet Take 50 mg by mouth 2 (two) times daily.    . paliperidone (INVEGA) 3 MG 24 hr tablet Take 3 mg by mouth every morning.    . pantoprazole (PROTONIX) 40 MG tablet Take 1 tablet (40 mg total) by mouth daily. 90 tablet 3  . sertraline (ZOLOFT) 100 MG tablet Take 200 mg by mouth daily.    . simvastatin (ZOCOR) 40 MG tablet Take 40 mg by mouth daily.    Marland Kitchen tiotropium (SPIRIVA) 18 MCG inhalation capsule Place 18 mcg into inhaler and inhale daily.    Marland Kitchen topiramate (TOPAMAX) 50 MG tablet Take 50 mg by mouth 2 (two) times daily.    . traZODone (DESYREL) 100 MG tablet Take 100 mg by mouth at bedtime.     No current facility-administered medications for this visit.     Past Medical History:  Diagnosis Date  . Anxiety   . Arthritis   . Asthma    Albuterol prn  . Chronic back pain   . Chronic kidney disease    kidney stones  .  Coronary artery disease    11/01/12 he denies known CAD/MI/CHF history  . Diabetes mellitus    borderline  . Gastric ulcer   . GERD (gastroesophageal reflux disease)    takes Prilosec daily  . High cholesterol    takes zocor daily  . Hypertension    takes metoprolol daily and cardura  . Insomnia   . Joint pain   . Lung mass   . Major depression    takes Zoloft and effexor daily  . Migraine    last one couple nights ago and takes Topamax daily  . Morbid obesity ([place])   . Neuropathy   . Seizures ([place])    last one a couple of months ago;takes cogentin daily  . Shortness of breath    with exertion  . Urinary frequency   . Urinary urgency     Past Surgical History:  Procedure Laterality Date  . LITHOTRIPSY  2009  . MULTIPLE  EXTRACTIONS WITH ALVEOLOPLASTY N/A 12/17/2012   Procedure: MULTIPLE EXTRACTION WITH ALVEOLOPLASTY;  Surgeon: Gae Bon, DDS;  Location: [facility];  Service: Oral Surgery;  Laterality: N/A;    Social History   Socioeconomic History  . Marital status: Single    Spouse name: Not on file  . Number of children: Not on file  . Years of education: Not on file  . Highest education level: Not on file  Occupational History  . Not on file  Social Needs  . Financial resource strain: Not on file  . Food insecurity:    Worry: Not on file    Inability: Not on file  . Transportation needs:    Medical: Not on file    Non-medical: Not on file  Tobacco Use  . Smoking status: Former Smoker    Packs/day: 2.00    Years: 27.00    Pack years: 54.00    Types: Cigarettes    Last attempt to quit: 08/01/2002    Years since quitting: 16.0  . Smokeless tobacco: Never Used  Substance and Sexual Activity  . Alcohol use: No  . Drug use: No  . Sexual activity: Never  Lifestyle  . Physical activity:    Days per week: Not on file    Minutes per session: Not on file  . Stress: Not on file  Relationships  . Social connections:    Talks on phone: Not on file    Gets together: Not on file    Attends religious service: Not on file    Active member of club or organization: Not on file    Attends meetings of clubs or organizations: Not on file    Relationship status: Not on file  . Intimate partner violence:    Fear of current or ex partner: Not on file    Emotionally abused: Not on file    Physically abused: Not on file    Forced sexual activity: Not on file  Other Topics Concern  . Not on file  Social History Narrative  . Not on file     No family history of premature CAD in 1st degree relatives.  Current Meds  Medication Sig  . ALBUTEROL IN Inhale into the lungs.  Marland Kitchen aspirin EC 81 MG tablet Take 81 mg by mouth daily.  . benztropine (COGENTIN) 1 MG tablet Take 1 mg by mouth 2 (two) times daily.    Marland Kitchen doxazosin (CARDURA) 2 MG tablet Take 2 mg by mouth daily.  . furosemide (LASIX) 40 MG tablet Take 40  mg by mouth daily.  . metoprolol tartrate (LOPRESSOR) 50 MG tablet Take 50 mg by mouth 2 (two) times daily.  . paliperidone (INVEGA) 3 MG 24 hr tablet Take 3 mg by mouth every morning.  . pantoprazole (PROTONIX) 40 MG tablet Take 1 tablet (40 mg total) by mouth daily.  . sertraline (ZOLOFT) 100 MG tablet Take 200 mg by mouth daily.  . simvastatin (ZOCOR) 40 MG tablet Take 40 mg by mouth daily.  Marland Kitchen tiotropium (SPIRIVA) 18 MCG inhalation capsule Place 18 mcg into inhaler and inhale daily.  Marland Kitchen topiramate (TOPAMAX) 50 MG tablet Take 50 mg by mouth 2 (two) times daily.  . traZODone (DESYREL) 100 MG tablet Take 100 mg by mouth at bedtime.      Review of systems complete and found to be negative unless listed above in HPI    Physical exam Blood pressure 108/72, pulse (!) 57, height 5\' 9"  (1.753 m), weight (!) 306 lb (138.8 kg), SpO2 98 %. General: NAD Neck: No JVD, no thyromegaly or thyroid nodule.  Lungs: Clear to auscultation bilaterally with normal respiratory effort. CV: Nondisplaced PMI. Regular rate and rhythm, normal S1/S2, no S3/S4, no murmur.  No peripheral edema.  No carotid bruit.    Abdomen: Soft, nontender, no distention.  Skin: Intact without lesions or rashes.  Neurologic: Alert and oriented x 3.  Psych: Normal affect. Extremities: No clubbing or cyanosis.  HEENT: Normal.   ECG: Most recent ECG reviewed.   Labs: Lab Results  Component Value Date/Time   K 4.5 12/14/2012 02:25 PM   BUN 16 12/14/2012 02:25 PM   CREATININE 1.04 12/14/2012 02:25 PM   ALT 20 04/02/2011 05:50 AM   TSH 2.092 04/02/2011 05:50 AM   HGB 13.7 12/14/2012 02:25 PM     Lipids: Lab Results  Component Value Date/Time   LDLCALC 78 04/02/2011 05:50 AM   CHOL 135 04/02/2011 05:50 AM   TRIG 84 04/02/2011 05:50 AM   HDL 40 04/02/2011 05:50 AM        ASSESSMENT AND PLAN:   1.  Chronic  diastolic heart failure: Euvolemic.  Continue Lasix 40 mg daily.  Blood pressure is normal.  I will prescribe supplemental potassium chloride 20 mEq daily.  2.  Hypertension: Controlled on Lopressor 50 mg twice daily.  No changes.  3.  Hyperlipidemia: Lipids reviewed above.  Continue simvastatin 40 mg.  4.  Chest pain: Normal nuclear stress test in November 2019.  At this point I will continue beta-blocker and statin therapy.  No further cardiac testing is indicated at this time.  I will monitor symptoms.    Disposition: Follow up in 6 months  Signed: Kate Sable, M.D., F.A.C.C.  08/20/2018, 11:42 AM

## 2018-08-20 NOTE — Patient Instructions (Signed)
Medication Instructions:   Begin Potassium 60meq daily.   Continue all other medications.    Labwork: none  Testing/Procedures: none  Follow-Up: Your physician wants you to follow up in: 6 months.  You will receive a reminder letter in the mail one-two months in advance.  If you don't receive a letter, please call our office to schedule the follow up appointment   Any Other Special Instructions Will Be Listed Below (If Applicable).  If you need a refill on your cardiac medications before your next appointment, please call your pharmacy.

## 2018-09-10 DIAGNOSIS — J441 Chronic obstructive pulmonary disease with (acute) exacerbation: Secondary | ICD-10-CM | POA: Diagnosis not present

## 2018-09-13 ENCOUNTER — Other Ambulatory Visit (HOSPITAL_COMMUNITY): Payer: Self-pay | Admitting: Urology

## 2018-09-13 DIAGNOSIS — N201 Calculus of ureter: Secondary | ICD-10-CM

## 2018-09-14 DIAGNOSIS — J441 Chronic obstructive pulmonary disease with (acute) exacerbation: Secondary | ICD-10-CM | POA: Diagnosis not present

## 2018-09-24 ENCOUNTER — Encounter (HOSPITAL_COMMUNITY): Payer: Self-pay

## 2018-09-24 ENCOUNTER — Ambulatory Visit (HOSPITAL_COMMUNITY): Admission: RE | Admit: 2018-09-24 | Payer: Medicare Other | Source: Ambulatory Visit

## 2018-09-24 ENCOUNTER — Ambulatory Visit (HOSPITAL_COMMUNITY): Payer: Medicare Other

## 2018-09-26 DIAGNOSIS — G4733 Obstructive sleep apnea (adult) (pediatric): Secondary | ICD-10-CM | POA: Diagnosis not present

## 2018-09-26 DIAGNOSIS — R05 Cough: Secondary | ICD-10-CM | POA: Diagnosis not present

## 2018-10-03 ENCOUNTER — Ambulatory Visit: Payer: Medicare Other | Admitting: Urology

## 2018-10-09 DIAGNOSIS — J441 Chronic obstructive pulmonary disease with (acute) exacerbation: Secondary | ICD-10-CM | POA: Diagnosis not present

## 2018-10-11 ENCOUNTER — Ambulatory Visit (HOSPITAL_COMMUNITY)
Admission: RE | Admit: 2018-10-11 | Discharge: 2018-10-11 | Disposition: A | Discharge status: Home / Self Care | Payer: Medicare Other | Source: Ambulatory Visit | Attending: Urology | Admitting: Urology

## 2018-10-11 ENCOUNTER — Other Ambulatory Visit (HOSPITAL_COMMUNITY): Payer: Self-pay | Admitting: Urology

## 2018-10-11 ENCOUNTER — Other Ambulatory Visit: Payer: Self-pay

## 2018-10-11 DIAGNOSIS — N201 Calculus of ureter: Secondary | ICD-10-CM | POA: Insufficient documentation

## 2018-10-11 DIAGNOSIS — R109 Unspecified abdominal pain: Secondary | ICD-10-CM | POA: Diagnosis not present

## 2018-10-11 DIAGNOSIS — N261 Atrophy of kidney (terminal): Secondary | ICD-10-CM | POA: Diagnosis not present

## 2018-10-11 DIAGNOSIS — Z87442 Personal history of urinary calculi: Secondary | ICD-10-CM | POA: Diagnosis not present

## 2018-10-15 DIAGNOSIS — G4733 Obstructive sleep apnea (adult) (pediatric): Secondary | ICD-10-CM | POA: Diagnosis not present

## 2018-10-16 DIAGNOSIS — G4733 Obstructive sleep apnea (adult) (pediatric): Secondary | ICD-10-CM | POA: Diagnosis not present

## 2018-10-29 DIAGNOSIS — E7800 Pure hypercholesterolemia, unspecified: Secondary | ICD-10-CM | POA: Diagnosis not present

## 2018-10-29 DIAGNOSIS — J449 Chronic obstructive pulmonary disease, unspecified: Secondary | ICD-10-CM | POA: Diagnosis not present

## 2018-10-29 DIAGNOSIS — I10 Essential (primary) hypertension: Secondary | ICD-10-CM | POA: Diagnosis not present

## 2018-11-09 DIAGNOSIS — J441 Chronic obstructive pulmonary disease with (acute) exacerbation: Secondary | ICD-10-CM | POA: Diagnosis not present

## 2018-11-26 DIAGNOSIS — G4733 Obstructive sleep apnea (adult) (pediatric): Secondary | ICD-10-CM | POA: Diagnosis not present

## 2018-12-03 DIAGNOSIS — I10 Essential (primary) hypertension: Secondary | ICD-10-CM | POA: Diagnosis not present

## 2018-12-03 DIAGNOSIS — R739 Hyperglycemia, unspecified: Secondary | ICD-10-CM | POA: Diagnosis not present

## 2018-12-03 DIAGNOSIS — R51 Headache: Secondary | ICD-10-CM | POA: Diagnosis not present

## 2018-12-09 DIAGNOSIS — J441 Chronic obstructive pulmonary disease with (acute) exacerbation: Secondary | ICD-10-CM | POA: Diagnosis not present

## 2018-12-26 DIAGNOSIS — G4733 Obstructive sleep apnea (adult) (pediatric): Secondary | ICD-10-CM | POA: Diagnosis not present

## 2019-01-09 DIAGNOSIS — J441 Chronic obstructive pulmonary disease with (acute) exacerbation: Secondary | ICD-10-CM | POA: Diagnosis not present

## 2019-01-26 DIAGNOSIS — G4733 Obstructive sleep apnea (adult) (pediatric): Secondary | ICD-10-CM | POA: Diagnosis not present

## 2019-01-28 DIAGNOSIS — J209 Acute bronchitis, unspecified: Secondary | ICD-10-CM | POA: Diagnosis not present

## 2019-01-28 DIAGNOSIS — J019 Acute sinusitis, unspecified: Secondary | ICD-10-CM | POA: Diagnosis not present

## 2019-01-29 DIAGNOSIS — J441 Chronic obstructive pulmonary disease with (acute) exacerbation: Secondary | ICD-10-CM | POA: Diagnosis not present

## 2019-02-08 DIAGNOSIS — J441 Chronic obstructive pulmonary disease with (acute) exacerbation: Secondary | ICD-10-CM | POA: Diagnosis not present

## 2019-02-18 DIAGNOSIS — J441 Chronic obstructive pulmonary disease with (acute) exacerbation: Secondary | ICD-10-CM | POA: Diagnosis not present

## 2019-02-20 ENCOUNTER — Other Ambulatory Visit: Payer: Self-pay

## 2019-02-20 ENCOUNTER — Other Ambulatory Visit (HOSPITAL_COMMUNITY): Payer: Self-pay | Admitting: Urology

## 2019-02-20 ENCOUNTER — Other Ambulatory Visit: Payer: Self-pay | Admitting: Urology

## 2019-02-20 ENCOUNTER — Other Ambulatory Visit: Payer: Medicare Other

## 2019-02-20 ENCOUNTER — Ambulatory Visit (HOSPITAL_COMMUNITY)
Admission: RE | Admit: 2019-02-20 | Discharge: 2019-02-20 | Disposition: A | Discharge status: Home / Self Care | Payer: Medicare Other | Source: Ambulatory Visit | Attending: Urology | Admitting: Urology

## 2019-02-20 ENCOUNTER — Ambulatory Visit (INDEPENDENT_AMBULATORY_CARE_PROVIDER_SITE_OTHER): Payer: Medicare Other | Admitting: Urology

## 2019-02-20 DIAGNOSIS — R6889 Other general symptoms and signs: Secondary | ICD-10-CM | POA: Diagnosis not present

## 2019-02-20 DIAGNOSIS — N201 Calculus of ureter: Secondary | ICD-10-CM

## 2019-02-20 DIAGNOSIS — Z20822 Contact with and (suspected) exposure to covid-19: Secondary | ICD-10-CM

## 2019-02-20 DIAGNOSIS — Z87442 Personal history of urinary calculi: Secondary | ICD-10-CM | POA: Diagnosis not present

## 2019-02-21 ENCOUNTER — Other Ambulatory Visit: Payer: Self-pay | Admitting: Cardiovascular Disease

## 2019-02-24 LAB — NOVEL CORONAVIRUS, NAA: SARS-CoV-2, NAA: NOT DETECTED

## 2019-02-25 DIAGNOSIS — G4733 Obstructive sleep apnea (adult) (pediatric): Secondary | ICD-10-CM | POA: Diagnosis not present

## 2019-02-27 DIAGNOSIS — J449 Chronic obstructive pulmonary disease, unspecified: Secondary | ICD-10-CM | POA: Diagnosis not present

## 2019-02-27 DIAGNOSIS — M25562 Pain in left knee: Secondary | ICD-10-CM | POA: Diagnosis not present

## 2019-02-27 DIAGNOSIS — R2242 Localized swelling, mass and lump, left lower limb: Secondary | ICD-10-CM | POA: Diagnosis not present

## 2019-02-27 DIAGNOSIS — H9190 Unspecified hearing loss, unspecified ear: Secondary | ICD-10-CM | POA: Diagnosis not present

## 2019-02-27 DIAGNOSIS — M25462 Effusion, left knee: Secondary | ICD-10-CM | POA: Diagnosis not present

## 2019-02-27 DIAGNOSIS — Z9181 History of falling: Secondary | ICD-10-CM | POA: Diagnosis not present

## 2019-02-27 DIAGNOSIS — J309 Allergic rhinitis, unspecified: Secondary | ICD-10-CM | POA: Diagnosis not present

## 2019-03-01 DIAGNOSIS — I10 Essential (primary) hypertension: Secondary | ICD-10-CM | POA: Diagnosis not present

## 2019-03-01 DIAGNOSIS — J449 Chronic obstructive pulmonary disease, unspecified: Secondary | ICD-10-CM | POA: Diagnosis not present

## 2019-03-11 DIAGNOSIS — J441 Chronic obstructive pulmonary disease with (acute) exacerbation: Secondary | ICD-10-CM | POA: Diagnosis not present

## 2019-03-28 DIAGNOSIS — G4733 Obstructive sleep apnea (adult) (pediatric): Secondary | ICD-10-CM | POA: Diagnosis not present

## 2019-04-01 DIAGNOSIS — I10 Essential (primary) hypertension: Secondary | ICD-10-CM | POA: Diagnosis not present

## 2019-04-01 DIAGNOSIS — J449 Chronic obstructive pulmonary disease, unspecified: Secondary | ICD-10-CM | POA: Diagnosis not present

## 2019-04-05 DIAGNOSIS — R0602 Shortness of breath: Secondary | ICD-10-CM | POA: Diagnosis not present

## 2019-04-05 DIAGNOSIS — J449 Chronic obstructive pulmonary disease, unspecified: Secondary | ICD-10-CM | POA: Diagnosis not present

## 2019-04-05 DIAGNOSIS — Z23 Encounter for immunization: Secondary | ICD-10-CM | POA: Diagnosis not present

## 2019-04-05 DIAGNOSIS — R739 Hyperglycemia, unspecified: Secondary | ICD-10-CM | POA: Diagnosis not present

## 2019-04-11 DIAGNOSIS — J441 Chronic obstructive pulmonary disease with (acute) exacerbation: Secondary | ICD-10-CM | POA: Diagnosis not present

## 2019-04-22 DIAGNOSIS — J441 Chronic obstructive pulmonary disease with (acute) exacerbation: Secondary | ICD-10-CM | POA: Diagnosis not present

## 2019-04-28 DIAGNOSIS — G4733 Obstructive sleep apnea (adult) (pediatric): Secondary | ICD-10-CM | POA: Diagnosis not present

## 2019-05-01 DIAGNOSIS — I10 Essential (primary) hypertension: Secondary | ICD-10-CM | POA: Diagnosis not present

## 2019-05-05 DIAGNOSIS — Z91013 Allergy to seafood: Secondary | ICD-10-CM | POA: Diagnosis not present

## 2019-05-05 DIAGNOSIS — I6782 Cerebral ischemia: Secondary | ICD-10-CM | POA: Diagnosis not present

## 2019-05-05 DIAGNOSIS — Z888 Allergy status to other drugs, medicaments and biological substances status: Secondary | ICD-10-CM | POA: Diagnosis not present

## 2019-05-05 DIAGNOSIS — M542 Cervicalgia: Secondary | ICD-10-CM | POA: Diagnosis not present

## 2019-05-05 DIAGNOSIS — R112 Nausea with vomiting, unspecified: Secondary | ICD-10-CM | POA: Diagnosis not present

## 2019-05-05 DIAGNOSIS — I10 Essential (primary) hypertension: Secondary | ICD-10-CM | POA: Diagnosis not present

## 2019-05-05 DIAGNOSIS — Z880 Allergy status to penicillin: Secondary | ICD-10-CM | POA: Diagnosis not present

## 2019-05-05 DIAGNOSIS — E119 Type 2 diabetes mellitus without complications: Secondary | ICD-10-CM | POA: Diagnosis not present

## 2019-05-05 DIAGNOSIS — Z87891 Personal history of nicotine dependence: Secondary | ICD-10-CM | POA: Diagnosis not present

## 2019-05-05 DIAGNOSIS — J449 Chronic obstructive pulmonary disease, unspecified: Secondary | ICD-10-CM | POA: Diagnosis not present

## 2019-05-05 DIAGNOSIS — R519 Headache, unspecified: Secondary | ICD-10-CM | POA: Diagnosis not present

## 2019-05-05 DIAGNOSIS — Z91012 Allergy to eggs: Secondary | ICD-10-CM | POA: Diagnosis not present

## 2019-05-10 DIAGNOSIS — R739 Hyperglycemia, unspecified: Secondary | ICD-10-CM | POA: Diagnosis not present

## 2019-05-10 DIAGNOSIS — Z0001 Encounter for general adult medical examination with abnormal findings: Secondary | ICD-10-CM | POA: Diagnosis not present

## 2019-05-10 DIAGNOSIS — I10 Essential (primary) hypertension: Secondary | ICD-10-CM | POA: Diagnosis not present

## 2019-05-11 DIAGNOSIS — J441 Chronic obstructive pulmonary disease with (acute) exacerbation: Secondary | ICD-10-CM | POA: Diagnosis not present

## 2019-05-28 DIAGNOSIS — G4733 Obstructive sleep apnea (adult) (pediatric): Secondary | ICD-10-CM | POA: Diagnosis not present

## 2019-05-31 DIAGNOSIS — I10 Essential (primary) hypertension: Secondary | ICD-10-CM | POA: Diagnosis not present

## 2019-05-31 DIAGNOSIS — E7800 Pure hypercholesterolemia, unspecified: Secondary | ICD-10-CM | POA: Diagnosis not present

## 2019-05-31 DIAGNOSIS — J449 Chronic obstructive pulmonary disease, unspecified: Secondary | ICD-10-CM | POA: Diagnosis not present

## 2019-06-03 ENCOUNTER — Other Ambulatory Visit: Payer: Self-pay | Admitting: Cardiovascular Disease

## 2019-06-13 ENCOUNTER — Other Ambulatory Visit: Payer: Self-pay | Admitting: Cardiovascular Disease

## 2019-06-28 DIAGNOSIS — G4733 Obstructive sleep apnea (adult) (pediatric): Secondary | ICD-10-CM | POA: Diagnosis not present

## 2019-07-01 ENCOUNTER — Telehealth: Payer: Self-pay | Admitting: Physician Assistant

## 2019-07-01 DIAGNOSIS — J449 Chronic obstructive pulmonary disease, unspecified: Secondary | ICD-10-CM | POA: Diagnosis not present

## 2019-07-01 DIAGNOSIS — I2510 Atherosclerotic heart disease of native coronary artery without angina pectoris: Secondary | ICD-10-CM | POA: Diagnosis not present

## 2019-07-01 NOTE — Telephone Encounter (Signed)

## 2019-07-02 DIAGNOSIS — Z87898 Personal history of other specified conditions: Secondary | ICD-10-CM | POA: Insufficient documentation

## 2019-07-02 NOTE — Progress Notes (Signed)
Virtual Visit via Telephone Note   This visit type was conducted due to national recommendations for restrictions regarding the COVID-19 Pandemic (e.g. social distancing) in an effort to limit this patient's exposure and mitigate transmission in our community.  Due to his co-morbid illnesses, this patient is at least at moderate risk for complications without adequate follow up.  This format is felt to be most appropriate for this patient at this time.  The patient did not have access to video technology/had technical difficulties with video requiring transitioning to audio format only (telephone).  All issues noted in this document were discussed and addressed.  No physical exam could be performed with this format.  Please refer to the patient's chart for his  consent to telehealth for [facility].   Date:  07/03/2019   ID:  Charles Mathews, DOB 24-Jan-1961, MRN [id number]  Patient Location: Home Provider Location: Home  PCP:  Patient, No Pcp Per  Cardiologist:  Kate Sable, MD   Electrophysiologist:  None   Evaluation Performed:  Follow-Up Visit  Chief Complaint:  Follow up  History of Present Illness:    Charles BLOXSOM is a 58 y.o. male with history of chest pain with normal NST 06/2018 at [facility].  Echo at that time normal LV function EF 55% with mild MR and TR and mild left atrial enlargement.  Also has chronic diastolic CHF, hypertension, hyperlipidemia, anxiety.  Patient saw Dr. Bronson Ing 08/2018 with chest pain not felt to be typical.  No further testing indicated at that time.  Patient complains of palpitations starting a couple weeks ago. Occurs when he's doing something. Just skips a few beats and gets dizzy and short of breath with it. Thinks it's because he's overweight. He says he's had it for a long time and is used to it. Lasts about 15-20 min. Like a "shock". Patient says he has swelling at times but his aide says he never has swelling.  No regular exercise. BP up this am but hasn't taken his meds yet. Eats out a lot. Drinks sweet tea. Was drinking 2-3 sodas/day. Stopped over the weekend because of kidney stones.   The patient does not have symptoms concerning for COVID-19 infection (fever, chills, cough, or new shortness of breath).    Past Medical History:  Diagnosis Date  . Anxiety   . Arthritis   . Asthma    Albuterol prn  . Chronic back pain   . Chronic kidney disease    kidney stones  . Coronary artery disease    11/01/12 he denies known CAD/MI/CHF history  . Diabetes mellitus    borderline  . Gastric ulcer   . GERD (gastroesophageal reflux disease)    takes Prilosec daily  . High cholesterol    takes zocor daily  . Hypertension    takes metoprolol daily and cardura  . Insomnia   . Joint pain   . Lung mass   . Major depression    takes Zoloft and effexor daily  . Migraine    last one couple nights ago and takes Topamax daily  . Morbid obesity ([place])   . Neuropathy   . Seizures ([place])    last one a couple of months ago;takes cogentin daily  . Shortness of breath    with exertion  . Urinary frequency   . Urinary urgency    Past Surgical History:  Procedure Laterality Date  . LITHOTRIPSY  2009  . MULTIPLE EXTRACTIONS WITH ALVEOLOPLASTY  N/A 12/17/2012   Procedure: MULTIPLE EXTRACTION WITH ALVEOLOPLASTY;  Surgeon: Gae Bon, DDS;  Location: [place];  Service: Oral Surgery;  Laterality: N/A;     Current Meds  Medication Sig  . ALBUTEROL IN Inhale into the lungs.  Marland Kitchen aspirin EC 81 MG tablet Take 81 mg by mouth daily.  . benztropine (COGENTIN) 1 MG tablet Take 1 mg by mouth 2 (two) times daily.   Marland Kitchen doxazosin (CARDURA) 2 MG tablet Take 1 tablet (2 mg total) by mouth daily.  . furosemide (LASIX) 40 MG tablet Take 1 tablet (40 mg total) by mouth daily.  . metoprolol tartrate (LOPRESSOR) 50 MG tablet Take 1 tablet (50 mg total) by mouth 2 (two) times daily.  . paliperidone (INVEGA) 3 MG 24 hr tablet  Take 3 mg by mouth every morning.  . pantoprazole (PROTONIX) 40 MG tablet Take 1 tablet (40 mg total) by mouth daily.  . potassium chloride SA (KLOR-CON) 20 MEQ tablet Take 1 tablet (20 mEq total) by mouth daily.  . sertraline (ZOLOFT) 100 MG tablet Take 200 mg by mouth daily.  . simvastatin (ZOCOR) 40 MG tablet Take 1 tablet (40 mg total) by mouth daily.  Marland Kitchen tiotropium (SPIRIVA) 18 MCG inhalation capsule Place 18 mcg into inhaler and inhale daily.  Marland Kitchen topiramate (TOPAMAX) 50 MG tablet Take 50 mg by mouth 2 (two) times daily.  . traZODone (DESYREL) 100 MG tablet Take 100 mg by mouth at bedtime.  . [DISCONTINUED] doxazosin (CARDURA) 2 MG tablet Take 2 mg by mouth daily.  . [DISCONTINUED] furosemide (LASIX) 40 MG tablet Take 40 mg by mouth daily.  . [DISCONTINUED] metoprolol tartrate (LOPRESSOR) 50 MG tablet Take 50 mg by mouth 2 (two) times daily.  . [DISCONTINUED] potassium chloride SA (KLOR-CON) 20 MEQ tablet TAKE ONE TABLET BY MOUTH DAILY  . [DISCONTINUED] simvastatin (ZOCOR) 40 MG tablet Take 40 mg by mouth daily.     Allergies:   Bee venom, Benadryl [diphenhydramine], Lorazepam, and Penicillins   Social History   Tobacco Use  . Smoking status: Former Smoker    Packs/day: 2.00    Years: 27.00    Pack years: 54.00    Types: Cigarettes    Quit date: 08/01/2002    Years since quitting: 16.9  . Smokeless tobacco: Never Used  Substance Use Topics  . Alcohol use: No  . Drug use: No     Family Hx: The patient's family history is not on file. He was adopted.  ROS:   Please see the history of present illness.     All other systems reviewed and are negative.   Prior CV studies:   The following studies were reviewed today:  NST 06/2018 [facility] CONCLUSION:   1.) LIMITATIONS: None. 2.) MYOCARDIAL PERFUSION: No definite evidence for a transmural scar or significant inducible transmural ischemia  3.) LEFT VENTRICULAR EJECTION FRACTION: Normal. 4.) REGIONAL WALL MOTION:  Normal. 5.) Incidental note is made of right lower lobe pulmonary mass subpleurally measuring up to 3.5 cm. Recommended contrast enhanced CT chest for further evaluation  2D echo 11/2019FINDINGS:  LEFT VENTRICLE There is normal left ventricular wall thickness. The left ventricular size is  normal. Left ventricular systolic function is normal. LV ejection fraction =  55%. Left ventricular filling pattern is normal. The left ventricular wall  motion is normal. -  RIGHT VENTRICLE The right ventricle is normal in size and function.  LEFT ATRIUM The left atrium is mildly dilated.  RIGHT ATRIUM  Right  atrial size is normal. - AORTIC VALVE Diffuse thickening of the aortic valve with preserved cusp opening. There is  no aortic stenosis. There is no aortic regurgitation. - MITRAL VALVE There is mild mitral annular calcification. There is mild mitral  regurgitation. - TRICUSPID VALVE There is mild tricuspid valve thickening. There is mild tricuspid  regurgitation. No pulmonary hypertension. - PULMONIC VALVE The pulmonic valve is not well visualized. There is no pulmonic valvular  regurgitation. There is no pulmonic valvular stenosis. - ARTERIES The aortic root is normal size. - VENOUS Pulmonary venous flow pattern is blunted. IVC size was mildly dilated. - EFFUSION There is no pericardial effusion. - - MMode/2D Measurements & Calculations IVSd: 1.1 cm LVIDd: 5.6 cm LVPWd: 1.1 cm LVIDs: 4.7 cm LA diam: 4.0 cm EDV(MOD-sp4): 159.0 ml ESV(MOD-sp4): 79.1 ml EDV(MOD-sp2): 150.6 ml ESV(MOD-sp2): 65.6 ml Ao sinus diam: 2.9 cm asc Aorta Diam: 3.0 cm LVOT diam: 2.0 cm SV(MOD-sp4): 79.9 ml SI(MOD-sp4): 32.0 ml/m2 IVC 1: 2.0 cm LA area A2: 23.8 cm2 LA area A4: 24.6 cm2 LA vol: 77.9 ml LA vol index: 31.2 ml/m2 RA area A4: 23.3 cm2 Doppler Measurements & Calculations MV E max vel: 135.7 cm/sec MV A max vel: 101.8 cm/sec MV E/A: 1.3 Med Peak E' Vel: 7.3 cm/sec Lat  Peak E' Vel: 13.1 cm/sec E/Lat E`: 10.4 E/Med E`: 18.6 MV dec time: 0.18 sec SV(LVOT): 106.7 ml Ao V2 max: 220.0 cm/sec Ao max PG: 19.4 mmHg Ao V2 mean: 135.5 cm/sec Ao mean PG: 8.5 mmHg Ao V2 VTI: 46.1 cm AVA (VTI): 2.3 cm2 LV V1 VTI: 33.7 cm TR max vel: 188.8 cm/sec TR max PG: 14.3 mmHg RVSP(TR): 24.3 mmHg RAP systole: 10.0 mmHg AS Dimensionless Index (VTI): 0.73 AVAi(VTI) cm^2/m^2: 0.93 cm2 SV index(LVOT): 42.8 ml/m2   Reading Physician: [place], [id number] 06/18/2018 02:42 PM  Labs/Other Tests and Data Reviewed:    EKG:  No ECG reviewed.  Recent Labs: No results found for requested labs within last 8760 hours.   Recent Lipid Panel Lab Results  Component Value Date/Time   CHOL 135 04/02/2011 05:50 AM   TRIG 84 04/02/2011 05:50 AM   HDL 40 04/02/2011 05:50 AM   CHOLHDL 3.4 04/02/2011 05:50 AM   LDLCALC 78 04/02/2011 05:50 AM    Wt Readings from Last 3 Encounters:  07/03/19 (!) 330 lb (149.7 kg)  08/20/18 (!) 306 lb (138.8 kg)  01/15/18 (!) 320 lb 8 oz (145.4 kg)     Objective:    Vital Signs:  BP (!) 137/94   Pulse 75   Ht 5\' 9"  (1.753 m)   Wt (!) 330 lb (149.7 kg)   BMI 48.73 kg/m    VITAL SIGNS:  reviewed  ASSESSMENT & PLAN:    1. History of chest pain with normal NST 06/2018 [facility] 2. Palpitations-has had for a long time but seems to have worsened recently with increase weight gain/sodas/caffeine. To have blood work tomorrow by PCP. 3. Chronic diastolic CHF on Lasix and potassium 4. Essential hypertension on metoprolol-high today but hasn't taken his meds yet. Will take now and check BP daily for 2 weeks. 2 gm sodium diet. 5. Hyperlipidemia on simvastatin-was out of it since January. Just restarted yesterday. Followed by PCP. 6. Obesity-gained 25 lbs since the first of the year.will refer to weight loss center in [place].  COVID-19 Education: The signs and symptoms of COVID-19 were discussed with the patient and how to seek care  for testing (follow up with PCP  or arrange E-visit).   The importance of social distancing was discussed today.  Time:   Today, I have spent  18: minutes with the patient with telehealth technology discussing the above problems.     Medication Adjustments/Labs and Tests Ordered: Current medicines are reviewed at length with the patient today.  Concerns regarding medicines are outlined above.   Tests Ordered: Orders Placed This Encounter  Procedures  . Referral to Nutrition and Diabetes Services    Medication Changes: Meds ordered this encounter  Medications  . doxazosin (CARDURA) 2 MG tablet    Sig: Take 1 tablet (2 mg total) by mouth daily.    Dispense:  30 tablet    Refill:  6  . furosemide (LASIX) 40 MG tablet    Sig: Take 1 tablet (40 mg total) by mouth daily.    Dispense:  30 tablet    Refill:  6  . metoprolol tartrate (LOPRESSOR) 50 MG tablet    Sig: Take 1 tablet (50 mg total) by mouth 2 (two) times daily.    Dispense:  60 tablet    Refill:  6  . potassium chloride SA (KLOR-CON) 20 MEQ tablet    Sig: Take 1 tablet (20 mEq total) by mouth daily.    Dispense:  30 tablet    Refill:  6    PT NEEDS APPT  . simvastatin (ZOCOR) 40 MG tablet    Sig: Take 1 tablet (40 mg total) by mouth daily.    Dispense:  30 tablet    Refill:  6    Follow Up:  Either In Person or Virtual in 6 month(s) Dr. Bronson Ing  Signed, Ermalinda Barrios, PA-C  07/03/2019 9:29 AM    [place]

## 2019-07-03 ENCOUNTER — Telehealth (INDEPENDENT_AMBULATORY_CARE_PROVIDER_SITE_OTHER): Payer: Medicare Other | Admitting: Physician Assistant

## 2019-07-03 ENCOUNTER — Encounter: Payer: Self-pay | Admitting: Physician Assistant

## 2019-07-03 VITALS — BP 137/94 | HR 75 | Ht 69.0 in | Wt 330.0 lb

## 2019-07-03 DIAGNOSIS — I10 Essential (primary) hypertension: Secondary | ICD-10-CM | POA: Diagnosis not present

## 2019-07-03 DIAGNOSIS — I5032 Chronic diastolic (congestive) heart failure: Secondary | ICD-10-CM | POA: Diagnosis not present

## 2019-07-03 DIAGNOSIS — Z87898 Personal history of other specified conditions: Secondary | ICD-10-CM

## 2019-07-03 MED ORDER — SIMVASTATIN 40 MG PO TABS: 40.0000 mg | ORAL_TABLET | Freq: Every day | ORAL | 6 refills | Status: DC

## 2019-07-03 MED ORDER — DOXAZOSIN MESYLATE 2 MG PO TABS: 2.0000 mg | ORAL_TABLET | Freq: Every day | ORAL | 6 refills | Status: DC

## 2019-07-03 MED ORDER — FUROSEMIDE 40 MG PO TABS: 40.0000 mg | ORAL_TABLET | Freq: Every day | ORAL | 6 refills | Status: DC

## 2019-07-03 MED ORDER — POTASSIUM CHLORIDE CRYS ER 20 MEQ PO TBCR: 20.0000 meq | EXTENDED_RELEASE_TABLET | Freq: Every day | ORAL | 6 refills | Status: DC

## 2019-07-03 MED ORDER — METOPROLOL TARTRATE 50 MG PO TABS: 50.0000 mg | ORAL_TABLET | Freq: Two times a day (BID) | ORAL | 6 refills | Status: DC

## 2019-07-03 NOTE — Patient Instructions (Addendum)
Medication Instructions:  Your physician recommends that you continue on your current medications as directed. Please refer to the Current Medication list given to you today.  *If you need a refill on your cardiac medications before your next appointment, please call your pharmacy*  Lab Work: None today If you have labs (blood work) drawn today and your tests are completely normal, you will receive your results only by: Marland Kitchen MyChart Message (if you have MyChart) OR . A paper copy in the mail If you have any lab test that is abnormal or we need to change your treatment, we will call you to review the results.  Testing/Procedures: None today  Follow-Up: At [facility], you and your health needs are our priority.  As part of our continuing mission to provide you with exceptional heart care, we have created designated Provider Care Teams.  These Care Teams include your primary Cardiologist (physician) and Advanced Practice Providers (APPs -  Physician Assistants and Nurse Practitioners) who all work together to provide you with the care you need, when you need it.  Your next appointment:   6 month(s)  The format for your next appointment:   In Person  Provider:   Kate Sable, MD  Other Instructions Follow 2 gram sodium guide provided for you   Exercise 150 minutes a week   Check BP, call if elevated   Reduce caffeine and see if palpitations decrease, call if they don't   We have referred you to weight loss center with Hilda Lias. They will call you to schedule an apt.      Thank you for choosing [facility] !       Two Gram Sodium Diet 2000 mg  What is Sodium? Sodium is a mineral found naturally in many foods. The most significant source of sodium in the diet is table salt, which is about 40% sodium.  Processed, convenience, and preserved foods also contain a large amount of sodium.  The body needs only 500 mg of sodium daily  to function,  A normal diet provides more than enough sodium even if you do not use salt.  Why Limit Sodium? A build up of sodium in the body can cause thirst, increased blood pressure, shortness of breath, and water retention.  Decreasing sodium in the diet can reduce edema and risk of heart attack or stroke associated with high blood pressure.  Keep in mind that there are many other factors involved in these health problems.  Heredity, obesity, lack of exercise, cigarette smoking, stress and what you eat all play a role.  General Guidelines:  Do not add salt at the table or in cooking.  One teaspoon of salt contains over 2 grams of sodium.  Read food labels  Avoid processed and convenience foods  Ask your dietitian before eating any foods not dicussed in the menu planning guidelines  Consult your physician if you wish to use a salt substitute or a sodium containing medication such as antacids.  Limit milk and milk products to 16 oz (2 cups) per day.  Shopping Hints:  READ LABELS!! "Dietetic" does not necessarily mean low sodium.  Salt and other sodium ingredients are often added to foods during processing.   Menu Planning Guidelines Food Group Choose More Often Avoid  Beverages (see also the milk group All fruit juices, low-sodium, salt-free vegetables juices, low-sodium carbonated beverages Regular vegetable or tomato juices, commercially softened water used for drinking or cooking  Breads and Cereals Enriched  white, wheat, rye and pumpernickel bread, hard rolls and dinner rolls; muffins, cornbread and waffles; most dry cereals, cooked cereal without added salt; unsalted crackers and breadsticks; low sodium or homemade bread crumbs Bread, rolls and crackers with salted tops; quick breads; instant hot cereals; pancakes; commercial bread stuffing; self-rising flower and biscuit mixes; regular bread crumbs or cracker crumbs  Desserts and Sweets Desserts and sweets mad with mild should be  within allowance Instant pudding mixes and cake mixes  Fats Butter or margarine; vegetable oils; unsalted salad dressings, regular salad dressings limited to 1 Tbs; light, sour and heavy cream Regular salad dressings containing bacon fat, bacon bits, and salt pork; snack dips made with instant soup mixes or processed cheese; salted nuts  Fruits Most fresh, frozen and canned fruits Fruits processed with salt or sodium-containing ingredient (some dried fruits are processed with sodium sulfites        Vegetables Fresh, frozen vegetables and low- sodium canned vegetables Regular canned vegetables, sauerkraut, pickled vegetables, and others prepared in brine; frozen vegetables in sauces; vegetables seasoned with ham, bacon or salt pork  Condiments, Sauces, Miscellaneous  Salt substitute with physician's approval; pepper, herbs, spices; vinegar, lemon or lime juice; hot pepper sauce; garlic powder, onion powder, low sodium soy sauce (1 Tbs.); low sodium condiments (ketchup, chili sauce, mustard) in limited amounts (1 tsp.) fresh ground horseradish; unsalted tortilla chips, pretzels, potato chips, popcorn, salsa (1/4 cup) Any seasoning made with salt including garlic salt, celery salt, onion salt, and seasoned salt; sea salt, rock salt, kosher salt; meat tenderizers; monosodium glutamate; mustard, regular soy sauce, barbecue, sauce, chili sauce, teriyaki sauce, steak sauce, Worcestershire sauce, and most flavored vinegars; canned gravy and mixes; regular condiments; salted snack foods, olives, picles, relish, horseradish sauce, catsup   Food preparation: Try these seasonings Meats:    Pork Sage, onion Serve with applesauce  Chicken Poultry seasoning, thyme, parsley Serve with cranberry sauce  Lamb Curry powder, rosemary, garlic, thyme Serve with mint sauce or jelly  Veal Marjoram, basil Serve with current jelly, cranberry sauce  Beef Pepper, bay leaf Serve with dry mustard, unsalted chive butter  Fish  Bay leaf, dill Serve with unsalted lemon butter, unsalted parsley butter  Vegetables:    Asparagus Lemon juice   Broccoli Lemon juice   Carrots Mustard dressing parsley, mint, nutmeg, glazed with unsalted butter and sugar   Green beans Marjoram, lemon juice, nutmeg,dill seed   Tomatoes Basil, marjoram, onion   Spice /blend for [employer]" 4 tsp ground thyme 1 tsp ground sage 3 tsp ground rosemary 4 tsp ground marjoram   Test your knowledge 1. A product that says "Salt Free" may still contain sodium. True or False 2. Garlic Powder and Hot Pepper Sauce an be used as alternative seasonings.True or False 3. Processed foods have more sodium than fresh foods.  True or False 4. Canned Vegetables have less sodium than froze True or False  WAYS TO DECREASE YOUR SODIUM INTAKE 1. Avoid the use of added salt in cooking and at the table.  Table salt (and other prepared seasonings which contain salt) is probably one of the greatest sources of sodium in the diet.  Unsalted foods can gain flavor from the sweet, sour, and butter taste sensations of herbs and spices.  Instead of using salt for seasoning, try the following seasonings with the foods listed.  Remember: how you use them to enhance natural food flavors is limited only by your creativity... Allspice-Meat, fish, eggs, fruit, peas, red and  yellow vegetables Almond Extract-Fruit baked goods Anise Seed-Sweet breads, fruit, carrots, beets, cottage cheese, cookies (tastes like licorice) Basil-Meat, fish, eggs, vegetables, rice, vegetables salads, soups, sauces Bay Leaf-Meat, fish, stews, poultry Burnet-Salad, vegetables (cucumber-like flavor) Caraway Seed-Bread, cookies, cottage cheese, meat, vegetables, cheese, rice Cardamon-Baked goods, fruit, soups Celery Powder or seed-Salads, salad dressings, sauces, meatloaf, soup, bread.Do not use  celery salt Chervil-Meats, salads, fish, eggs, vegetables, cottage cheese (parsley-like flavor) Chili  Power-Meatloaf, chicken cheese, corn, eggplant, egg dishes Chives-Salads cottage cheese, egg dishes, soups, vegetables, sauces Cilantro-Salsa, casseroles Cinnamon-Baked goods, fruit, pork, lamb, chicken, carrots Cloves-Fruit, baked goods, fish, pot roast, green beans, beets, carrots Coriander-Pastry, cookies, meat, salads, cheese (lemon-orange flavor) Cumin-Meatloaf, fish,cheese, eggs, cabbage,fruit pie (caraway flavor) [employer], fruit, eggs, fish, poultry, cottage cheese, vegetables Dill Seed-Meat, cottage cheese, poultry, vegetables, fish, salads, bread Fennel Seed-Bread, cookies, apples, pork, eggs, fish, beets, cabbage, cheese, Licorice-like flavor Garlic-(buds or powder) Salads, meat, poultry, fish, bread, butter, vegetables, potatoes.Do not  use garlic salt Ginger-Fruit, vegetables, baked goods, meat, fish, poultry Horseradish Root-Meet, vegetables, butter Lemon Juice or Extract-Vegetables, fruit, tea, baked goods, fish salads Mace-Baked goods fruit, vegetables, fish, poultry (taste like nutmeg) Maple Extract-Syrups Marjoram-Meat, chicken, fish, vegetables, breads, green salads (taste like Sage) Mint-Tea, lamb, sherbet, vegetables, desserts, carrots, cabbage Mustard, Dry or Seed-Cheese, eggs, meats, vegetables, poultry Nutmeg-Baked goods, fruit, chicken, eggs, vegetables, desserts Onion Powder-Meat, fish, poultry, vegetables, cheese, eggs, bread, rice salads (Do not use   Onion salt) Orange Extract-Desserts, baked goods Oregano-Pasta, eggs, cheese, onions, pork, lamb, fish, chicken, vegetables, green salads Paprika-Meat, fish, poultry, eggs, cheese, vegetables Parsley Flakes-Butter, vegetables, meat fish, poultry, eggs, bread, salads (certain forms may   Contain sodium Pepper-Meat fish, poultry, vegetables, eggs Peppermint Extract-Desserts, baked goods Poppy Seed-Eggs, bread, cheese, fruit dressings, baked goods, noodles, vegetables, cottage  [employer], poultry, meat, fish, cauliflower, turnips,eggs bread Saffron-Rice, bread, veal, chicken, fish, eggs Sage-Meat, fish, poultry, onions, eggplant, tomateos, pork, stews Savory-Eggs, salads, poultry, meat, rice, vegetables, soups, pork Tarragon-Meat, poultry, fish, eggs, butter, vegetables (licorice-like flavor)  Thyme-Meat, poultry, fish, eggs, vegetables, (clover-like flavor), sauces, soups Tumeric-Salads, butter, eggs, fish, rice, vegetables (saffron-like flavor) Vanilla Extract-Baked goods, candy Vinegar-Salads, vegetables, meat marinades Walnut Extract-baked goods, candy  2. Choose your Foods Wisely   The following is a list of foods to avoid which are high in sodium:  Meats-Avoid all smoked, canned, salt cured, dried and kosher meat and fish as well as Anchovies   [employer] meats:Bologna, Liverwurst, Pastrami Canned meat or fish  Marinated herring Caviar    Pepperoni Corned Beef   Pizza Dried chipped beef  Salami Frozen breaded fish or meat Salt pork Frankfurters or hot dogs  Sardines Gefilte fish   Sausage Ham (boiled ham, Proscuitto Smoked butt    spiced ham)   Spam      TV Dinners Vegetables Canned vegetables (Regular) Relish Canned mushrooms  Sauerkraut Olives    Tomato juice Pickles  Bakery and Dessert Products Canned puddings  Cream pies Cheesecake   Decorated cakes Cookies  Beverages/Juices Tomato juice, regular  Gatorade   V-8 vegetable juice, regular  Breads and Cereals Biscuit mixes   Salted potato chips, corn chips, pretzels Bread stuffing mixes  Salted crackers and rolls Pancake and waffle mixes Self-rising flour  Seasonings Accent    Meat sauces Barbecue sauce  Meat tenderizer Catsup    Monosodium glutamate (MSG) Celery salt   Onion salt Chili sauce   Prepared mustard Garlic salt   Salt,  seasoned salt, sea salt Gravy mixes   Soy sauce Horseradish   Steak sauce Ketchup   Tartar sauce Lite  salt    Teriyaki sauce Marinade mixes   Worcestershire sauce  Others Baking powder   Cocoa and cocoa mixes Baking soda   Commercial casserole mixes Candy-caramels, chocolate  Dehydrated soups    Bars, fudge,nougats  Instant rice and pasta mixes Canned broth or soup  Maraschino cherries Cheese, aged and processed cheese and cheese spreads  Learning Assessment Quiz  Indicated T (for True) or F (for False) for each of the following statements:  1. _____ Fresh fruits and vegetables and unprocessed grains are generally low in sodium 2. _____ Water may contain a considerable amount of sodium, depending on the source 3. _____ You can always tell if a food is high in sodium by tasting it 4. _____ Certain laxatives my be high in sodium and should be avoided unless prescribed   by a physician or pharmacist 5. _____ Salt substitutes may be used freely by anyone on a sodium restricted diet 6. _____ Sodium is present in table salt, food additives and as a natural component of   most foods 7. _____ Table salt is approximately 90% sodium 8. _____ Limiting sodium intake may help prevent excess fluid accumulation in the body 9. _____ On a sodium-restricted diet, seasonings such as bouillon soy sauce, and    cooking wine should be used in place of table salt 10. _____ On an ingredient list, a product which lists monosodium glutamate as the first   ingredient is an appropriate food to include on a low sodium diet  Circle the best answer(s) to the following statements (Hint: there may be more than one correct answer)  11. On a low-sodium diet, some acceptable snack items are:    A. Olives  F. Bean dip   K. Grapefruit juice    B. Salted Pretzels G. Commercial Popcorn   L. Canned peaches    C. Carrot Sticks  H. Bouillon   M. Unsalted nuts   D. [place] fries  I. Peanut butter crackers N. Salami   E. Sweet pickles J. Tomato Juice   O. Pizza  12.  Seasonings that may be used freely on a reduced -  sodium diet include   A. Lemon wedges F.Monosodium glutamate K. Celery seed    B.Soysauce   G. Pepper   L. Mustard powder   C. Sea salt  H. Cooking wine  M. Onion flakes   D. Vinegar  E. Prepared horseradish N. Salsa   E. Sage   J. Worcestershire sauce  O. Chutney

## 2019-07-04 DIAGNOSIS — R300 Dysuria: Secondary | ICD-10-CM | POA: Diagnosis not present

## 2019-07-04 DIAGNOSIS — I10 Essential (primary) hypertension: Secondary | ICD-10-CM | POA: Diagnosis not present

## 2019-07-04 DIAGNOSIS — K219 Gastro-esophageal reflux disease without esophagitis: Secondary | ICD-10-CM | POA: Diagnosis not present

## 2019-07-05 ENCOUNTER — Other Ambulatory Visit: Payer: Self-pay | Admitting: Cardiovascular Disease

## 2019-07-12 DIAGNOSIS — N200 Calculus of kidney: Secondary | ICD-10-CM | POA: Diagnosis not present

## 2019-07-12 DIAGNOSIS — R109 Unspecified abdominal pain: Secondary | ICD-10-CM | POA: Diagnosis not present

## 2019-07-12 DIAGNOSIS — R300 Dysuria: Secondary | ICD-10-CM | POA: Diagnosis not present

## 2019-07-23 ENCOUNTER — Encounter: Payer: Self-pay | Admitting: Urology

## 2019-07-24 DIAGNOSIS — R109 Unspecified abdominal pain: Secondary | ICD-10-CM | POA: Diagnosis not present

## 2019-07-24 DIAGNOSIS — N200 Calculus of kidney: Secondary | ICD-10-CM | POA: Diagnosis not present

## 2019-07-24 DIAGNOSIS — I10 Essential (primary) hypertension: Secondary | ICD-10-CM | POA: Diagnosis not present

## 2019-07-28 DIAGNOSIS — G4733 Obstructive sleep apnea (adult) (pediatric): Secondary | ICD-10-CM | POA: Diagnosis not present

## 2019-08-01 DIAGNOSIS — J449 Chronic obstructive pulmonary disease, unspecified: Secondary | ICD-10-CM | POA: Diagnosis not present

## 2019-08-01 DIAGNOSIS — J441 Chronic obstructive pulmonary disease with (acute) exacerbation: Secondary | ICD-10-CM | POA: Diagnosis not present

## 2019-08-05 DIAGNOSIS — N200 Calculus of kidney: Secondary | ICD-10-CM | POA: Diagnosis not present

## 2019-08-05 DIAGNOSIS — R52 Pain, unspecified: Secondary | ICD-10-CM | POA: Diagnosis not present

## 2019-08-05 DIAGNOSIS — J441 Chronic obstructive pulmonary disease with (acute) exacerbation: Secondary | ICD-10-CM | POA: Diagnosis not present

## 2019-08-05 DIAGNOSIS — J449 Chronic obstructive pulmonary disease, unspecified: Secondary | ICD-10-CM | POA: Diagnosis not present

## 2019-08-05 DIAGNOSIS — Z743 Need for continuous supervision: Secondary | ICD-10-CM | POA: Diagnosis not present

## 2019-08-05 DIAGNOSIS — I517 Cardiomegaly: Secondary | ICD-10-CM | POA: Diagnosis not present

## 2019-08-05 DIAGNOSIS — R062 Wheezing: Secondary | ICD-10-CM | POA: Diagnosis not present

## 2019-08-05 DIAGNOSIS — R0602 Shortness of breath: Secondary | ICD-10-CM | POA: Diagnosis not present

## 2019-08-05 DIAGNOSIS — Z79899 Other long term (current) drug therapy: Secondary | ICD-10-CM | POA: Diagnosis not present

## 2019-08-05 DIAGNOSIS — R109 Unspecified abdominal pain: Secondary | ICD-10-CM | POA: Diagnosis not present

## 2019-08-05 DIAGNOSIS — Z87891 Personal history of nicotine dependence: Secondary | ICD-10-CM | POA: Diagnosis not present

## 2019-08-05 DIAGNOSIS — Z880 Allergy status to penicillin: Secondary | ICD-10-CM | POA: Diagnosis not present

## 2019-08-05 DIAGNOSIS — E1140 Type 2 diabetes mellitus with diabetic neuropathy, unspecified: Secondary | ICD-10-CM | POA: Diagnosis not present

## 2019-08-05 DIAGNOSIS — R0902 Hypoxemia: Secondary | ICD-10-CM | POA: Diagnosis not present

## 2019-08-05 DIAGNOSIS — I10 Essential (primary) hypertension: Secondary | ICD-10-CM | POA: Diagnosis not present

## 2019-08-14 ENCOUNTER — Ambulatory Visit (INDEPENDENT_AMBULATORY_CARE_PROVIDER_SITE_OTHER): Payer: Medicare Other | Admitting: Urology

## 2019-08-14 ENCOUNTER — Encounter: Payer: Self-pay | Admitting: Urology

## 2019-08-14 ENCOUNTER — Other Ambulatory Visit: Payer: Self-pay

## 2019-08-14 VITALS — BP 140/74 | HR 54 | Temp 97.2°F | Ht 69.0 in | Wt 345.0 lb

## 2019-08-14 DIAGNOSIS — N200 Calculus of kidney: Secondary | ICD-10-CM | POA: Insufficient documentation

## 2019-08-14 NOTE — Patient Instructions (Signed)

## 2019-08-14 NOTE — Progress Notes (Signed)
08/14/2019 9:53 AM   Charles Mathews 02/21/1961 [id number]  Referring provider: No referring provider defined for this encounter.  Nephrolithiasis  HPI: [name] is a 59yo with a hx of nephrolithiasis here for followup. His last renal US in 01/2019 was negative for nephrolithiasis. He was diagnosed with a 24mm left renal calculus on 12/8 at [facility]. No current flank pain. NO LUTS I have reviewed his CT results, images and report from [facility].   His record from [facility] is as follows: 59 yo male presents with his Aunt for f/u of rt ureteral stone 1st symptomatic 7.11.2019, f/u 8.3.2019 w/ repeat CT.. No f/c but has apparently had recurrent flank pain. Last took pain meds yesterday. He has mental disability, so is poor historian. No blood in urine. ? change in LUTS.      PMH: Past Medical History:  Diagnosis Date  . Anxiety   . Arthritis   . Asthma    Albuterol prn  . Chronic back pain   . Chronic kidney disease    kidney stones  . Coronary artery disease    11/01/12 he denies known CAD/MI/CHF history  . Diabetes mellitus    borderline  . Gastric ulcer   . GERD (gastroesophageal reflux disease)    takes Prilosec daily  . High cholesterol    takes zocor daily  . Hypertension    takes metoprolol daily and cardura  . Insomnia   . Joint pain   . Lung mass   . Major depression    takes Zoloft and effexor daily  . Migraine    last one couple nights ago and takes Topamax daily  . Morbid obesity ([place])   . Neuropathy   . Seizures ([place])    last one a couple of months ago;takes cogentin daily  . Shortness of breath    with exertion  . Urinary frequency   . Urinary urgency     Surgical History: Past Surgical History:  Procedure Laterality Date  . LITHOTRIPSY  2009  . MULTIPLE EXTRACTIONS WITH ALVEOLOPLASTY N/A 12/17/2012   Procedure: MULTIPLE EXTRACTION WITH ALVEOLOPLASTY;  Surgeon: Gae Bon, DDS;  Location: [place];  Service: Oral Surgery;  Laterality:  N/A;    Home Medications:    Allergies:  Allergies  Allergen Reactions  . Bee Venom Anaphylaxis  . Benadryl [Diphenhydramine]     Makes him feel weird, like he is going to pass out  . Lorazepam Other (See Comments)    Makes him feel woozy.  . Lorazepam Other (See Comments)  . Penicillins Other (See Comments)    CHILDHOOD ALLERGY    Family History: Family History  Adopted: Yes    Social History:  reports that he quit smoking about 17 years ago. His smoking use included cigarettes. He has a 54.00 pack-year smoking history. He has never used smokeless tobacco. He reports that he does not drink alcohol or use drugs.  ROS:                                        Physical Exam: BP 140/74   Pulse (!) 54   Temp (!) 97.2 F (36.2 C)   Ht 5\' 9"  (1.753 m)   Wt (!) 345 lb (156.5 kg)   BMI 50.95 kg/m   Constitutional:  Alert and oriented, No acute distress. HEENT: [place] AT, moist mucus membranes.  Trachea midline,  no masses. Cardiovascular: No clubbing, cyanosis, or edema. Respiratory: Normal respiratory effort, no increased work of breathing. GI: Abdomen is soft, nontender, nondistended, no abdominal masses GU: No CVA tenderness Lymph: No cervical or inguinal lymphadenopathy. Skin: No rashes, bruises or suspicious lesions. Neurologic: Grossly intact, no focal deficits, moving all 4 extremities. Psychiatric: Normal mood and affect.  Laboratory Data: Lab Results  Component Value Date   WBC 11.9 (H) 12/14/2012   HGB 13.7 12/14/2012   HCT 41.5 12/14/2012   MCV 87.7 12/14/2012   PLT 169 12/14/2012    Lab Results  Component Value Date   CREATININE 1.04 12/14/2012    Lab Results  Component Value Date   PSA 0.15 04/02/2011    No results found for: TESTOSTERONE  No results found for: HGBA1C  Urinalysis    Component Value Date/Time   COLORURINE YELLOW 04/01/2011 [place] 04/01/2011 1533   LABSPEC 1.015 04/01/2011 1533    PHURINE 8.0 04/01/2011 1533   GLUCOSEU NEGATIVE 04/01/2011 1533   [place] 04/01/2011 1533   [place] 04/01/2011 1533   [place] 04/01/2011 1533   PROTEINUR NEGATIVE 04/01/2011 1533   UROBILINOGEN 0.2 04/01/2011 1533   NITRITE NEGATIVE 04/01/2011 1533   LEUKOCYTESUR NEGATIVE 04/01/2011 1533    No results found for: LABMICR, [place], RBCUA, LABEPIT, MUCUS, BACTERIA  Pertinent Imaging:  Results for orders placed during the hospital encounter of 10/11/18  DG Abd 1 View   Narrative CLINICAL DATA:  Right flank soreness.  EXAM: ABDOMEN - 1 VIEW  COMPARISON:  Abdominal radiographs, 06/27/2018.  CT, 03/03/2018.  FINDINGS: No evidence of renal or ureteral stones. Normal bowel gas pattern. Soft tissues are unremarkable. Skeletal structures are within normal limits.  IMPRESSION: Negative exam. No evidence of renal or ureteral stones. Intrarenal stones noted on the prior CT are either no longer present or, more likely, not visualized due to overlying bowel gas and stool.   Electronically Signed   By: Lajean Manes M.D.   On: 10/11/2018 20:56    No results found for this or any previous visit. No results found for this or any previous visit. No results found for this or any previous visit. Results for orders placed during the hospital encounter of 02/20/19  US RENAL   Narrative CLINICAL DATA:  History of ureteral stones  EXAM: RENAL / URINARY TRACT ULTRASOUND COMPLETE  COMPARISON:  [facility] 10/11/2018  FINDINGS: Right Kidney:  Renal measurements: 12.2 x 5.6 x 5.4 cm = volume: 194 mL . Echogenicity within normal limits. No mass or hydronephrosis visualized.  Left Kidney:  Renal measurements: 12.2 x 4.9 x 5.4 cm = volume: 169 mL. Echogenicity within normal limits. No mass or hydronephrosis visualized.  Bladder:  Appears normal for degree of bladder distention.  IMPRESSION: No acute findings.  No hydronephrosis.   Electronically Signed    By: Rolm Baptise M.D.   On: 02/20/2019 12:10    No results found for this or any previous visit. No results found for this or any previous visit. No results found for this or any previous visit.  Assessment & Plan:    1. Nephrolithiasis -Observation -RTC 6 months with renal US   No follow-ups on file.  Nicolette Bang, MD  [facility] [place]

## 2019-08-27 ENCOUNTER — Encounter: Payer: Self-pay | Admitting: Nutrition

## 2019-08-27 ENCOUNTER — Other Ambulatory Visit: Payer: Self-pay

## 2019-08-27 ENCOUNTER — Encounter: Payer: Medicare Other | Attending: Physician Assistant | Admitting: Nutrition

## 2019-08-27 VITALS — Ht 69.0 in | Wt 343.0 lb

## 2019-08-27 DIAGNOSIS — Z6841 Body Mass Index (BMI) 40.0 and over, adult: Secondary | ICD-10-CM | POA: Diagnosis not present

## 2019-08-27 DIAGNOSIS — Z713 Dietary counseling and surveillance: Secondary | ICD-10-CM | POA: Diagnosis not present

## 2019-08-27 DIAGNOSIS — R739 Hyperglycemia, unspecified: Secondary | ICD-10-CM | POA: Diagnosis present

## 2019-08-27 DIAGNOSIS — E669 Obesity, unspecified: Secondary | ICD-10-CM | POA: Insufficient documentation

## 2019-08-27 NOTE — Patient Instructions (Signed)
Goal  Follow My Plate Eat three balanced meals Drink only water  Eat more vegetables Cut out junk food Increase walking in house. Lose 5 lbs per month

## 2019-08-27 NOTE — Progress Notes (Signed)
  Medical Nutrition Therapy:  Appt start time: 1330 end time:  1430.   Assessment:  Primary concerns today: Obesity. PreDM. He notes he is depressed and eats all the time. 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 Gaine 25+ lbs since December. Has a therapist he is working with for his depression. Sees them on  This Sunday. Has history of falls. Wlling to work on [employer] choices to lose weight and feel better.  He notes he has a hard knot on his right side of stomach. He notes he has some heart issues. Advised to follow up with his PCP and or see a cardiologist for his heart issues.    Visual teaching tools used. He has an air fryer. Cooks at home some but eats fast food often. Admits to eating a lot of processed junk food. Has an aide that is suppose to help with meals.  Hepatic Function Latest Ref Rng & Units 04/02/2011 04/01/2011 03/31/2011  Total Protein 6.0 - 8.3 g/dL 7.1 6.0 6.7  Albumin 3.5 - 5.2 g/dL 3.8 3.5 4.0  AST 0 - 37 U/L '16 15 16  '$ ALT 0 - 53 U/L '20 18 21  '$ Alk Phosphatase 39 - 117 U/L 85 88 93  Total Bilirubin 0.3 - 1.2 mg/dL 0.6 0.6 0.5   .   Preferred Learning Style:  No preference indicated   Learning Readiness:  Ready  Change in progress   MEDICATIONS:  See list   DIETARY INTAKE:   24-hr recall:  B ( AM): Sausage biscuits, Snk ( AM):  L ( PM): Baked chicken,  Water, cranberry juice, Snk ( PM): D ( PM): Chicken, and Hamburger with fries, Diet. Dr. Dorthula Nettles ( PM): Beverages: Diet soda,   Usual physical activity: ADL  Estimated energy needs: 1800 calories 200  g carbohydrates  135g protein  50 g fat  Progress Towards Goal(s):  In progress.   Nutritional Diagnosis:  NB-1.1 Food and nutrition-related knowledge deficit As related to [place] obesity.  As evidenced by 25 lb wt gain and BMI 50. NI-1.7 Predicted excessive energy intake As related to OBesity.  As evidenced by BMI 50.    Intervention:  Nutrition and prediabetes  education, weight loss  on My Plate, CHO counting, meal planning, portion sizes, timing of meals, avoiding snacks between meals  taking medications as prescribed, benefits of exercising 30 minutes per day and prevention of DM. Goal  Follow My Plate Eat three balanced meals Drink only water  Eat more vegetables Cut out junk food Increase walking in house. Lose 5 lbs per month   Teaching Method Utilized:  Visual Auditory Hands on  Handouts given during visit include:  The Plate Method   Diabetes Instructions   Barriers to learning/adherence to lifestyle change: 3rd grade education  Demonstrated degree of understanding via:  Teach Back   Monitoring/Evaluation:  Dietary intake, exercise, , and body weight in 1 month(s). >>>Recommend better follow up and treatment for his depression and overeating causing weight gain.>>>>

## 2019-08-28 DIAGNOSIS — G4733 Obstructive sleep apnea (adult) (pediatric): Secondary | ICD-10-CM | POA: Diagnosis not present

## 2019-08-30 DIAGNOSIS — I10 Essential (primary) hypertension: Secondary | ICD-10-CM | POA: Diagnosis not present

## 2019-09-27 DIAGNOSIS — I10 Essential (primary) hypertension: Secondary | ICD-10-CM | POA: Diagnosis not present

## 2019-09-27 DIAGNOSIS — J4542 Moderate persistent asthma with status asthmaticus: Secondary | ICD-10-CM | POA: Diagnosis not present

## 2019-09-27 DIAGNOSIS — J449 Chronic obstructive pulmonary disease, unspecified: Secondary | ICD-10-CM | POA: Diagnosis not present

## 2019-10-01 ENCOUNTER — Encounter: Payer: Medicare Other | Attending: Physician Assistant | Admitting: Nutrition

## 2019-10-01 ENCOUNTER — Encounter: Payer: Self-pay | Admitting: Nutrition

## 2019-10-01 ENCOUNTER — Other Ambulatory Visit: Payer: Self-pay

## 2019-10-01 VITALS — Ht 69.0 in | Wt 334.0 lb

## 2019-10-01 DIAGNOSIS — E782 Mixed hyperlipidemia: Secondary | ICD-10-CM

## 2019-10-01 DIAGNOSIS — E669 Obesity, unspecified: Secondary | ICD-10-CM | POA: Diagnosis not present

## 2019-10-01 DIAGNOSIS — I10 Essential (primary) hypertension: Secondary | ICD-10-CM

## 2019-10-01 DIAGNOSIS — Z713 Dietary counseling and surveillance: Secondary | ICD-10-CM | POA: Diagnosis not present

## 2019-10-01 DIAGNOSIS — Z6841 Body Mass Index (BMI) 40.0 and over, adult: Secondary | ICD-10-CM | POA: Insufficient documentation

## 2019-10-01 DIAGNOSIS — Z87898 Personal history of other specified conditions: Secondary | ICD-10-CM

## 2019-10-01 DIAGNOSIS — R739 Hyperglycemia, unspecified: Secondary | ICD-10-CM

## 2019-10-01 NOTE — Progress Notes (Signed)
  Medical Nutrition Therapy:  Appt start time: 1330 end time:  1400  Assessment:  Primary concerns today: Obesity. PreDM. He notes he is depressed and eats all the time. 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 Lost 9 lbs.  Has been fruits and vegetables. Cut down on bread, cut out drinks. Drinking more water with lemon. Using air fryer. Reports some chest pain at times that he notes his aide feels is indigestion. He has gone to ER numerous times for it and its ingestion. Encouraged to avoid fried and greasy foods for weight loss and GERD.   Hepatic Function Latest Ref Rng & Units 04/02/2011 04/01/2011 03/31/2011  Total Protein 6.0 - 8.3 g/dL 7.1 6.0 6.7  Albumin 3.5 - 5.2 g/dL 3.8 3.5 4.0  AST 0 - 37 U/L '16 15 16  '$ ALT 0 - 53 U/L '20 18 21  '$ Alk Phosphatase 39 - 117 U/L 85 88 93  Total Bilirubin 0.3 - 1.2 mg/dL 0.6 0.6 0.5   .   Preferred Learning Style:  No preference indicated   Learning Readiness:  Ready  Change in progress   MEDICATIONS:  See list   DIETARY INTAKE:   24-hr recall:  B ( AM):  Oatmeal,  Snk ( AM):  L ( PM):Chicken, Diet coke Snk ( PM): D ( PM): Chicken cheese steak, 9", Diet coke Snk ( PM): Beverages: Diet soda,   Usual physical activity: ADL  Estimated energy needs: 1800 calories 200  g carbohydrates  135g protein  50 g fat  Progress Towards Goal(s):  In progress.   Nutritional Diagnosis:  NB-1.1 Food and nutrition-related knowledge deficit As related to Morivd obesity.  As evidenced by 25 lb wt gain and BMI 50. NI-1.7 Predicted excessive energy intake As related to OBesity.  As evidenced by BMI 50.    Intervention:  Nutrition and prediabetes education, weight loss  on My Plate, CHO counting, meal planning, portion sizes, timing of meals, avoiding snacks between meals  taking medications as prescribed, benefits of exercising 30 minutes per day and prevention of DM. Goal  Goals  Increase walking in halls for 30 minutes a day Drinking more  water with lemon and cut out diet sodas. Increase fresh fruits and vegetables. Lose 1-2 lbs per week Make an appt about chest pains.   Teaching Method Utilized:  Visual Auditory Hands on  Handouts given during visit include:  The Plate Method   Diabetes Instructions   Barriers to learning/adherence to lifestyle change: 3rd grade education  Demonstrated degree of understanding via:  Teach Back   Monitoring/Evaluation:  Dietary intake, exercise, , and body weight in 1 month(s). Follow up about reflux vs chest pains with cardiology.

## 2019-10-01 NOTE — Patient Instructions (Addendum)
  Goals  Increase walking in halls for 30 minutes a day Drinking more water with lemon and cut out diet sodas. Increase fresh fruits and vegetables. Lose 1-2 lbs per week Make an appt about chest pains.

## 2019-10-02 ENCOUNTER — Encounter: Payer: Self-pay | Admitting: Nutrition

## 2019-10-08 DIAGNOSIS — J449 Chronic obstructive pulmonary disease, unspecified: Secondary | ICD-10-CM | POA: Diagnosis not present

## 2019-10-29 DIAGNOSIS — K219 Gastro-esophageal reflux disease without esophagitis: Secondary | ICD-10-CM | POA: Diagnosis not present

## 2019-10-29 DIAGNOSIS — D519 Vitamin B12 deficiency anemia, unspecified: Secondary | ICD-10-CM | POA: Diagnosis not present

## 2019-10-29 DIAGNOSIS — R739 Hyperglycemia, unspecified: Secondary | ICD-10-CM | POA: Diagnosis not present

## 2019-10-29 DIAGNOSIS — E559 Vitamin D deficiency, unspecified: Secondary | ICD-10-CM | POA: Diagnosis not present

## 2019-10-29 DIAGNOSIS — I10 Essential (primary) hypertension: Secondary | ICD-10-CM | POA: Diagnosis not present

## 2019-10-30 DIAGNOSIS — I10 Essential (primary) hypertension: Secondary | ICD-10-CM | POA: Diagnosis not present

## 2019-10-30 DIAGNOSIS — E7849 Other hyperlipidemia: Secondary | ICD-10-CM | POA: Diagnosis not present

## 2019-11-05 DIAGNOSIS — J449 Chronic obstructive pulmonary disease, unspecified: Secondary | ICD-10-CM | POA: Diagnosis not present

## 2019-11-05 DIAGNOSIS — N200 Calculus of kidney: Secondary | ICD-10-CM | POA: Diagnosis not present

## 2019-11-05 DIAGNOSIS — I10 Essential (primary) hypertension: Secondary | ICD-10-CM | POA: Diagnosis not present

## 2019-11-05 DIAGNOSIS — R739 Hyperglycemia, unspecified: Secondary | ICD-10-CM | POA: Diagnosis not present

## 2019-11-29 DIAGNOSIS — N200 Calculus of kidney: Secondary | ICD-10-CM | POA: Diagnosis not present

## 2019-11-29 DIAGNOSIS — J449 Chronic obstructive pulmonary disease, unspecified: Secondary | ICD-10-CM | POA: Diagnosis not present

## 2019-12-03 DIAGNOSIS — J441 Chronic obstructive pulmonary disease with (acute) exacerbation: Secondary | ICD-10-CM | POA: Diagnosis not present

## 2019-12-20 ENCOUNTER — Other Ambulatory Visit: Payer: Self-pay | Admitting: Physician Assistant

## 2019-12-23 DIAGNOSIS — J441 Chronic obstructive pulmonary disease with (acute) exacerbation: Secondary | ICD-10-CM | POA: Diagnosis not present

## 2019-12-23 DIAGNOSIS — J019 Acute sinusitis, unspecified: Secondary | ICD-10-CM | POA: Diagnosis not present

## 2019-12-30 DIAGNOSIS — Z87891 Personal history of nicotine dependence: Secondary | ICD-10-CM | POA: Diagnosis not present

## 2019-12-30 DIAGNOSIS — J441 Chronic obstructive pulmonary disease with (acute) exacerbation: Secondary | ICD-10-CM | POA: Diagnosis not present

## 2019-12-30 DIAGNOSIS — I10 Essential (primary) hypertension: Secondary | ICD-10-CM | POA: Diagnosis not present

## 2019-12-30 DIAGNOSIS — I2510 Atherosclerotic heart disease of native coronary artery without angina pectoris: Secondary | ICD-10-CM | POA: Diagnosis not present

## 2020-01-14 ENCOUNTER — Ambulatory Visit: Payer: Medicare Other | Admitting: Nutrition

## 2020-01-16 ENCOUNTER — Other Ambulatory Visit: Payer: Self-pay | Admitting: Physician Assistant

## 2020-01-28 ENCOUNTER — Other Ambulatory Visit: Payer: Self-pay

## 2020-01-28 ENCOUNTER — Encounter: Payer: Self-pay | Admitting: Nutrition

## 2020-01-28 ENCOUNTER — Encounter: Payer: Medicare Other | Attending: Physician Assistant | Admitting: Nutrition

## 2020-01-28 VITALS — Ht 69.0 in | Wt 336.0 lb

## 2020-01-28 DIAGNOSIS — E782 Mixed hyperlipidemia: Secondary | ICD-10-CM | POA: Insufficient documentation

## 2020-01-28 DIAGNOSIS — R739 Hyperglycemia, unspecified: Secondary | ICD-10-CM | POA: Insufficient documentation

## 2020-01-28 DIAGNOSIS — I10 Essential (primary) hypertension: Secondary | ICD-10-CM | POA: Insufficient documentation

## 2020-01-28 DIAGNOSIS — E669 Obesity, unspecified: Secondary | ICD-10-CM | POA: Insufficient documentation

## 2020-01-28 NOTE — Patient Instructions (Addendum)
Goals  Take medication as prescribed daily. Do not skip doses.  Be sure to take medications on weekends also. Eat breakfast by 9 am, lunch by 1-2 and Dinner by 6 pm. Avoid snacks between meals Lose 1 lb per week. Drink only water and cut out diet sodas. Increase more lower carb vegetables. Go to the [facility] three times per week.

## 2020-01-28 NOTE — Progress Notes (Signed)
  Medical Nutrition Therapy:  Appt start time: 1115 end time:  1130 Assessment:  Primary concerns today: Obesity. PreDM. He notes he has been having breathing issues. His caretaker brought him for his visit. He has been missing doses of medications especially on weekends when caretaker isn't there.  Depression is up and down.  He is on Zoloft. Sees a therapist for his depression.  Has been trying to buy more fruit and vegetables. Drinking Gatorade and some diet sodas.. Allergic to eggs. Gained 2 lbs.  Needs compliance with medications and diet restrictions. He is going to start going to the [facility] starting tomorrow to do walking in the pool for exercise.   Hepatic Function Latest Ref Rng & Units 04/02/2011 04/01/2011 03/31/2011  Total Protein 6.0 - 8.3 g/dL 7.1 6.0 6.7  Albumin 3.5 - 5.2 g/dL 3.8 3.5 4.0  AST 0 - 37 U/L _0 ALT 0 - 53 U/L _1 Alk Phosphatase 39 - 117 U/L 85 88 93  Total Bilirubin 0.3 - 1.2 mg/dL 0.6 0.6 0.5   .   Preferred Learning Style:  No preference indicated   Learning Readiness:  Ready  Change in progress   MEDICATIONS:  See list   DIETARY INTAKE:   24-hr recall:  Brunch  ( 10  AM):  Fruit cocktail and cottage cheese, waffles, 2 with FF syrup D ( 6 PM): Chef salad grilled chicken, Water Snk ( PM):banana  Beverages:water, 2 diet sodas   Usual physical activity: ADL  Estimated energy needs: 1800 calories 200  g carbohydrates  135g protein  50 g fat  Progress Towards Goal(s):  In progress.   Nutritional Diagnosis:  NB-1.1 Food and nutrition-related knowledge deficit As related to Morivd obesity.  As evidenced by 25 lb wt gain and BMI 50. NI-1.7 Predicted excessive energy intake As related to OBesity.  As evidenced by BMI 50.    Intervention:  Nutrition and prediabetes education, weight loss  on My Plate, CHO counting, meal planning, portion sizes, timing of meals, avoiding snacks between meals  taking medications as prescribed,  benefits of exercising 30 minutes per day and prevention of DM. Goal  Goals  You need to take your medications as prescribed daily and not forget them on weekends.. Eat breakfast by 9 am, lunch by 1-2 and Dinner by 6 pm. Avoid snacks between meals Lose 1 lb per week. Drink only water and cut out diet sodas. Increase more lower carb vegetables. Go to the [facility] three times per week.   Teaching Method Utilized:  Visual Auditory Hands on  Handouts given during visit include:  The Plate Method   Diabetes Instructions   Barriers to learning/adherence to lifestyle change: 3rd grade education  Demonstrated degree of understanding via:  Teach Back   Monitoring/Evaluation:  Dietary intake, exercise, , and body weight in 3 month(s).

## 2020-01-29 DIAGNOSIS — I2510 Atherosclerotic heart disease of native coronary artery without angina pectoris: Secondary | ICD-10-CM | POA: Diagnosis not present

## 2020-01-29 DIAGNOSIS — J441 Chronic obstructive pulmonary disease with (acute) exacerbation: Secondary | ICD-10-CM | POA: Diagnosis not present

## 2020-01-29 DIAGNOSIS — Z87891 Personal history of nicotine dependence: Secondary | ICD-10-CM | POA: Diagnosis not present

## 2020-01-29 DIAGNOSIS — I10 Essential (primary) hypertension: Secondary | ICD-10-CM | POA: Diagnosis not present

## 2020-02-10 ENCOUNTER — Ambulatory Visit (HOSPITAL_COMMUNITY)
Admission: RE | Admit: 2020-02-10 | Discharge: 2020-02-10 | Disposition: A | Discharge status: Home / Self Care | Payer: Medicare Other | Source: Ambulatory Visit | Attending: Urology | Admitting: Urology

## 2020-02-10 ENCOUNTER — Other Ambulatory Visit: Payer: Self-pay

## 2020-02-10 DIAGNOSIS — N200 Calculus of kidney: Secondary | ICD-10-CM | POA: Diagnosis not present

## 2020-02-10 DIAGNOSIS — Q600 Renal agenesis, unilateral: Secondary | ICD-10-CM | POA: Diagnosis not present

## 2020-02-12 ENCOUNTER — Ambulatory Visit: Payer: Medicare Other | Admitting: Urology

## 2020-02-12 ENCOUNTER — Ambulatory Visit (INDEPENDENT_AMBULATORY_CARE_PROVIDER_SITE_OTHER): Payer: Medicare Other | Admitting: Urology

## 2020-02-12 ENCOUNTER — Other Ambulatory Visit (HOSPITAL_COMMUNITY)
Admission: AD | Admit: 2020-02-12 | Discharge: 2020-02-12 | Disposition: A | Discharge status: Home / Self Care | Payer: Medicare Other | Source: Skilled Nursing Facility | Attending: Urology | Admitting: Urology

## 2020-02-12 ENCOUNTER — Other Ambulatory Visit: Payer: Self-pay

## 2020-02-12 VITALS — BP 125/56 | HR 59 | Temp 98.7°F | Ht 69.0 in | Wt 336.0 lb

## 2020-02-12 DIAGNOSIS — N3000 Acute cystitis without hematuria: Secondary | ICD-10-CM

## 2020-02-12 DIAGNOSIS — N200 Calculus of kidney: Secondary | ICD-10-CM | POA: Diagnosis not present

## 2020-02-12 LAB — POCT URINALYSIS DIPSTICK
Glucose, UA: NEGATIVE
Ketones, UA: NEGATIVE
Nitrite, UA: NEGATIVE
Protein, UA: NEGATIVE
Spec Grav, UA: 1.015 (ref 1.010–1.025)
Urobilinogen, UA: 0.2 E.U./dL
pH, UA: 6 (ref 5.0–8.0)

## 2020-02-12 NOTE — Progress Notes (Signed)
Urological Symptom Review  Patient is experiencing the following symptoms: Frequent urination Burning/pain with urination Get up at night to urinate Kidney Stones  Review of Systems  Gastrointestinal (upper)  : Indigestion/heartburn  Gastrointestinal (lower) : Negative for lower GI symptoms  Constitutional : Negative for symptoms  Skin: Negative for skin symptoms  Eyes: Blurred vision  Ear/Nose/Throat : Negative for Ear/Nose/Throat symptoms  Hematologic/Lymphatic: Negative for Hematologic/Lymphatic symptoms  Cardiovascular : Negative for cardiovascular symptoms  Respiratory : Shortness of breath  Endocrine: Negative for endocrine symptoms  Musculoskeletal: Negative for musculoskeletal symptoms  Neurological: Headaches  Psychologic: Depression Anxiety

## 2020-02-12 NOTE — Progress Notes (Signed)
02/12/2020 11:07 AM   Charles Mathews 11/10/1960 [id number]  Referring provider: No referring provider defined for this encounter.  nephrolithiasis  HPI: Mr Riolo is a 59yo here for followup for nephrolithiasis. No stone events since last visit. Renal US from 02/10/2020 showed a 6m left lower pole calculus which has increased in size since his last renal [place] He has new urinary frequency, urgency, dysuria for the past 2 months. He notes his urine is foul smelling. NO other associated symptoms. NO hematuria   PMH: Past Medical History:  Diagnosis Date  . Anxiety   . Arthritis   . Asthma    Albuterol prn  . Chronic back pain   . Chronic kidney disease    kidney stones  . Coronary artery disease    11/01/12 he denies known CAD/MI/CHF history  . Diabetes mellitus    borderline  . Gastric ulcer   . GERD (gastroesophageal reflux disease)    takes Prilosec daily  . High cholesterol    takes zocor daily  . Hypertension    takes metoprolol daily and cardura  . Insomnia   . Joint pain   . Lung mass   . Major depression    takes Zoloft and effexor daily  . Migraine    last one couple nights ago and takes Topamax daily  . Morbid obesity ([place]   . Neuropathy   . Seizures ([place]    last one a couple of months ago;takes cogentin daily  . Shortness of breath    with exertion  . Urinary frequency   . Urinary urgency     Surgical History: Past Surgical History:  Procedure Laterality Date  . LITHOTRIPSY  2009  . MULTIPLE EXTRACTIONS WITH ALVEOLOPLASTY N/A 12/17/2012   Procedure: MULTIPLE EXTRACTION WITH ALVEOLOPLASTY;  Surgeon: SGae Bon DDS;  [place]  Service: Oral Surgery;  Laterality: N/A;    Home Medications:  Allergies as of 02/12/2020      Reactions   Bee Venom Anaphylaxis   Benadryl [diphenhydramine]    Makes him feel weird, like he is going to pass out   Lorazepam Other (See Comments)   Makes him feel woozy.   Lorazepam Other (See Comments)    Penicillins Other (See Comments)   CHILDHOOD ALLERGY      Medication List       Accurate as of February 12, 2020 11:07 AM. If you have any questions, ask your nurse or doctor.        Accu-Chek Guide test strip Generic drug: glucose blood USE ONCE DAILY TO TEST SUGAR   Accu-Chek Guide w/Device Kit USE ONCE DAILY TO TEST SUAGR   Accu-Chek Softclix Lancets lancets USE TO TEST ONCE DAILY   albuterol 108 (90 Base) MCG/ACT inhaler Commonly known as: VENTOLIN HFA INHALE TWO PUFFS BY MOUTH FOUR TIMES DAILY   ALBUTEROL IN Inhale into the lungs.   aspirin EC 81 MG tablet Take 81 mg by mouth daily.   benztropine 1 MG tablet Commonly known as: COGENTIN Take one tablet at night   budesonide 0.5 MG/2ML nebulizer solution Commonly known as: PULMICORT Take by nebulization 2 (two) times daily.   doxazosin 2 MG tablet Commonly known as: CARDURA TAKE 1 TABLET BY MOUTH DAILY   furosemide 40 MG tablet Commonly known as: LASIX TAKE 1 TABLET BY MOUTH DAILY   HYDROcodone-acetaminophen 5-325 MG tablet Commonly known as: NORCO/VICODIN Take 1 tablet by mouth every 8 (eight) hours.   Invega 3 MG 24 hr  tablet Generic drug: paliperidone Take one (1) tablet by mouth every morning   ipratropium-albuterol 0.5-2.5 (3) MG/3ML Soln Commonly known as: DUONEB Take 3 mLs by nebulization 4 (four) times daily.   metoCLOPramide 10 MG tablet Commonly known as: REGLAN Take 10 mg by mouth 4 (four) times daily as needed.   metoprolol tartrate 50 MG tablet Commonly known as: LOPRESSOR TAKE 1 TABLET BY MOUTH TWICE DAILY   omeprazole 20 MG capsule Commonly known as: PRILOSEC Take 20 mg by mouth every morning.   potassium chloride SA 20 MEQ tablet Commonly known as: KLOR-CON TAKE 1 TABLET BY MOUTH DAILY   sertraline 100 MG tablet Commonly known as: ZOLOFT Take 200 mg by mouth daily.   simvastatin 40 MG tablet Commonly known as: ZOCOR Take 1 tablet (40 mg total) by mouth daily.   Stiolto  Respimat 2.5-2.5 MCG/ACT Aers Generic drug: Tiotropium Bromide-Olodaterol SMARTSIG:2 Puff(s) By Mouth Daily   topiramate 50 MG tablet Commonly known as: TOPAMAX Take 50 mg by mouth 2 (two) times daily.   traZODone 100 MG tablet Commonly known as: DESYREL Take one (1) tablet by mouth at bedtime, as needed       Allergies:  Allergies  Allergen Reactions  . Bee Venom Anaphylaxis  . Benadryl [Diphenhydramine]     Makes him feel weird, like he is going to pass out  . Lorazepam Other (See Comments)    Makes him feel woozy.  . Lorazepam Other (See Comments)  . Penicillins Other (See Comments)    CHILDHOOD ALLERGY    Family History: Family History  Adopted: Yes    Social History:  reports that he quit smoking about 17 years ago. His smoking use included cigarettes. He has a 54.00 pack-year smoking history. He has never used smokeless tobacco. He reports that he does not drink alcohol and does not use drugs.  ROS: All other review of systems were reviewed and are negative except what is noted above in HPI  Physical Exam: BP (!) 125/56   Pulse (!) 59   Temp 98.7 F (37.1 C)   Ht _0  (1.753 m)   Wt (!) 336 lb (152.4 kg)   BMI 49.62 kg/m   Constitutional:  Alert and oriented, No acute distress. HEENT: [place] AT, moist mucus membranes.  Trachea midline, no masses. Cardiovascular: No clubbing, cyanosis, or edema. Respiratory: Normal respiratory effort, no increased work of breathing. GI: Abdomen is soft, nontender, nondistended, no abdominal masses GU: No CVA tenderness.  Lymph: No cervical or inguinal lymphadenopathy. Skin: No rashes, bruises or suspicious lesions. Neurologic: Grossly intact, no focal deficits, moving all 4 extremities. Psychiatric: Normal mood and affect.  Laboratory Data: Lab Results  Component Value Date   WBC 11.9 (H) 12/14/2012   HGB 13.7 12/14/2012   HCT 41.5 12/14/2012   MCV 87.7 12/14/2012   PLT 169 12/14/2012    Lab Results  Component  Value Date   CREATININE 1.04 12/14/2012    Lab Results  Component Value Date   PSA 0.15 04/02/2011    No results found for: TESTOSTERONE  No results found for: HGBA1C  Urinalysis    Component Value Date/Time   COLORURINE YELLOW 04/01/2011 [place] 04/01/2011 1533   LABSPEC 1.015 04/01/2011 1533   PHURINE 8.0 04/01/2011 1533   [place] 04/01/2011 1533   [place] 04/01/2011 1533   [place] 04/01/2011 1533   [place] 04/01/2011 1533   PROTEINUR NEGATIVE 04/01/2011 1533   UROBILINOGEN 0.2 04/01/2011 1533  NITRITE NEGATIVE 04/01/2011 [place] 04/01/2011 1533    No results found for: LABMICR, WBCUA, RBCUA, LABEPIT, MUCUS, BACTERIA  Pertinent Imaging: Renal US 7/12: Images reviewed and discussed with the patient Results for orders placed during the hospital encounter of 10/11/18  DG Abd 1 View  Narrative CLINICAL DATA:  Right flank soreness.  EXAM: ABDOMEN - 1 VIEW  COMPARISON:  Abdominal radiographs, 06/27/2018.  CT, 03/03/2018.  FINDINGS: No evidence of renal or ureteral stones. Normal bowel gas pattern. Soft tissues are unremarkable. Skeletal structures are within normal limits.  IMPRESSION: Negative exam. No evidence of renal or ureteral stones. Intrarenal stones noted on the prior CT are either no longer present or, more likely, not visualized due to overlying bowel gas and stool.   Electronically Signed By: Lajean Manes M.D. On: 10/11/2018 20:56  No results found for this or any previous visit.  No results found for this or any previous visit.  No results found for this or any previous visit.  Results for orders placed during the hospital encounter of 02/10/20  Ultrasound renal complete  Narrative CLINICAL DATA:  Nephrolithiasis, follow-up  EXAM: RENAL / URINARY TRACT ULTRASOUND COMPLETE  COMPARISON:  02/20/2019  FINDINGS: Right Kidney:  Renal measurements: 13 x  5.8 x 4.7 cm = volume: 185.5 mL . Echogenicity within normal limits. No mass or hydronephrosis visualized.  Left Kidney:  Renal measurements: 12.5 x 4.8 x 4.8 cm = volume: 151.5 mL. Echogenicity within normal limits. There is a 7 mm nonobstructing calculus of the lower pole. No mass or hydronephrosis visualized.  Bladder:  Appears normal for degree of bladder distention.  Other:  None.  IMPRESSION: 7 mm nonobstructing left renal calculus.   Electronically Signed By: Macy Mis M.D. On: 02/10/2020 16:01  No results found for this or any previous visit.  No results found for this or any previous visit.  No results found for this or any previous visit.   Assessment & Plan:    1. Nephrolithiasis -We discussed the management of kidney stones. These options include observation, ureteroscopy, shockwave lithotripsy (ESWL) and percutaneous nephrolithotomy (PCNL). We discussed which options are relevant to the patient's stone(s). We discussed the natural history of kidney stones as well as the complications of untreated stones and the impact on quality of life without treatment as well as with each of the above listed treatments. We also discussed the efficacy of each treatment in its ability to clear the stone burden. With any of these management options I discussed the signs and symptoms of infection and the need for emergent treatment should these be experienced. For each option we discussed the ability of each procedure to clear the patient of their stone burden.   For observation I described the risks which include but are not limited to silent renal damage, life-threatening infection, need for emergent surgery, failure to pass stone and pain.   For ureteroscopy I described the risks which include bleeding, infection, damage to contiguous structures, positioning injury, ureteral stricture, ureteral avulsion, ureteral injury, need for prolonged ureteral stent, inability to  perform ureteroscopy, need for an interval procedure, inability to clear stone burden, stent discomfort/pain, heart attack, stroke, pulmonary embolus and the inherent risks with general anesthesia.   For shockwave lithotripsy I described the risks which include arrhythmia, kidney contusion, kidney hemorrhage, need for transfusion, pain, inability to adequately break up stone, inability to pass stone fragments, Steinstrasse, infection associated with obstructing stones, need for alternate surgical procedure, need for  repeat shockwave lithotripsy, MI, CVA, PE and the inherent risks with anesthesia/conscious sedation.   For PCNL I described the risks including positioning injury, pneumothorax, hydrothorax, need for chest tube, inability to clear stone burden, renal laceration, arterial venous fistula or malformation, need for embolization of kidney, loss of kidney or renal function, need for repeat procedure, need for prolonged nephrostomy tube, ureteral avulsion, MI, CVA, PE and the inherent risks of general anesthesia.   - The patient would like to proceed with observation. He will followup in 6 months with a renal US   No follow-ups on file.  Nicolette Bang, MD  [facility] [place]

## 2020-02-14 ENCOUNTER — Telehealth: Payer: Self-pay | Admitting: Urology

## 2020-02-14 LAB — URINE CULTURE

## 2020-02-14 NOTE — Telephone Encounter (Signed)
Patients aide called and states she needs a call back because this pts meds are not at the pharmacy.

## 2020-02-14 NOTE — Telephone Encounter (Signed)
Pt was under the impression you were giving him an antibiotic. I do not see that in your OV note but I do see culture was sent. I told aide most likely the antibiotic was if culture was positive. Culture report sent to you.

## 2020-02-17 ENCOUNTER — Other Ambulatory Visit: Payer: Self-pay

## 2020-02-17 NOTE — Telephone Encounter (Signed)
Pt has allergy to penicillin with hives. Caregiver reports his reaction to high. Please advise on antibiotic

## 2020-02-17 NOTE — Telephone Encounter (Signed)
Please send ceftin 500mg  BID for 7 days

## 2020-02-18 ENCOUNTER — Telehealth: Payer: Self-pay | Admitting: Urology

## 2020-02-18 NOTE — Telephone Encounter (Signed)
Pts wife called stating meds were not at pharmacy.

## 2020-02-19 ENCOUNTER — Other Ambulatory Visit: Payer: Self-pay

## 2020-02-19 MED ORDER — CEFUROXIME AXETIL 500 MG PO TABS: 500.0000 mg | ORAL_TABLET | Freq: Two times a day (BID) | ORAL | 0 refills | Status: DC

## 2020-02-19 NOTE — Telephone Encounter (Signed)
Caregiver of notified of antibiotic sent to pharmacy and Dr. Alyson Ingles information on drug. Verbalized understanding.

## 2020-02-19 NOTE — Telephone Encounter (Signed)
See other task

## 2020-02-19 NOTE — Telephone Encounter (Signed)
There is a less than 10% cross reaction with ceftin. He did not have shortness of breath with PCN

## 2020-02-21 ENCOUNTER — Encounter: Payer: Self-pay | Admitting: Urology

## 2020-02-21 NOTE — Patient Instructions (Signed)

## 2020-02-28 DIAGNOSIS — Z87891 Personal history of nicotine dependence: Secondary | ICD-10-CM | POA: Diagnosis not present

## 2020-02-28 DIAGNOSIS — J441 Chronic obstructive pulmonary disease with (acute) exacerbation: Secondary | ICD-10-CM | POA: Diagnosis not present

## 2020-02-28 DIAGNOSIS — I10 Essential (primary) hypertension: Secondary | ICD-10-CM | POA: Diagnosis not present

## 2020-02-28 DIAGNOSIS — I2510 Atherosclerotic heart disease of native coronary artery without angina pectoris: Secondary | ICD-10-CM | POA: Diagnosis not present

## 2020-03-10 DIAGNOSIS — J441 Chronic obstructive pulmonary disease with (acute) exacerbation: Secondary | ICD-10-CM | POA: Diagnosis not present

## 2020-03-31 DIAGNOSIS — I2510 Atherosclerotic heart disease of native coronary artery without angina pectoris: Secondary | ICD-10-CM | POA: Diagnosis not present

## 2020-03-31 DIAGNOSIS — I10 Essential (primary) hypertension: Secondary | ICD-10-CM | POA: Diagnosis not present

## 2020-03-31 DIAGNOSIS — J441 Chronic obstructive pulmonary disease with (acute) exacerbation: Secondary | ICD-10-CM | POA: Diagnosis not present

## 2020-03-31 DIAGNOSIS — Z87891 Personal history of nicotine dependence: Secondary | ICD-10-CM | POA: Diagnosis not present

## 2020-04-20 ENCOUNTER — Ambulatory Visit: Payer: Medicare Other | Admitting: Nutrition

## 2020-04-20 DIAGNOSIS — J411 Mucopurulent chronic bronchitis: Secondary | ICD-10-CM | POA: Diagnosis not present

## 2020-04-22 DIAGNOSIS — J449 Chronic obstructive pulmonary disease, unspecified: Secondary | ICD-10-CM | POA: Diagnosis not present

## 2020-04-22 DIAGNOSIS — J411 Mucopurulent chronic bronchitis: Secondary | ICD-10-CM | POA: Diagnosis not present

## 2020-04-30 DIAGNOSIS — J441 Chronic obstructive pulmonary disease with (acute) exacerbation: Secondary | ICD-10-CM | POA: Diagnosis not present

## 2020-04-30 DIAGNOSIS — I10 Essential (primary) hypertension: Secondary | ICD-10-CM | POA: Diagnosis not present

## 2020-04-30 DIAGNOSIS — Z87891 Personal history of nicotine dependence: Secondary | ICD-10-CM | POA: Diagnosis not present

## 2020-05-04 DIAGNOSIS — J449 Chronic obstructive pulmonary disease, unspecified: Secondary | ICD-10-CM | POA: Diagnosis not present

## 2020-05-04 DIAGNOSIS — K219 Gastro-esophageal reflux disease without esophagitis: Secondary | ICD-10-CM | POA: Diagnosis not present

## 2020-05-04 DIAGNOSIS — R739 Hyperglycemia, unspecified: Secondary | ICD-10-CM | POA: Diagnosis not present

## 2020-05-04 DIAGNOSIS — I10 Essential (primary) hypertension: Secondary | ICD-10-CM | POA: Diagnosis not present

## 2020-05-12 ENCOUNTER — Encounter: Payer: Self-pay | Admitting: Nutrition

## 2020-05-12 ENCOUNTER — Encounter: Payer: Medicare Other | Attending: Physician Assistant | Admitting: Nutrition

## 2020-05-12 ENCOUNTER — Other Ambulatory Visit: Payer: Self-pay

## 2020-05-12 DIAGNOSIS — Z6841 Body Mass Index (BMI) 40.0 and over, adult: Secondary | ICD-10-CM | POA: Insufficient documentation

## 2020-05-12 DIAGNOSIS — R7303 Prediabetes: Secondary | ICD-10-CM | POA: Insufficient documentation

## 2020-05-12 DIAGNOSIS — R739 Hyperglycemia, unspecified: Secondary | ICD-10-CM | POA: Insufficient documentation

## 2020-05-12 DIAGNOSIS — E669 Obesity, unspecified: Secondary | ICD-10-CM

## 2020-05-12 DIAGNOSIS — E782 Mixed hyperlipidemia: Secondary | ICD-10-CM | POA: Insufficient documentation

## 2020-05-12 DIAGNOSIS — I10 Essential (primary) hypertension: Secondary | ICD-10-CM | POA: Insufficient documentation

## 2020-05-12 NOTE — Patient Instructions (Addendum)
Goals   Eat breakfast daily. Increase lower carb vegetables. Drink only water Increase walking to mailbox. Use Mrs. Dash Cut out snacks.

## 2020-05-12 NOTE — Progress Notes (Addendum)
PHONE visit.  Medical Nutrition Therapy:  Appt start time: 0850end time: 0905 Assessment:  Primary concerns today: Obesity. PreDM. Currently on oxygen 24/7. Still seeing a therapist for his depression. Goes to see Dr. Nadara Mustard tomorrow for follow up. Trying to work on eating better. Limited mobility due to oxygen. Trying to cut out snack. Still drinks diet sodas. Trying to walk to his mailbox and get outside for fresh air some. Skips breakfast usually. Has a PCA that assists him.  Hepatic Function Latest Ref Rng & Units 04/02/2011 04/01/2011 03/31/2011  Total Protein 6.0 - 8.3 g/dL 7.1 6.0 6.7  Albumin 3.5 - 5.2 g/dL 3.8 3.5 4.0  AST 0 - 37 U/L _0 ALT 0 - 53 U/L _1 Alk Phosphatase 39 - 117 U/L 85 88 93  Total Bilirubin 0.3 - 1.2 mg/dL 0.6 0.6 0.5   .   Preferred Learning Style:  No preference indicated   Learning Readiness:  Ready  Change in progress   MEDICATIONS:  See list   DIETARY INTAKE:   24-hr recall:  Breakfast:  Skips Baked chicken, peas and sweet potato, Diet sprite Dinner: Same as lunch,    Estimated energy needs: 1800 calories 200  g carbohydrates  135g protein  50 g fat  Progress Towards Goal(s):  In progress.   Nutritional Diagnosis:  NB-1.1 Food and nutrition-related knowledge deficit As related to Morivd obesity.  As evidenced by 25 lb wt gain and BMI 50. NI-1.7 Predicted excessive energy intake As related to OBesity.  As evidenced by BMI 50.    Intervention:  Nutrition and prediabetes education, weight loss  on My Plate, CHO counting, meal planning, portion sizes, timing of meals, avoiding snacks between meals  taking medications as prescribed, benefits of exercising 30 minutes per day and prevention of DM.  Goals   Eat breakfast daily. Increase lower carb vegetables. Drink only water Increase walking to mailbox. Use Mrs. Dash Cut out snacks.   Teaching Method Utilized:  Visual Auditory Hands on  Handouts given during  visit include:  The Plate Method   Diabetes Instructions   Barriers to learning/adherence to lifestyle change: 3rd grade education  Demonstrated degree of understanding via:  Teach Back   Monitoring/Evaluation:  Dietary intake, exercise, , and body weight in 3 month(s). Need to follow up on predm labs.

## 2020-05-13 DIAGNOSIS — J449 Chronic obstructive pulmonary disease, unspecified: Secondary | ICD-10-CM | POA: Diagnosis not present

## 2020-05-13 DIAGNOSIS — I10 Essential (primary) hypertension: Secondary | ICD-10-CM | POA: Diagnosis not present

## 2020-05-13 DIAGNOSIS — Z0001 Encounter for general adult medical examination with abnormal findings: Secondary | ICD-10-CM | POA: Diagnosis not present

## 2020-05-13 DIAGNOSIS — Z23 Encounter for immunization: Secondary | ICD-10-CM | POA: Diagnosis not present

## 2020-05-13 DIAGNOSIS — N200 Calculus of kidney: Secondary | ICD-10-CM | POA: Diagnosis not present

## 2020-05-30 DIAGNOSIS — I2510 Atherosclerotic heart disease of native coronary artery without angina pectoris: Secondary | ICD-10-CM | POA: Diagnosis not present

## 2020-05-30 DIAGNOSIS — J441 Chronic obstructive pulmonary disease with (acute) exacerbation: Secondary | ICD-10-CM | POA: Diagnosis not present

## 2020-05-30 DIAGNOSIS — I10 Essential (primary) hypertension: Secondary | ICD-10-CM | POA: Diagnosis not present

## 2020-05-30 DIAGNOSIS — Z87891 Personal history of nicotine dependence: Secondary | ICD-10-CM | POA: Diagnosis not present

## 2020-06-01 DIAGNOSIS — J411 Mucopurulent chronic bronchitis: Secondary | ICD-10-CM | POA: Diagnosis not present

## 2020-06-01 DIAGNOSIS — J449 Chronic obstructive pulmonary disease, unspecified: Secondary | ICD-10-CM | POA: Diagnosis not present

## 2020-06-26 DIAGNOSIS — H5213 Myopia, bilateral: Secondary | ICD-10-CM | POA: Diagnosis not present

## 2020-06-26 DIAGNOSIS — E119 Type 2 diabetes mellitus without complications: Secondary | ICD-10-CM | POA: Diagnosis not present

## 2020-06-26 DIAGNOSIS — H2510 Age-related nuclear cataract, unspecified eye: Secondary | ICD-10-CM | POA: Diagnosis not present

## 2020-06-26 DIAGNOSIS — H524 Presbyopia: Secondary | ICD-10-CM | POA: Diagnosis not present

## 2020-06-30 DIAGNOSIS — Z87891 Personal history of nicotine dependence: Secondary | ICD-10-CM | POA: Diagnosis not present

## 2020-06-30 DIAGNOSIS — I10 Essential (primary) hypertension: Secondary | ICD-10-CM | POA: Diagnosis not present

## 2020-06-30 DIAGNOSIS — I2510 Atherosclerotic heart disease of native coronary artery without angina pectoris: Secondary | ICD-10-CM | POA: Diagnosis not present

## 2020-07-04 DIAGNOSIS — R0902 Hypoxemia: Secondary | ICD-10-CM | POA: Diagnosis not present

## 2020-07-04 DIAGNOSIS — I10 Essential (primary) hypertension: Secondary | ICD-10-CM | POA: Diagnosis not present

## 2020-07-04 DIAGNOSIS — R079 Chest pain, unspecified: Secondary | ICD-10-CM | POA: Diagnosis not present

## 2020-07-04 DIAGNOSIS — R0789 Other chest pain: Secondary | ICD-10-CM | POA: Diagnosis not present

## 2020-07-06 DIAGNOSIS — S8392XA Sprain of unspecified site of left knee, initial encounter: Secondary | ICD-10-CM | POA: Diagnosis not present

## 2020-07-06 DIAGNOSIS — M25562 Pain in left knee: Secondary | ICD-10-CM | POA: Diagnosis not present

## 2020-07-10 DIAGNOSIS — R5381 Other malaise: Secondary | ICD-10-CM | POA: Diagnosis not present

## 2020-07-10 DIAGNOSIS — R531 Weakness: Secondary | ICD-10-CM | POA: Diagnosis not present

## 2020-07-13 IMAGING — DX DG ABDOMEN 1V
1 series · 1 of 1 positions shown · non-contrast
Comparison: April 17, 2018

CLINICAL DATA: Renal stones.  Right flank pain.

EXAM:
ABDOMEN - 1 VIEW

[abdomen kub]
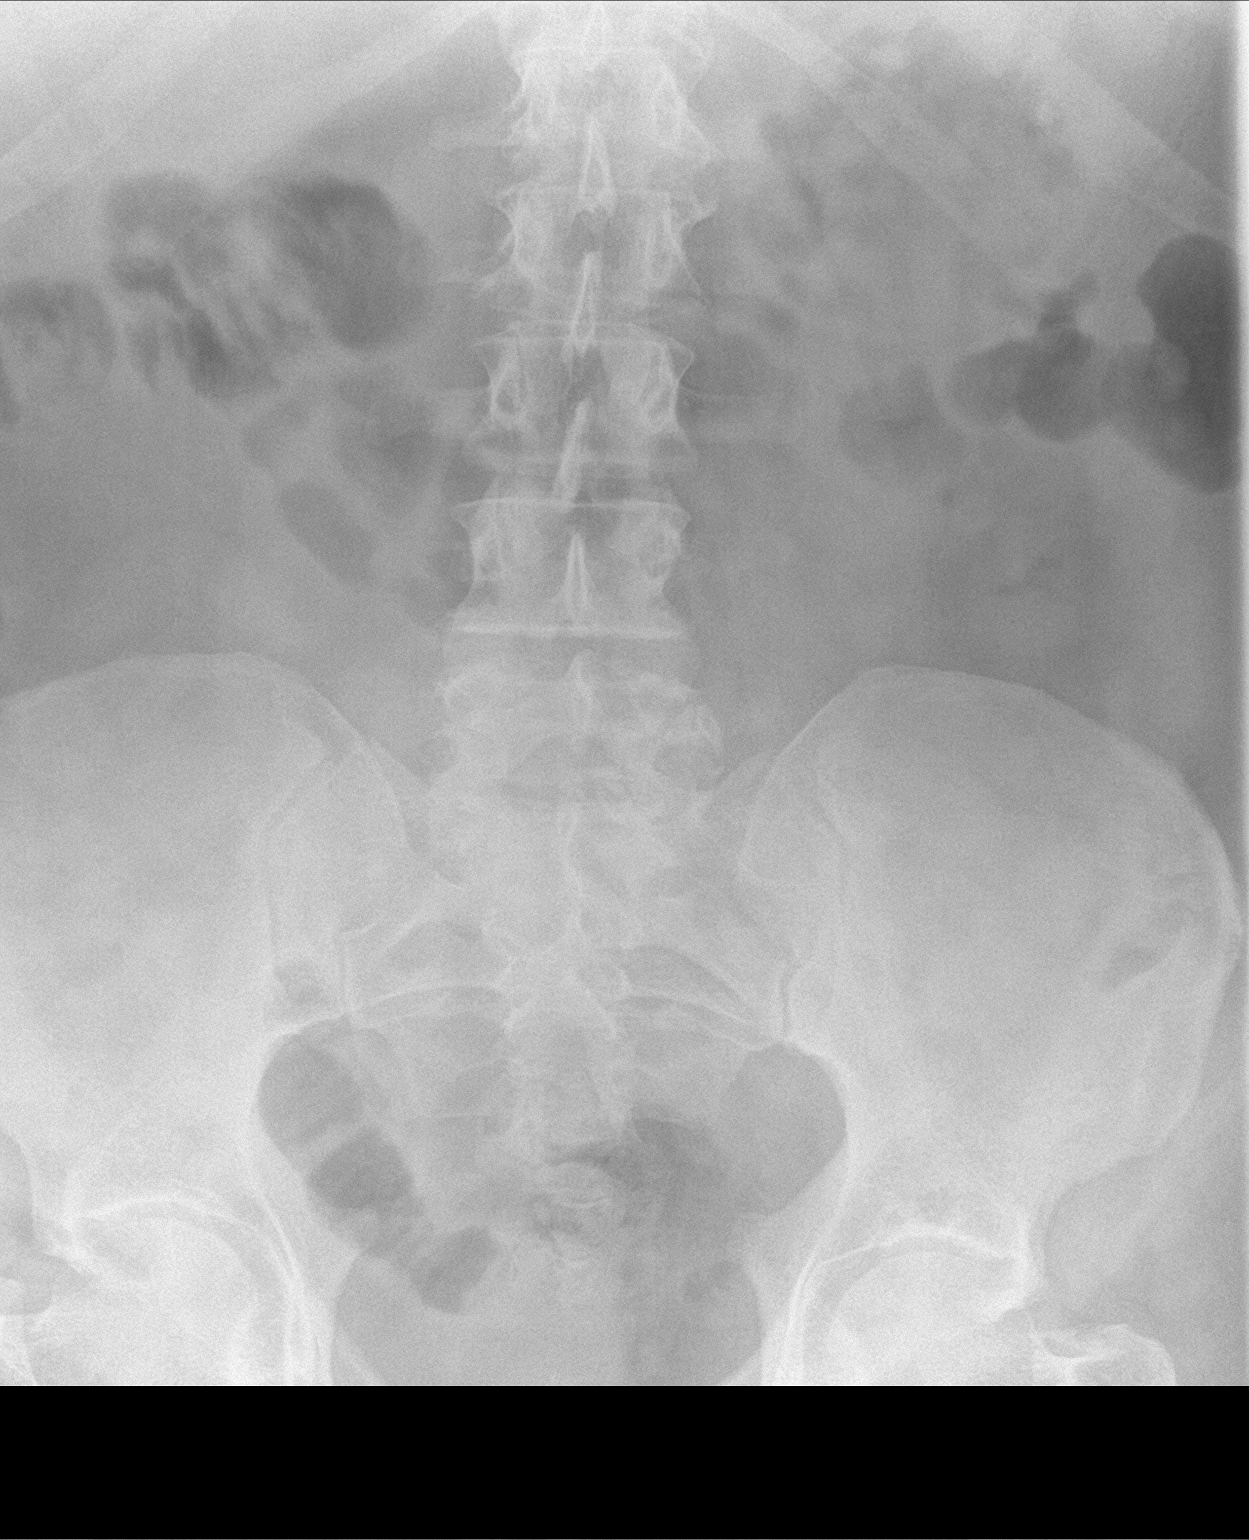

[1 of 1 positions shown; findings below may reference images not displayed]

FINDINGS: Both kidneys are obscured by bowel contents. No renal or ureteral
stones seen within this limitation.
IMPRESSION: Both kidneys are obscured by bowel contents. No renal or ureteral
stones noted.

## 2020-07-19 ENCOUNTER — Other Ambulatory Visit: Payer: Self-pay | Admitting: Physician Assistant

## 2020-07-20 DIAGNOSIS — M25562 Pain in left knee: Secondary | ICD-10-CM | POA: Diagnosis not present

## 2020-07-22 DIAGNOSIS — J449 Chronic obstructive pulmonary disease, unspecified: Secondary | ICD-10-CM | POA: Diagnosis not present

## 2020-07-22 DIAGNOSIS — J411 Mucopurulent chronic bronchitis: Secondary | ICD-10-CM | POA: Diagnosis not present

## 2020-07-27 IMAGING — DX DG ABDOMEN 1V
4 series · 4 of 4 positions shown · non-contrast
Comparison: 06/13/2018, CT 03/03/2018

CLINICAL DATA: Kidney stone

EXAM:
ABDOMEN - 1 VIEW

[abdomen kub (1 of 4)]
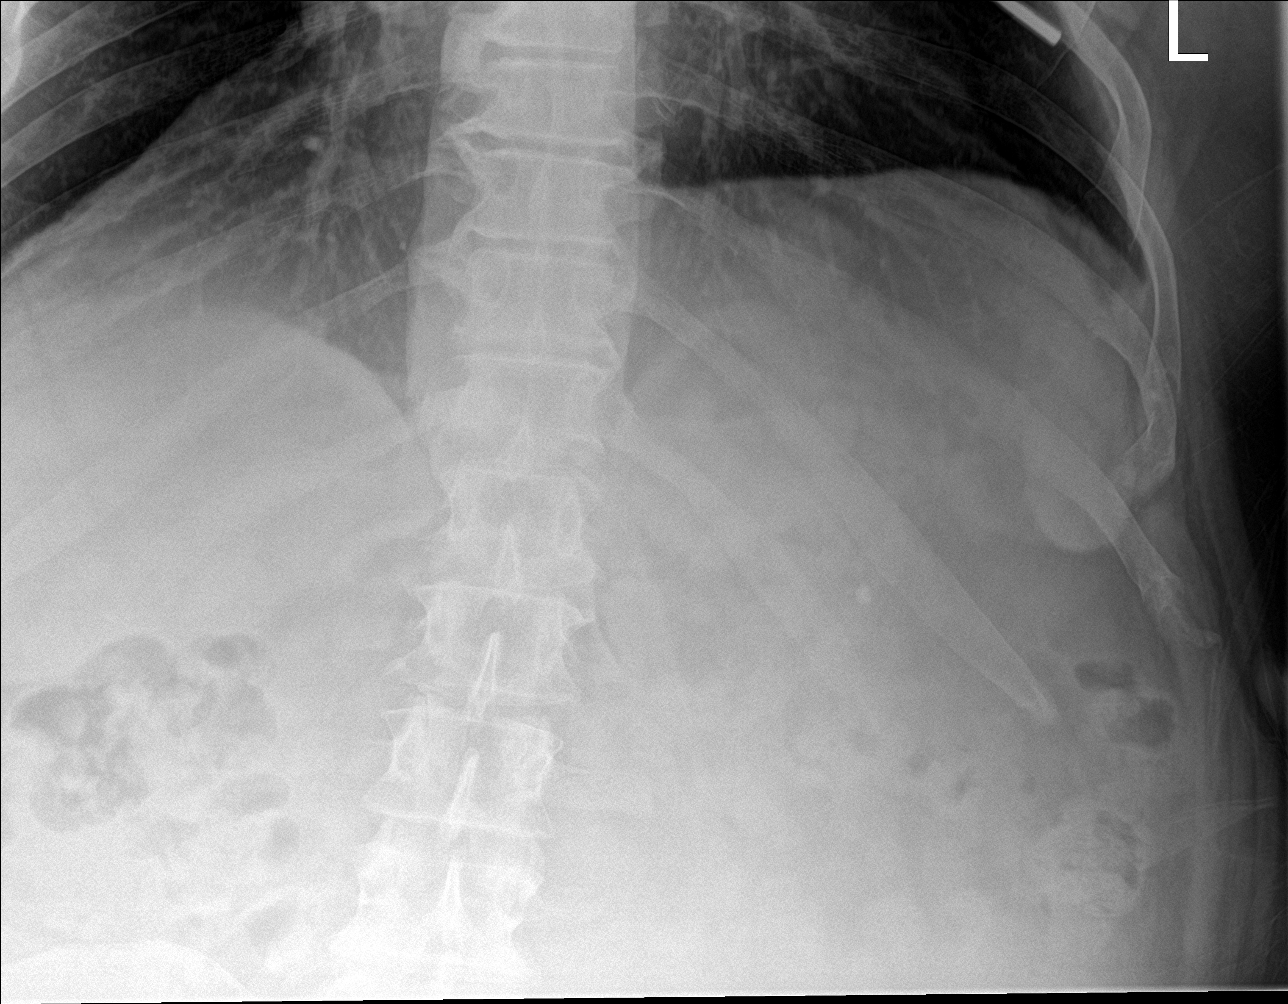

[abdomen kub (2 of 4)]
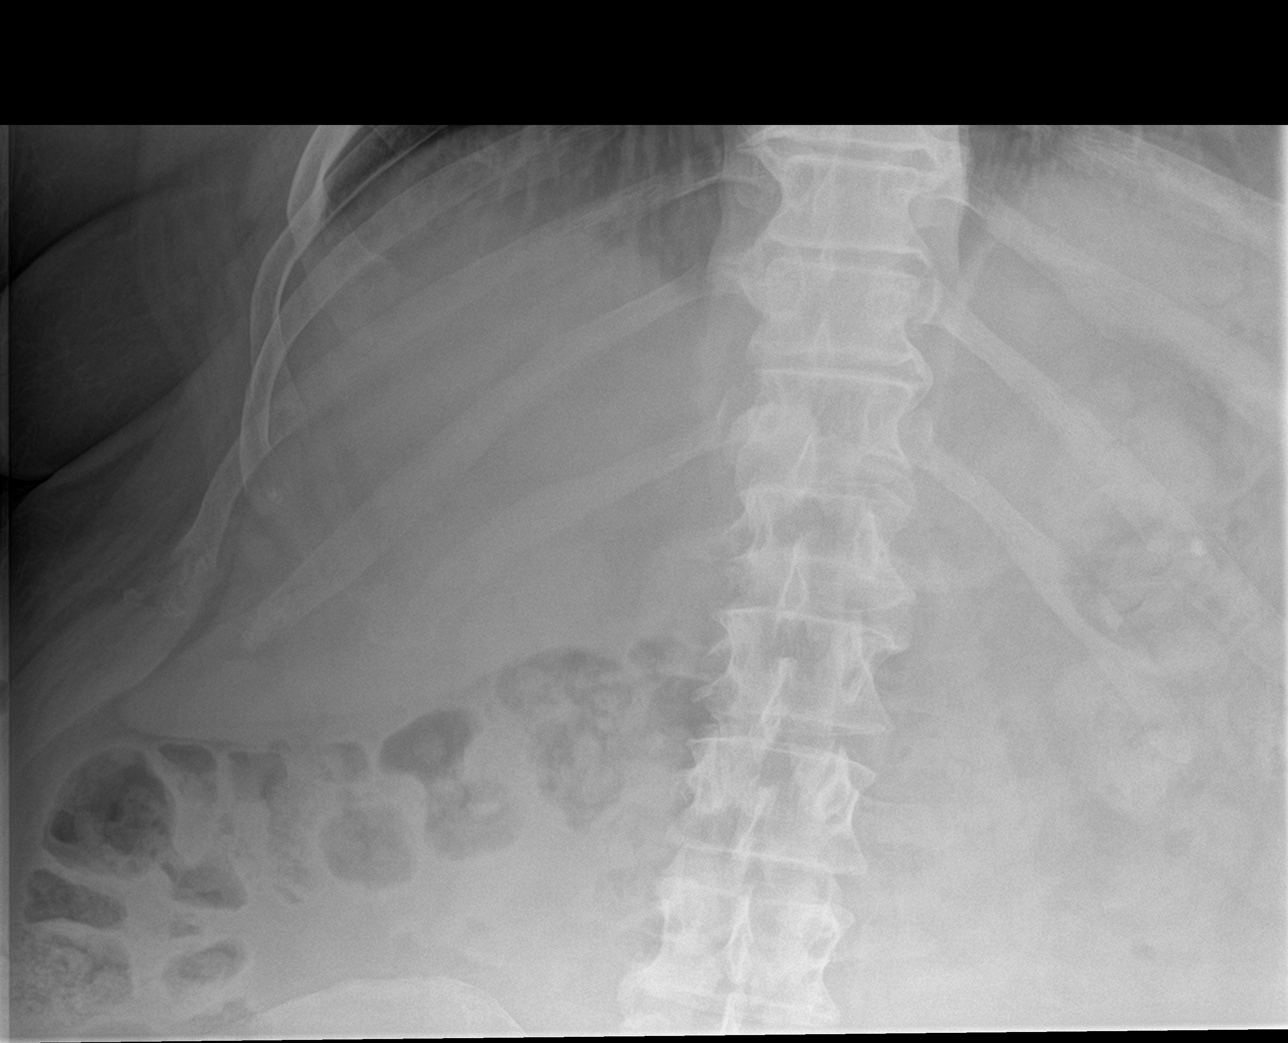

[abdomen kub (3 of 4)]
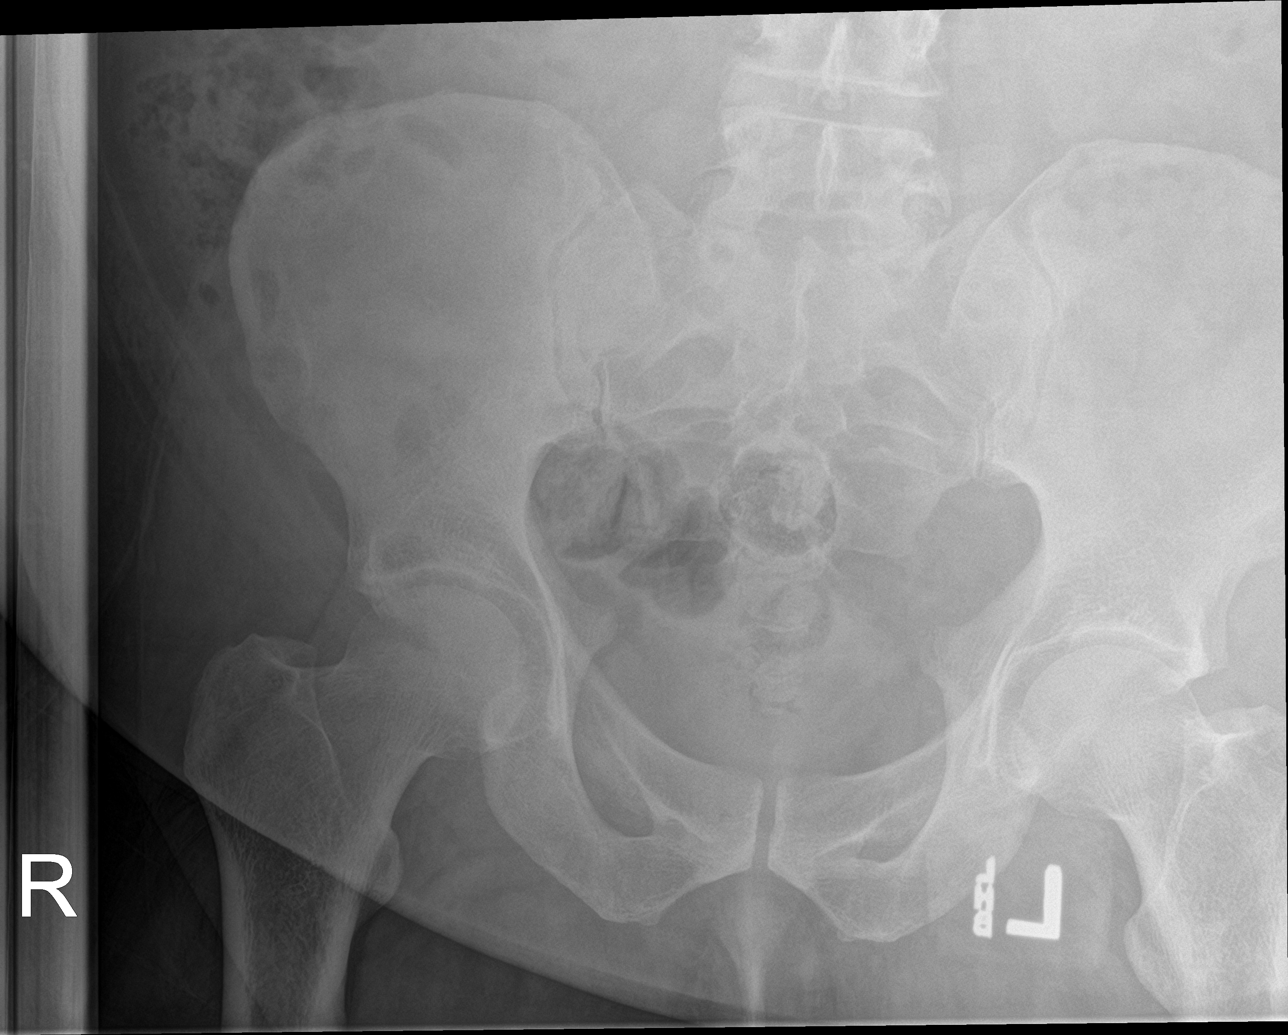

[abdomen kub (4 of 4)]
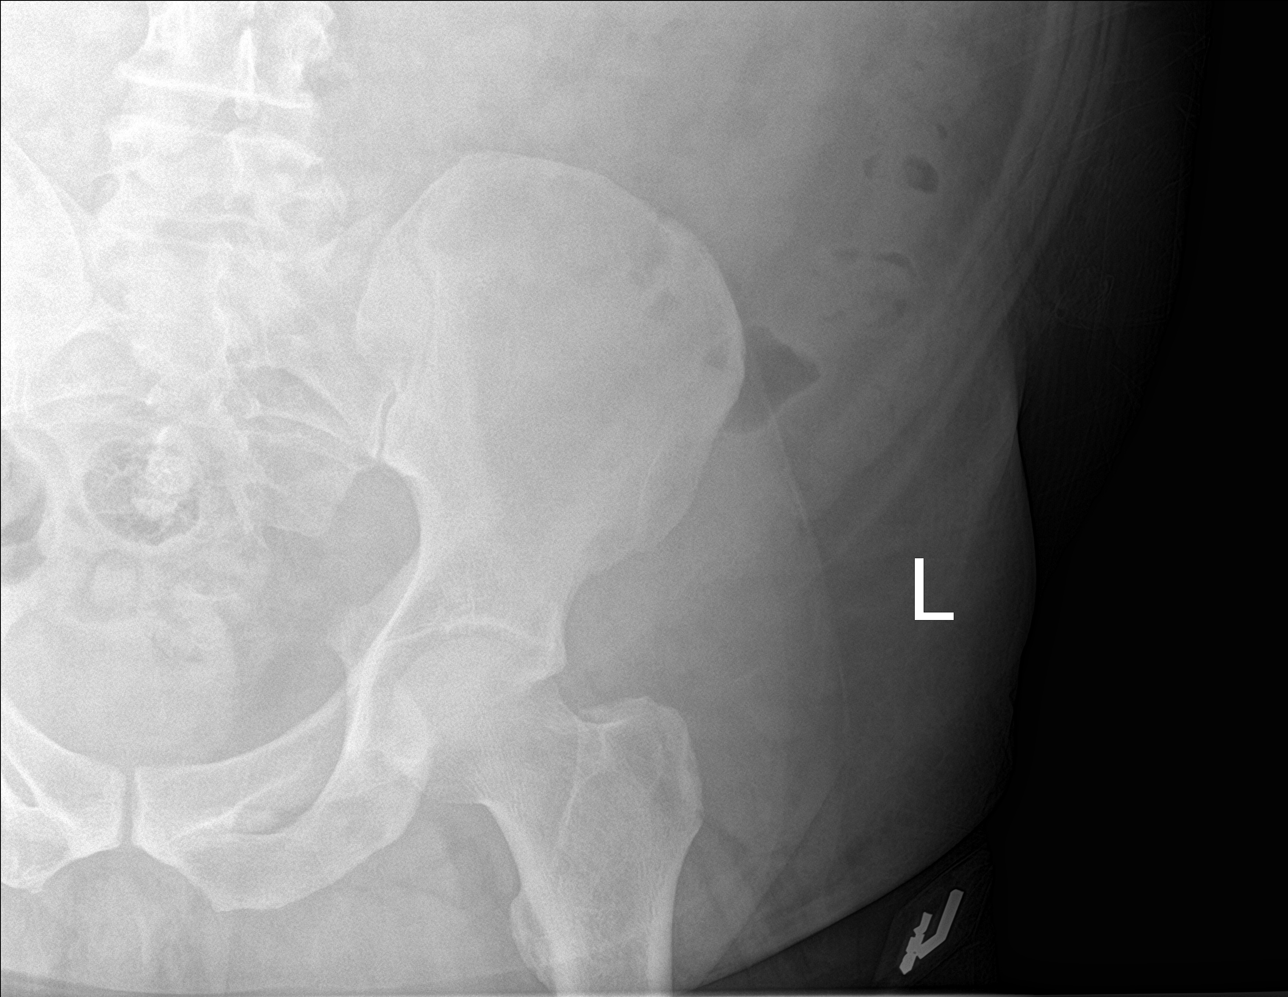

[4 of 4 positions shown; findings below may reference images not displayed]

FINDINGS: 5 mm stone projecting over the expected location of left kidney. No
definitive calcifications over right kidney or within the pelvis.
Nonobstructed gas pattern
IMPRESSION: 1. Nonobstructed gas pattern.
2. 5 mm stone projecting over the left kidney.

## 2020-07-31 DIAGNOSIS — Z87891 Personal history of nicotine dependence: Secondary | ICD-10-CM | POA: Diagnosis not present

## 2020-07-31 DIAGNOSIS — I10 Essential (primary) hypertension: Secondary | ICD-10-CM | POA: Diagnosis not present

## 2020-07-31 DIAGNOSIS — I2510 Atherosclerotic heart disease of native coronary artery without angina pectoris: Secondary | ICD-10-CM | POA: Diagnosis not present

## 2020-08-04 ENCOUNTER — Ambulatory Visit: Payer: Medicare Other | Admitting: Cardiology

## 2020-08-05 NOTE — Progress Notes (Unsigned)
Cardiology Office Note  Date: 08/06/2020   ID: Charles Mathews, DOB 12-04-1960, MRN [id number]  PCP:  Patient, No Pcp Per  Cardiologist:  Kate Sable, MD (Inactive) Electrophysiologist:  None   Chief Complaint: Cardiac follow-up  History of Present Illness: Charles Mathews is a 60 y.o. male with a history of chronic diastolic heart failure, history of chest pain, HTN, obesity.  Last encounter with Estella Husk, PA-C via telemedicine on 07/03/2019.  He complained of palpitations starting a couple weeks prior to the visit.  Stated when the skipped beats occurred he became dizzy and short of breath.  He stated he had experienced these palpitations for a long time and was used to it.  Stated they usually last about 15 to 20 minutes.  BP was elevated but had not taken hs Metoprolol. He was advised 2 Gram sodium diet. Had been out of Simvastatin since January and had recently restarted. He was referred to weight loss center for obesity.   He is here today for 1 year follow-up.  Last seen via telemedicine 07/03/2019.  He is here with his CNA who is his caregiver.  He states he has been having some increasing shortness of breath along with some chest pains with exertion.  He has COPD and has morbid obesity with OSA noncompliant with CPAP.  Nuclear stress test which noted evidence of a right lower lobe mass 3.5 cm which recommended a follow-up CT scan which apparently has never been performed.  His caregiver states he is not very active on a daily basis.  He wears home O2.  O2 sat on room air today was 88%.  His EKG today shows normal sinus rhythm with a rate of 63.  Denies any CVA or TIA like symptoms, orthostatic symptoms.  States he underwent an incomplete imaging to study on his right leg yesterday but was unable to be completed because he was unable to remain still during the imaging.  His caregiver states primary care provider has told him he needs to lose a significant amount of weight to help  with his breathing issues.  She states yesterday when he was not wearing his oxygen his saturations were in the 70s.  Denies any recent weight gain, or lower extremity edema.  No current complaints of palpitations or arrhythmias.   Past Medical History:  Diagnosis Date  . Anxiety   . Arthritis   . Asthma    Albuterol prn  . Chronic back pain   . Chronic kidney disease    kidney stones  . Coronary artery disease    11/01/12 he denies known CAD/MI/CHF history  . Diabetes mellitus    borderline  . Gastric ulcer   . GERD (gastroesophageal reflux disease)    takes Prilosec daily  . High cholesterol    takes zocor daily  . Hypertension    takes metoprolol daily and cardura  . Insomnia   . Joint pain   . Lung mass   . Major depression    takes Zoloft and effexor daily  . Migraine    last one couple nights ago and takes Topamax daily  . Morbid obesity ([place])   . Neuropathy   . Seizures ([place])    last one a couple of months ago;takes cogentin daily  . Shortness of breath    with exertion  . Urinary frequency   . Urinary urgency     Past Surgical History:  Procedure Laterality Date  . LITHOTRIPSY  2009  .  MULTIPLE EXTRACTIONS WITH ALVEOLOPLASTY N/A 12/17/2012   Procedure: MULTIPLE EXTRACTION WITH ALVEOLOPLASTY;  Surgeon: Gae Bon, DDS;  Location: [facility];  Service: Oral Surgery;  Laterality: N/A;    Current Outpatient Medications  Medication Sig Dispense Refill  . ACCU-CHEK GUIDE test strip USE ONCE DAILY TO TEST SUGAR    . Accu-Chek Softclix Lancets lancets USE TO TEST ONCE DAILY    . albuterol (VENTOLIN HFA) 108 (90 Base) MCG/ACT inhaler INHALE TWO PUFFS BY MOUTH FOUR TIMES DAILY    . ALBUTEROL IN Inhale into the lungs.    Marland Kitchen aspirin EC 81 MG tablet Take 81 mg by mouth daily.    . benztropine (COGENTIN) 1 MG tablet Take one tablet at night    . Blood Glucose Monitoring Suppl (ACCU-CHEK GUIDE) w/Device KIT USE ONCE DAILY TO TEST SUAGR    . doxazosin (CARDURA) 2 MG tablet  TAKE 1 TABLET BY MOUTH DAILY 30 tablet 6  . furosemide (LASIX) 40 MG tablet TAKE 1 TABLET BY MOUTH DAILY 30 tablet 6  . metoprolol tartrate (LOPRESSOR) 50 MG tablet TAKE 1 TABLET BY MOUTH TWICE DAILY 60 tablet 6  . omeprazole (PRILOSEC) 20 MG capsule Take 20 mg by mouth every morning.    . paliperidone (INVEGA) 3 MG 24 hr tablet Take one (1) tablet by mouth every morning    . potassium chloride SA (KLOR-CON) 20 MEQ tablet TAKE 1 TABLET BY MOUTH DAILY 30 tablet 6  . sertraline (ZOLOFT) 100 MG tablet Take 200 mg by mouth daily.    . simvastatin (ZOCOR) 40 MG tablet TAKE 1 TABLET BY MOUTH DAILY 30 tablet 6  . topiramate (TOPAMAX) 50 MG tablet Take 50 mg by mouth 2 (two) times daily.    . traZODone (DESYREL) 100 MG tablet Take one (1) tablet by mouth at bedtime, as needed     No current facility-administered medications for this visit.   Allergies:  Bee venom, Benadryl [diphenhydramine], Lorazepam, Lorazepam, and Penicillins   Social History: The patient  reports that he quit smoking about 18 years ago. His smoking use included cigarettes. He has a 54.00 pack-year smoking history. He has never used smokeless tobacco. He reports that he does not drink alcohol and does not use drugs.   Family History: The patient's family history is not on file. He was adopted.   ROS:  Please see the history of present illness. Otherwise, complete review of systems is positive for none.  All other systems are reviewed and negative.   Physical Exam: VS:  BP (!) 146/70   Pulse 64   Ht 5' 9" (1.753 m)   Wt (!) 334 lb (151.5 kg)   SpO2 (!) 88%   BMI 49.32 kg/m , BMI Body mass index is 49.32 kg/m.  Wt Readings from Last 3 Encounters:  08/06/20 (!) 334 lb (151.5 kg)  02/12/20 (!) 336 lb (152.4 kg)  01/28/20 (!) 336 lb (152.4 kg)    General: Morbidly obese patient appears comfortable at rest. Neck: Supple, no elevated JVP or carotid bruits, no thyromegaly. Lungs: Clear to auscultation, nonlabored breathing  at rest. Cardiac: Regular rate and rhythm, no S3 or significant systolic murmur, no pericardial rub. Extremities: No pitting edema, distal pulses 2+. Skin: Warm and dry. Musculoskeletal: No kyphosis. Neuropsychiatric: Alert and oriented x3, affect grossly appropriate.  ECG:  An ECG dated 08/06/2020 was personally reviewed today and demonstrated:  Normal sinus rhythm rate of 63.  Recent Labwork: No results found for requested labs within last 8760  hours.     Component Value Date/Time   CHOL 135 04/02/2011 0550   TRIG 84 04/02/2011 0550   HDL 40 04/02/2011 0550   CHOLHDL 3.4 04/02/2011 0550   VLDL 17 04/02/2011 0550   LDLCALC 78 04/02/2011 0550    Other Studies Reviewed Today:  NST 06/2018 [facility] CONCLUSION: 1.) LIMITATIONS: None. 2.) MYOCARDIAL PERFUSION: No definite evidence for a transmural scar or significant inducible transmural ischemia  3.) LEFT VENTRICULAR EJECTION FRACTION: Normal. 4.) REGIONAL WALL MOTION: Normal. 5.) Incidental note is made of right lower lobe pulmonary mass subpleurally measuring up to 3.5 cm. Recommended contrast enhanced CT chest for further evaluation  2D echo 11/2019FINDINGS:  LEFT VENTRICLE There is normal left ventricular wall thickness. The left ventricular size is  normal. Left ventricular systolic function is normal. LV ejection fraction =  55%. Left ventricular filling pattern is normal. The left ventricular wall  motion is normal. -  RIGHT VENTRICLE The right ventricle is normal in size and function.  LEFT ATRIUM The left atrium is mildly dilated.  RIGHT ATRIUM  Right atrial size is normal. - AORTIC VALVE Diffuse thickening of the aortic valve with preserved cusp opening. There is  no aortic stenosis. There is no aortic regurgitation. - MITRAL VALVE There is mild mitral annular calcification. There is mild mitral  regurgitation. - TRICUSPID VALVE There is mild tricuspid valve thickening. There is mild  tricuspid  regurgitation. No pulmonary hypertension. - PULMONIC VALVE The pulmonic valve is not well visualized. There is no pulmonic valvular  regurgitation. There is no pulmonic valvular stenosis. - ARTERIES The aortic root is normal size. - VENOUS Pulmonary venous flow pattern is blunted. IVC size was mildly dilated. - EFFUSION There is no pericardial effusion. - - MMode/2D Measurements & Calculations IVSd: 1.1 cm LVIDd: 5.6 cm LVPWd: 1.1 cm LVIDs: 4.7 cm LA diam: 4.0 cm EDV(MOD-sp4): 159.0 ml ESV(MOD-sp4): 79.1 ml EDV(MOD-sp2): 150.6 ml ESV(MOD-sp2): 65.6 ml Ao sinus diam: 2.9 cm asc Aorta Diam: 3.0 cm LVOT diam: 2.0 cm SV(MOD-sp4): 79.9 ml SI(MOD-sp4): 32.0 ml/m2 IVC 1: 2.0 cm LA area A2: 23.8 cm2 LA area A4: 24.6 cm2 LA vol: 77.9 ml LA vol index: 31.2 ml/m2 RA area A4: 23.3 cm2 Doppler Measurements & Calculations MV E max vel: 135.7 cm/sec MV A max vel: 101.8 cm/sec MV E/A: 1.3 Med Peak E' Vel: 7.3 cm/sec Lat Peak E' Vel: 13.1 cm/sec E/Lat E`: 10.4 E/Med E`: 18.6 MV dec time: 0.18 sec SV(LVOT): 106.7 ml Ao V2 max: 220.0 cm/sec Ao max PG: 19.4 mmHg Ao V2 mean: 135.5 cm/sec Ao mean PG: 8.5 mmHg Ao V2 VTI: 46.1 cm AVA (VTI): 2.3 cm2 LV V1 VTI: 33.7 cm TR max vel: 188.8 cm/sec TR max PG: 14.3 mmHg RVSP(TR): 24.3 mmHg RAP systole: 10.0 mmHg AS Dimensionless Index (VTI): 0.73 AVAi(VTI) cm^2/m^2: 0.93 cm2 SV index(LVOT): 42.8 ml/m2   Reading Physician: [place], [id number] 06/18/2018 02:42 PM   Assessment and Plan:  1. Palpitations   2. Chronic diastolic heart failure ([place])   3. Essential hypertension   4. Mixed hyperlipidemia   5. Chest pain, unspecified type   6. DOE (dyspnea on exertion)   7. Lung mass    1. Palpitations Denies any recent palpitations.  EKG today shows normal sinus rhythm with a rate of 63.  Continue metoprolol 50 mg p.o. twice daily.  2. Chronic diastolic heart failure ([facility]) No recent weight gain or lower extremity  edema.  Does complain of shortness of breath with  exertion.  Continue furosemide 40 mg daily.  Metoprolol 50 mg p.o. twice daily.  Potassium supplementation.  3. Essential hypertension Blood pressure elevated today on arrival.  Previous visit blood pressure was within normal limits.  Continue metoprolol 50 mg p.o. twice daily.  Continue Lasix 40 mg daily.  4. Mixed hyperlipidemia Continue simvastatin 40 mg daily.  5. Chest pain, unspecified type Complaining of chest pain with exertion with radiation to neck.  Denies any associated nausea, vomiting, or diaphoresis.  Please get a Lexiscan stress test.  6. DOE (dyspnea on exertion) Complaining of increased dyspnea on exertion.  Has a normal systolic cardiac murmur.  Last echo November 2019 showed EF of 55%.  Mild MR, mild TR. please get a follow-up echocardiogram.  7. Lung mass Last nuclear stress test showed evidence of 3.5 cm subpleural lung mass which recommended a CT scan with contrast to evaluate the mass.  Apparently this has not been followed.  Please get a chest CT with contrast to evaluate lung mass.   Medication Adjustments/Labs and Tests Ordered: Current medicines are reviewed at length with the patient today.  Concerns regarding medicines are outlined above.   Disposition: Follow-up with Dr. Harl Bowie or APP 4 weeks.  Signed, Levell July, NP 08/06/2020 10:40 AM    [facility] [place], [place], [place] [address] Phone: [phone]; Fax: [phone]

## 2020-08-06 ENCOUNTER — Ambulatory Visit (INDEPENDENT_AMBULATORY_CARE_PROVIDER_SITE_OTHER): Payer: Medicare Other | Admitting: Family Medicine

## 2020-08-06 ENCOUNTER — Encounter: Payer: Self-pay | Admitting: Family Medicine

## 2020-08-06 ENCOUNTER — Encounter: Payer: Self-pay | Admitting: *Deleted

## 2020-08-06 VITALS — BP 146/70 | HR 64 | Ht 69.0 in | Wt 334.0 lb

## 2020-08-06 DIAGNOSIS — I10 Essential (primary) hypertension: Secondary | ICD-10-CM | POA: Diagnosis not present

## 2020-08-06 DIAGNOSIS — I5032 Chronic diastolic (congestive) heart failure: Secondary | ICD-10-CM | POA: Diagnosis not present

## 2020-08-06 DIAGNOSIS — R918 Other nonspecific abnormal finding of lung field: Secondary | ICD-10-CM | POA: Diagnosis not present

## 2020-08-06 DIAGNOSIS — R0600 Dyspnea, unspecified: Secondary | ICD-10-CM

## 2020-08-06 DIAGNOSIS — R0609 Other forms of dyspnea: Secondary | ICD-10-CM

## 2020-08-06 DIAGNOSIS — R002 Palpitations: Secondary | ICD-10-CM

## 2020-08-06 DIAGNOSIS — E782 Mixed hyperlipidemia: Secondary | ICD-10-CM

## 2020-08-06 DIAGNOSIS — R079 Chest pain, unspecified: Secondary | ICD-10-CM | POA: Diagnosis not present

## 2020-08-06 NOTE — Patient Instructions (Signed)
Medication Instructions:  Your physician recommends that you continue on your current medications as directed. Please refer to the Current Medication list given to you today.  *If you need a refill on your cardiac medications before your next appointment, please call your pharmacy*   Lab Work: NONE   If you have labs (blood work) drawn today and your tests are completely normal, you will receive your results only by: Marland Kitchen MyChart Message (if you have MyChart) OR . A paper copy in the mail If you have any lab test that is abnormal or we need to change your treatment, we will call you to review the results.   Testing/Procedures: Your physician has requested that you have an echocardiogram. Echocardiography is a painless test that uses sound waves to create images of your heart. It provides your doctor with information about the size and shape of your heart and how well your heart's chambers and valves are working. This procedure takes approximately one hour. There are no restrictions for this procedure.  Your physician has requested that you have a lexiscan myoview. For further information please visit [email or web address] Please follow instruction sheet, as given.  Non-Cardiac CT scanning, (CAT scanning), is a noninvasive, special x-ray that produces cross-sectional images of the body using x-rays and a computer. CT scans help physicians diagnose and treat medical conditions. For some CT exams, a contrast material is used to enhance visibility in the area of the body being studied. CT scans provide greater clarity and reveal more details than regular x-ray exams.   Follow-Up: At [facility], you and your health needs are our priority.  As part of our continuing mission to provide you with exceptional heart care, we have created designated Provider Care Teams.  These Care Teams include your primary Cardiologist (physician) and Advanced Practice Providers (APPs -  Physician Assistants and Nurse  Practitioners) who all work together to provide you with the care you need, when you need it.  We recommend signing up for the patient portal called "MyChart".  Sign up information is provided on this After Visit Summary.  MyChart is used to connect with patients for Virtual Visits (Telemedicine).  Patients are able to view lab/test results, encounter notes, upcoming appointments, etc.  Non-urgent messages can be sent to your provider as well.   To learn more about what you can do with MyChart, go to [email or web address].    Your next appointment:   1 month(s)  The format for your next appointment:   In Person  Provider:   Rennis Harding, NP    Other Instructions Thank you for choosing [facility]!

## 2020-08-07 NOTE — Addendum Note (Signed)
Addended by: Levonne Hubert on: 08/07/2020 10:04 AM   Modules accepted: Orders

## 2020-08-13 ENCOUNTER — Ambulatory Visit: Payer: Medicare Other | Admitting: Urology

## 2020-08-13 DIAGNOSIS — N200 Calculus of kidney: Secondary | ICD-10-CM

## 2020-08-16 ENCOUNTER — Other Ambulatory Visit: Payer: Self-pay | Admitting: Cardiology

## 2020-08-21 DIAGNOSIS — H524 Presbyopia: Secondary | ICD-10-CM | POA: Diagnosis not present

## 2020-08-21 DIAGNOSIS — H52223 Regular astigmatism, bilateral: Secondary | ICD-10-CM | POA: Diagnosis not present

## 2020-08-22 DIAGNOSIS — J449 Chronic obstructive pulmonary disease, unspecified: Secondary | ICD-10-CM | POA: Diagnosis not present

## 2020-08-22 DIAGNOSIS — J411 Mucopurulent chronic bronchitis: Secondary | ICD-10-CM | POA: Diagnosis not present

## 2020-08-28 ENCOUNTER — Ambulatory Visit (HOSPITAL_COMMUNITY)
Admission: RE | Admit: 2020-08-28 | Discharge: 2020-08-28 | Disposition: A | Discharge status: Home / Self Care | Payer: Medicare Other | Source: Ambulatory Visit | Attending: Family Medicine | Admitting: Family Medicine

## 2020-08-28 ENCOUNTER — Encounter (HOSPITAL_COMMUNITY): Payer: Medicare Other

## 2020-08-28 ENCOUNTER — Ambulatory Visit (HOSPITAL_COMMUNITY): Payer: Medicare Other

## 2020-08-28 ENCOUNTER — Other Ambulatory Visit: Payer: Self-pay

## 2020-08-28 ENCOUNTER — Ambulatory Visit (HOSPITAL_BASED_OUTPATIENT_CLINIC_OR_DEPARTMENT_OTHER)
Admission: RE | Admit: 2020-08-28 | Discharge: 2020-08-28 | Disposition: A | Discharge status: Home / Self Care | Payer: Medicare Other | Source: Ambulatory Visit | Attending: Family Medicine | Admitting: Family Medicine

## 2020-08-28 DIAGNOSIS — R0609 Other forms of dyspnea: Secondary | ICD-10-CM

## 2020-08-28 DIAGNOSIS — R0600 Dyspnea, unspecified: Secondary | ICD-10-CM

## 2020-08-28 DIAGNOSIS — R918 Other nonspecific abnormal finding of lung field: Secondary | ICD-10-CM | POA: Insufficient documentation

## 2020-08-28 DIAGNOSIS — R0602 Shortness of breath: Secondary | ICD-10-CM | POA: Diagnosis not present

## 2020-08-28 LAB — ECHOCARDIOGRAM COMPLETE
AR max vel: 2.06 cm2
AV Area VTI: 2.58 cm2
AV Area mean vel: 2.3 cm2
AV Mean grad: 6 mmHg
AV Peak grad: 12.4 mmHg
Ao pk vel: 1.76 m/s
Area-P 1/2: 2.97 cm2
S' Lateral: 3.5 cm

## 2020-08-28 NOTE — Progress Notes (Signed)
*  PRELIMINARY RESULTS* Echocardiogram 2D Echocardiogram has been performed.  Charles Mathews 08/28/2020, 10:10 AM

## 2020-08-29 DIAGNOSIS — J441 Chronic obstructive pulmonary disease with (acute) exacerbation: Secondary | ICD-10-CM | POA: Diagnosis not present

## 2020-08-29 DIAGNOSIS — I10 Essential (primary) hypertension: Secondary | ICD-10-CM | POA: Diagnosis not present

## 2020-08-29 DIAGNOSIS — I2510 Atherosclerotic heart disease of native coronary artery without angina pectoris: Secondary | ICD-10-CM | POA: Diagnosis not present

## 2020-08-29 DIAGNOSIS — Z87891 Personal history of nicotine dependence: Secondary | ICD-10-CM | POA: Diagnosis not present

## 2020-09-01 ENCOUNTER — Encounter (HOSPITAL_COMMUNITY)
Admission: RE | Admit: 2020-09-01 | Discharge: 2020-09-01 | Disposition: A | Discharge status: Home / Self Care | Payer: Medicare Other | Source: Ambulatory Visit | Attending: Family Medicine | Admitting: Family Medicine

## 2020-09-01 ENCOUNTER — Encounter (HOSPITAL_COMMUNITY): Payer: Self-pay

## 2020-09-01 ENCOUNTER — Other Ambulatory Visit: Payer: Self-pay

## 2020-09-01 ENCOUNTER — Ambulatory Visit (HOSPITAL_COMMUNITY)
Admission: RE | Admit: 2020-09-01 | Discharge: 2020-09-01 | Disposition: A | Discharge status: Home / Self Care | Payer: Medicare Other | Source: Ambulatory Visit | Attending: Family Medicine | Admitting: Family Medicine

## 2020-09-01 ENCOUNTER — Telehealth: Payer: Self-pay | Admitting: *Deleted

## 2020-09-01 DIAGNOSIS — R079 Chest pain, unspecified: Secondary | ICD-10-CM | POA: Diagnosis not present

## 2020-09-01 DIAGNOSIS — Z23 Encounter for immunization: Secondary | ICD-10-CM | POA: Diagnosis not present

## 2020-09-01 LAB — NM MYOCAR MULTI W/SPECT W/WALL MOTION / EF
LV dias vol: 152 mL (ref 62–150)
LV sys vol: 73 mL
Peak HR: 74 {beats}/min
RATE: 0.36
Rest HR: 55 {beats}/min
SDS: 2
SRS: 4
SSS: 6
TID: 1.17

## 2020-09-01 MED ORDER — TECHNETIUM TC 99M TETROFOSMIN IV KIT
30.0000 | PACK | Freq: Once | INTRAVENOUS | Status: AC | PRN
  Administered 2020-09-01: 32 via INTRAVENOUS

## 2020-09-01 MED ORDER — TECHNETIUM TC 99M TETROFOSMIN IV KIT
10.0000 | PACK | Freq: Once | INTRAVENOUS | Status: AC | PRN
  Administered 2020-09-01: 11 via INTRAVENOUS

## 2020-09-01 MED ORDER — REGADENOSON 0.4 MG/5ML IV SOLN
INTRAVENOUS | Status: AC
  Administered 2020-09-01: 0.4 mg via INTRAVENOUS
  Filled 2020-09-01: qty 5

## 2020-09-01 MED ORDER — SODIUM CHLORIDE FLUSH 0.9 % IV SOLN
INTRAVENOUS | Status: AC
  Administered 2020-09-01: 10 mL via INTRAVENOUS
  Filled 2020-09-01: qty 10

## 2020-09-01 NOTE — Telephone Encounter (Signed)
ECHO -   Verta Ellen., NP  08/28/2020 12:56 PM EST      Please call the patient and let him know the echocardiogram showed he had good pumping function of his heart. Left ventricle is a little thickened and muscular. The best management for this is managing blood pressure to keep it at or below 130/80 sustained. Otherwise everything looks okay. Nothing from a functional standpoint regarding his heart that would cause increased shortness of breath on exertion.   CT OF CHEST -    Verta Ellen., NP  08/28/2020 4:24 PM EST      Please call the patient and let him know the CT of the chest showed only mild enlargement of the hamartoma (benign lung nodule) in his chest. Nothing on his chest CT to account for increased shortness of breath

## 2020-09-01 NOTE — Telephone Encounter (Signed)
Laurine Blazer, LPN  01/01/7857 8:50 PM EST Back to Top     Notified Rockney Ghee, CNA, copy to pcp   No pcp.

## 2020-09-01 NOTE — Telephone Encounter (Signed)
Laurine Blazer, LPN  08/01/9145 8:29 PM EST Back to Top     Notified Rockney Ghee, CNA, copy to pcp.    NO PCP

## 2020-09-01 NOTE — Telephone Encounter (Signed)
-----   Message from Verta Ellen., NP sent at 09/01/2020  4:57 PM EST ----- Please call the patient let him know the stress test was considered low risk and no evidence of ischemia per Dr. Myles Gip interpretation. Thank you

## 2020-09-03 NOTE — Progress Notes (Unsigned)
Cardiology Office Note  Date: 09/04/2020   ID: ASHAWN RINEHART, DOB [birth year], MRN [id number]  PCP:  [facility]  Cardiologist:  No primary care provider on file. Electrophysiologist:  None   Chief Complaint: Cardiac follow-up  History of Present Illness: Charles Mathews is a 60 y.o. male with a history of chronic diastolic heart failure, history of chest pain, HTN, obesity.  Last encounter with Charles Husk, PA-C via telemedicine on 07/03/2019.  He complained of palpitations starting a couple weeks prior to the visit.  Stated when the skipped beats occurred he became dizzy and short of breath.  He stated he had experienced these palpitations for a long time and was used to it.  Stated they usually last about 15 to 20 minutes.  BP was elevated but had not taken hs Metoprolol. He was advised 2 Gram sodium diet. Had been out of Simvastatin since January and had recently restarted. He was referred to weight loss center for obesity.   He is here today for follow-up on recent stress test and echocardiogram.  Stress test was negative for ischemia and considered low risk.  Echocardiogram demonstrated EF 55 to 60%.  No WMA's.  Mild LVH.  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  States he has lost some weight in the interim since last visit.  He denies any significant dyspnea, anginal symptoms, orthostatic symptoms, PND, orthopnea, lower extremity edema.   Past Medical History:  Diagnosis Date  . Anxiety   . Arthritis   . Asthma    Albuterol prn  . Chronic back pain   . Chronic kidney disease    kidney stones  . Coronary artery disease    11/01/12 he denies known CAD/MI/CHF history  . Diabetes mellitus    borderline  . Gastric ulcer   . GERD (gastroesophageal reflux disease)    takes Prilosec daily  . High cholesterol    takes zocor daily  .  Hypertension    takes metoprolol daily and cardura  . Insomnia   . Joint pain   . Lung mass   . Major depression    takes Zoloft and effexor daily  . Migraine    last one couple nights ago and takes Topamax daily  . Morbid obesity ([place])   . Neuropathy   . Seizures ([place])    last one a couple of months ago;takes cogentin daily  . Shortness of breath    with exertion  . Urinary frequency   . Urinary urgency     Past Surgical History:  Procedure Laterality Date  . LITHOTRIPSY  2009  . MULTIPLE EXTRACTIONS WITH ALVEOLOPLASTY N/A 12/17/2012   Procedure: MULTIPLE EXTRACTION WITH ALVEOLOPLASTY;  Surgeon: Gae Bon, DDS;  Location: [facility];  Service: Oral Surgery;  Laterality: N/A;    Current Outpatient Medications  Medication Sig Dispense Refill  . ACCU-CHEK GUIDE test strip USE ONCE DAILY TO TEST SUGAR    . Accu-Chek Softclix Lancets lancets USE TO TEST ONCE DAILY    . albuterol (VENTOLIN HFA) 108 (90 Base) MCG/ACT inhaler INHALE TWO PUFFS BY MOUTH FOUR TIMES DAILY    . ALBUTEROL IN Inhale into the lungs.    Marland Kitchen aspirin EC 81 MG tablet Take 81 mg by mouth daily.    . benztropine (COGENTIN) 1 MG tablet Take one tablet at night    .  Blood Glucose Monitoring Suppl (ACCU-CHEK GUIDE) w/Device KIT USE ONCE DAILY TO TEST SUAGR    . doxazosin (CARDURA) 2 MG tablet TAKE 1 TABLET BY MOUTH DAILY 30 tablet 6  . furosemide (LASIX) 40 MG tablet TAKE 1 TABLET BY MOUTH DAILY 90 tablet 1  . metoprolol tartrate (LOPRESSOR) 50 MG tablet TAKE 1 TABLET BY MOUTH TWICE DAILY 60 tablet 6  . omeprazole (PRILOSEC) 20 MG capsule Take 20 mg by mouth every morning.    . paliperidone (INVEGA) 3 MG 24 hr tablet Take one (1) tablet by mouth every morning    . potassium chloride SA (KLOR-CON) 20 MEQ tablet TAKE 1 TABLET BY MOUTH DAILY 90 tablet 1  . sertraline (ZOLOFT) 100 MG tablet Take 200 mg by mouth daily.    . simvastatin (ZOCOR) 40 MG tablet TAKE 1 TABLET BY MOUTH DAILY 30 tablet 6  . topiramate (TOPAMAX)  50 MG tablet Take 50 mg by mouth 2 (two) times daily.    . traZODone (DESYREL) 100 MG tablet Take one (1) tablet by mouth at bedtime, as needed     No current facility-administered medications for this visit.   Allergies:  Bee venom, Benadryl [diphenhydramine], Lorazepam, Lorazepam, and Penicillins   Social History: The patient  reports that he quit smoking about 18 years ago. His smoking use included cigarettes. He has a 54.00 pack-year smoking history. He has never used smokeless tobacco. He reports that he does not drink alcohol and does not use drugs.   Family History: The patient's family history is not on file. He was adopted.   ROS:  Please see the history of present illness. Otherwise, complete review of systems is positive for none.  All other systems are reviewed and negative.   Physical Exam: VS:  BP (!) 144/80   Pulse 62   Ht '5\' 9"'  (1.753 m)   Wt (!) 327 lb 3.2 oz (148.4 kg)   SpO2 90%   BMI 48.32 kg/m , BMI Body mass index is 48.32 kg/m.  Wt Readings from Last 3 Encounters:  09/04/20 (!) 327 lb 3.2 oz (148.4 kg)  08/06/20 (!) 334 lb (151.5 kg)  02/12/20 (!) 336 lb (152.4 kg)    General: Morbidly obese patient appears comfortable at rest. Neck: Supple, no elevated JVP or carotid bruits, no thyromegaly. Lungs: Clear to auscultation, nonlabored breathing at rest. Cardiac: Regular rate and rhythm, no S3 or significant systolic murmur, no pericardial rub. Extremities: No pitting edema, distal pulses 2+. Skin: Warm and dry. Musculoskeletal: No kyphosis. Neuropsychiatric: Alert and oriented x3, affect grossly appropriate.  ECG:  An ECG dated 08/06/2020 was personally reviewed today and demonstrated:  Normal sinus rhythm rate of 63.  Recent Labwork: No results found for requested labs within last 8760 hours.     Component Value Date/Time   CHOL 135 04/02/2011 0550   TRIG 84 04/02/2011 0550   HDL 40 04/02/2011 0550   CHOLHDL 3.4 04/02/2011 0550   VLDL 17 04/02/2011  0550   LDLCALC 78 04/02/2011 0550    Other Studies Reviewed Today:  NST 09/01/2020 Study Result  Narrative & Impression   No diagnostic ST segment changes to indicate ischemia.  Small, mild intensity, apical anteroseptal and basal inferolateral defects that are fixed and most consistent with soft tissue attenuation.  This is a low risk study.  Nuclear stress EF: 52%.     CT Chest  08/28/2020 IMPRESSION: 1.  No acute intrathoracic process. 2. Slowly enlarging pulmonary hamartoma in the superior segment  of the right lower lobe, currently measuring 4.2 x 4.0 cm, previously 3.5 x 3.5 cm in 2019     Echocardiogram 08/28/2020 1. Left ventricular ejection fraction, by estimation, is 55 to 60%. The left ventricle has normal function. The left ventricle has no regional wall motion abnormalities. There is mild left ventricular hypertrophy. Left ventricular diastolic parameters were normal. 2. Right ventricular systolic function is normal. The right ventricular size is normal. 3. The mitral valve is normal in structure. No evidence of mitral valve regurgitation. No evidence of mitral stenosis. 4. The aortic valve is tricuspid. Aortic valve regurgitation is not visualized. No aortic stenosis is present.       NST 06/2018 [facility] CONCLUSION: 1.) LIMITATIONS: None. 2.) MYOCARDIAL PERFUSION: No definite evidence for a transmural scar or significant inducible transmural ischemia  3.) LEFT VENTRICULAR EJECTION FRACTION: Normal. 4.) REGIONAL WALL MOTION: Normal. 5.) Incidental note is made of right lower lobe pulmonary mass subpleurally measuring up to 3.5 cm. Recommended contrast enhanced CT chest for further evaluation  2D echo 11/2019FINDINGS:  LEFT VENTRICLE There is normal left ventricular wall thickness. The left ventricular size is  normal. Left ventricular systolic function is normal. LV ejection fraction =  55%. Left ventricular filling pattern is normal.  The left ventricular wall  motion is normal. -  RIGHT VENTRICLE The right ventricle is normal in size and function.  LEFT ATRIUM The left atrium is mildly dilated.  RIGHT ATRIUM  Right atrial size is normal. - AORTIC VALVE Diffuse thickening of the aortic valve with preserved cusp opening. There is  no aortic stenosis. There is no aortic regurgitation. - MITRAL VALVE There is mild mitral annular calcification. There is mild mitral  regurgitation. - TRICUSPID VALVE There is mild tricuspid valve thickening. There is mild tricuspid  regurgitation. No pulmonary hypertension. - PULMONIC VALVE The pulmonic valve is not well visualized. There is no pulmonic valvular  regurgitation. There is no pulmonic valvular stenosis. - ARTERIES The aortic root is normal size. - VENOUS Pulmonary venous flow pattern is blunted. IVC size was mildly dilated. - EFFUSION There is no pericardial effusion. - - MMode/2D Measurements & Calculations IVSd: 1.1 cm LVIDd: 5.6 cm LVPWd: 1.1 cm LVIDs: 4.7 cm LA diam: 4.0 cm EDV(MOD-sp4): 159.0 ml ESV(MOD-sp4): 79.1 ml EDV(MOD-sp2): 150.6 ml ESV(MOD-sp2): 65.6 ml Ao sinus diam: 2.9 cm asc Aorta Diam: 3.0 cm LVOT diam: 2.0 cm SV(MOD-sp4): 79.9 ml SI(MOD-sp4): 32.0 ml/m2 IVC 1: 2.0 cm LA area A2: 23.8 cm2 LA area A4: 24.6 cm2 LA vol: 77.9 ml LA vol index: 31.2 ml/m2 RA area A4: 23.3 cm2 Doppler Measurements & Calculations MV E max vel: 135.7 cm/sec MV A max vel: 101.8 cm/sec MV E/A: 1.3 Med Peak E' Vel: 7.3 cm/sec Lat Peak E' Vel: 13.1 cm/sec E/Lat E`: 10.4 E/Med E`: 18.6 MV dec time: 0.18 sec SV(LVOT): 106.7 ml Ao V2 max: 220.0 cm/sec Ao max PG: 19.4 mmHg Ao V2 mean: 135.5 cm/sec Ao mean PG: 8.5 mmHg Ao V2 VTI: 46.1 cm AVA (VTI): 2.3 cm2 LV V1 VTI: 33.7 cm TR max vel: 188.8 cm/sec TR max PG: 14.3 mmHg RVSP(TR): 24.3 mmHg RAP systole: 10.0 mmHg AS Dimensionless Index (VTI): 0.73 AVAi(VTI) cm^2/m^2: 0.93 cm2 SV  index(LVOT): 42.8 ml/m2   Reading Physician: [place], [id number] 06/18/2018 02:42 PM   Assessment and Plan:   1. Palpitations Denies any recent palpitations.  Continue metoprolol 50 mg p.o. twice daily.  2. Chronic diastolic heart failure (HCC) No recent weight gain  or lower extremity edema.  Does complain of shortness of breath with exertion.  Continue furosemide 40 mg daily.  Metoprolol 50 mg p.o. twice daily.  Potassium supplementation.  3. Essential hypertension Blood pressure elevated today on arrival.  Continue metoprolol 50 mg p.o. twice daily.  Continue Lasix 40 mg daily.  4. Mixed hyperlipidemia Continue simvastatin 40 mg daily.  5. Chest pain, unspecified type Denies any chest pain today.  Recent stress test was low risk for ischemia and considered negative   6. DOE (dyspnea on exertion)   Has a normal systolic cardiac murmur.  Last echo November 2019 showed EF of 55%.  Mild MR, mild TR.   Recent echo showed improvement of EF of 55 to 60%.  7. Lung mass Last nuclear stress test showed evidence of 3.5 cm subpleural lung mass which recommended a CT scan with contrast to evaluate the mass.  Apparently this has not been followed.  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  Please refer to [facility] pulmonary clinic for evaluation of pulmonary hamartoma  Medication Adjustments/Labs and Tests Ordered: Current medicines are reviewed at length with the patient today.  Concerns regarding medicines are outlined above.   Disposition: Follow-up with Dr. Domenic Polite or APP 6 months  Signed, Levell July, NP 09/04/2020 11:14 AM    [facility] [place], [place], [place] [address] Phone: [phone]; Fax: [phone]

## 2020-09-04 ENCOUNTER — Ambulatory Visit (INDEPENDENT_AMBULATORY_CARE_PROVIDER_SITE_OTHER): Payer: Medicare Other | Admitting: Family Medicine

## 2020-09-04 ENCOUNTER — Telehealth: Payer: Self-pay | Admitting: *Deleted

## 2020-09-04 ENCOUNTER — Encounter: Payer: Self-pay | Admitting: Family Medicine

## 2020-09-04 VITALS — BP 144/80 | HR 62 | Ht 69.0 in | Wt 327.2 lb

## 2020-09-04 DIAGNOSIS — Q859 Phakomatosis, unspecified: Secondary | ICD-10-CM

## 2020-09-04 DIAGNOSIS — R079 Chest pain, unspecified: Secondary | ICD-10-CM | POA: Diagnosis not present

## 2020-09-04 DIAGNOSIS — R002 Palpitations: Secondary | ICD-10-CM

## 2020-09-04 DIAGNOSIS — E782 Mixed hyperlipidemia: Secondary | ICD-10-CM

## 2020-09-04 DIAGNOSIS — I5022 Chronic systolic (congestive) heart failure: Secondary | ICD-10-CM

## 2020-09-04 NOTE — Progress Notes (Signed)
Will make a referral to Dr Elsworth Soho or Melvyn Novas. Thanks

## 2020-09-04 NOTE — Telephone Encounter (Signed)
Myriam Jacobson Cox informed and verbalized understanding of plan.

## 2020-09-04 NOTE — Telephone Encounter (Signed)
Satira Sark, MD  Verta Ellen., NP Cc: Merlene Laughter, RN Jonni Sanger - I have not met this patient. Who is following the pulmonary hamartoma? Is this something that needs further evaluation, looks like size is increased by CT. I would suggest that you discuss this with one of the Pulmonary physicians.

## 2020-09-04 NOTE — Patient Instructions (Addendum)

## 2020-09-14 DIAGNOSIS — R519 Headache, unspecified: Secondary | ICD-10-CM | POA: Diagnosis not present

## 2020-09-14 DIAGNOSIS — N200 Calculus of kidney: Secondary | ICD-10-CM | POA: Diagnosis not present

## 2020-09-14 DIAGNOSIS — J449 Chronic obstructive pulmonary disease, unspecified: Secondary | ICD-10-CM | POA: Diagnosis not present

## 2020-09-14 DIAGNOSIS — Z0001 Encounter for general adult medical examination with abnormal findings: Secondary | ICD-10-CM | POA: Diagnosis not present

## 2020-09-14 DIAGNOSIS — I10 Essential (primary) hypertension: Secondary | ICD-10-CM | POA: Diagnosis not present

## 2020-09-15 ENCOUNTER — Encounter: Payer: Self-pay | Admitting: Neurology

## 2020-09-22 DIAGNOSIS — J449 Chronic obstructive pulmonary disease, unspecified: Secondary | ICD-10-CM | POA: Diagnosis not present

## 2020-09-22 DIAGNOSIS — J411 Mucopurulent chronic bronchitis: Secondary | ICD-10-CM | POA: Diagnosis not present

## 2020-09-28 DIAGNOSIS — Z87891 Personal history of nicotine dependence: Secondary | ICD-10-CM | POA: Diagnosis not present

## 2020-09-28 DIAGNOSIS — J441 Chronic obstructive pulmonary disease with (acute) exacerbation: Secondary | ICD-10-CM | POA: Diagnosis not present

## 2020-09-28 DIAGNOSIS — I10 Essential (primary) hypertension: Secondary | ICD-10-CM | POA: Diagnosis not present

## 2020-09-28 DIAGNOSIS — I2510 Atherosclerotic heart disease of native coronary artery without angina pectoris: Secondary | ICD-10-CM | POA: Diagnosis not present

## 2020-09-30 ENCOUNTER — Ambulatory Visit: Payer: Medicare Other | Admitting: Urology

## 2020-10-01 DIAGNOSIS — R700 Elevated erythrocyte sedimentation rate: Secondary | ICD-10-CM | POA: Diagnosis not present

## 2020-10-01 DIAGNOSIS — Z1389 Encounter for screening for other disorder: Secondary | ICD-10-CM | POA: Diagnosis not present

## 2020-10-01 DIAGNOSIS — R519 Headache, unspecified: Secondary | ICD-10-CM | POA: Diagnosis not present

## 2020-10-02 ENCOUNTER — Encounter: Payer: Self-pay | Admitting: Pulmonary Disease

## 2020-10-02 ENCOUNTER — Ambulatory Visit (INDEPENDENT_AMBULATORY_CARE_PROVIDER_SITE_OTHER): Payer: Medicare Other | Admitting: Pulmonary Disease

## 2020-10-02 ENCOUNTER — Other Ambulatory Visit: Payer: Self-pay

## 2020-10-02 VITALS — BP 138/88 | HR 74 | Temp 98.4°F | Ht 69.0 in | Wt 327.2 lb

## 2020-10-02 DIAGNOSIS — R918 Other nonspecific abnormal finding of lung field: Secondary | ICD-10-CM

## 2020-10-02 DIAGNOSIS — J411 Mucopurulent chronic bronchitis: Secondary | ICD-10-CM

## 2020-10-02 DIAGNOSIS — Z6841 Body Mass Index (BMI) 40.0 and over, adult: Secondary | ICD-10-CM

## 2020-10-02 DIAGNOSIS — G4734 Idiopathic sleep related nonobstructive alveolar hypoventilation: Secondary | ICD-10-CM | POA: Diagnosis not present

## 2020-10-02 NOTE — Patient Instructions (Signed)
Will arrange for pulmonary function test and call with results  Will arrange for CT chest without contrast for July 2022 and schedule follow up appointment after this

## 2020-10-02 NOTE — Progress Notes (Signed)
[facility]  Chief Complaint  Patient presents with  . Consult    Productive cough with yellow phlegm     Constitutional:  BP 138/88 (BP Location: Right Wrist, Cuff Size: Normal)   Pulse 74   Temp 98.4 F (36.9 C) (Temporal)   Ht '5\' 9"'  (1.753 m)   Wt (!) 327 lb 3.2 oz (148.4 kg)   SpO2 92% Comment: Room air  BMI 48.32 kg/m   Past Medical History:  Anxiety, OA, Asthma, Back pain, Nephrolithiasis, CAD, DM, GERD, Gastric ulcer, HLD, HTN, Diastolic CHF, Depression, Neuropathy, Seizures  Past Surgical History:  He  has a past surgical history that includes Lithotripsy (2009) and Multiple extractions with alveoloplasty (N/A, 12/17/2012).  Brief Summary:  Charles Mathews is a 60 y.o. male former smoker with lung mass.      Subjective:   He is accompanied by his CNA.   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  He was seen by Dr. Lake Bells in 2014.  This was stable from 2012 and determined that he didn't need additional follow up.  He had repeat CT chest in January 2022 which shows increased size of lesion.  As a result he is referred back to pulmonary for further assessment.  He quit smoking years ago.  He was told he has COPD.  He thinks he had a breathing test years ago.  He uses breztri and albuterol and these help some.  He uses 3 liters oxygen at night and sometimes during the day.  He was told he had sleep apnea, but CPAP made him feel like he was getting smothered.  He gets discomfort in middle of his chest.  Has occasional cough with clear sputum.  Not having fever or hemoptysis.  Physical Exam:   Appearance - well kempt, obese  ENMT - no sinus tenderness, no oral exudate, no LAN, Mallampati 3 airway, no stridor, poor dentition  Respiratory - equal breath sounds bilaterally, no wheezing or rales  CV - s1s2 regular rate and rhythm, no murmurs  Ext - no clubbing, no edema  Skin - no rashes  Psych - normal mood and  affect   Pulmonary testing:    Chest Imaging:   CT chest 04/01/11 >> 3.5 x 2.9 mass RLL  PET scan 11/15/12 >> fat density and calcifications in Rt lower lung mass 2.9 cm  CT chest 06/05/13 >> 3.5 x 3.1 cm RLL mass with fat and calcification  CT chest 08/28/20 >> superior segment RLL hamartoma 4.2 x 4 cm   Sleep Tests:    Cardiac Tests:   Echo 08/28/20 >> EF 55 to 60%, mild LVH  Social History:  He  reports that he quit smoking about 18 years ago. His smoking use included cigarettes. He has a 54.00 pack-year smoking history. He has never used smokeless tobacco. He reports that he does not drink alcohol and does not use drugs.  Family History:  His family history is not on file. He was adopted.    Discussion:  He has history of right lower lobe hamartoma dating back to at least 2012 (although likely has been present for much longer).  Most recent CT chest shows some increase in size over an 8 year time period.    He also has history of tobacco abuse and reported history of COPD.  He reports history of sleep apnea but wasn't able to tolerate CPAP.  He likely has obesity hypoventilation and has  been maintained on 3 liters oxygen at night and as needed with exertion during the day.  Assessment/Plan:   Right lower lobe hamartoma. - I don't think he would be a suitable surgical candidate for resection, and uncertain whether this would even be indicated - will plan to repeat CT chest w/o contrast in July 2022 to monitor trajectory of lesion growth  History of tobacco abuse with reported history of COPD. - continue breztri and prn albuterol - will arrange for pulmonary function test  Obesity hypoventilation syndrome. - intolerant of CPAP - continue 3 liters at night and with exertion during the day  Morbid obesity with BMI 48.32. - discussed importance of weight loss  Palpitations, Chronic diastolic CHF, Hypertension, Hyperlipidemia. - followed by Dr. Johnny Bridge with [facility]  Time Spent Involved in Patient Care on Day of Examination:  47 minutes  Follow up:  Patient Instructions  Will arrange for pulmonary function test and call with results  Will arrange for CT chest without contrast for July 2022 and schedule follow up appointment after this   Medication List:   Allergies as of 10/02/2020      Reactions   Bee Venom Anaphylaxis   Benadryl [diphenhydramine]    Makes him feel weird, like he is going to pass out   Lorazepam Other (See Comments)   Makes him feel woozy.   Lorazepam Other (See Comments)   Penicillins Other (See Comments)   CHILDHOOD ALLERGY      Medication List       Accurate as of October 02, 2020 11:08 AM. If you have any questions, ask your nurse or doctor.        Accu-Chek Guide test strip Generic drug: glucose blood USE ONCE DAILY TO TEST SUGAR   Accu-Chek Guide w/Device Kit USE ONCE DAILY TO TEST SUAGR   Accu-Chek Softclix Lancets lancets USE TO TEST ONCE DAILY   albuterol 108 (90 Base) MCG/ACT inhaler Commonly known as: VENTOLIN HFA INHALE TWO PUFFS BY MOUTH FOUR TIMES DAILY   aspirin EC 81 MG tablet Take 81 mg by mouth daily.   benztropine 1 MG tablet Commonly known as: COGENTIN Take one tablet at night   Breztri Aerosphere 160-9-4.8 MCG/ACT Aero Generic drug: Budeson-Glycopyrrol-Formoterol Inhale 2 puffs into the lungs in the morning and at bedtime.   candesartan 4 MG tablet Commonly known as: ATACAND Take 4 mg by mouth daily.   cetirizine 10 MG tablet Commonly known as: ZYRTEC Take 10 mg by mouth daily.   doxazosin 2 MG tablet Commonly known as: CARDURA TAKE 1 TABLET BY MOUTH DAILY   Fioricet/Codeine 50-300-40-30 MG Caps Generic drug: Butalbital-APAP-Caff-Cod Take 1 capsule by mouth daily.   furosemide 40 MG tablet Commonly known as: LASIX TAKE 1 TABLET BY MOUTH DAILY   Melatonin 10 MG Tabs Take 1 tablet by mouth at bedtime.   metoprolol tartrate 50 MG tablet Commonly known as:  LOPRESSOR TAKE 1 TABLET BY MOUTH TWICE DAILY   multivitamin tablet Take 1 tablet by mouth daily.   omeprazole 20 MG capsule Commonly known as: PRILOSEC Take 20 mg by mouth every morning.   paliperidone 3 MG 24 hr tablet Commonly known as: INVEGA Take one (1) tablet by mouth every morning   potassium chloride SA 20 MEQ tablet Commonly known as: KLOR-CON TAKE 1 TABLET BY MOUTH DAILY   predniSONE 50 MG tablet Commonly known as: DELTASONE Take 50 mg by mouth daily with breakfast.   sertraline 100 MG tablet Commonly known  as: ZOLOFT Take 200 mg by mouth daily.   simvastatin 40 MG tablet Commonly known as: ZOCOR TAKE 1 TABLET BY MOUTH DAILY   topiramate 50 MG tablet Commonly known as: TOPAMAX Take 50 mg by mouth 2 (two) times daily.   traZODone 100 MG tablet Commonly known as: DESYREL Take one (1) tablet by mouth at bedtime, as needed       Signature:  Chesley Mires, MD [place] Pager - [phone] 10/02/2020, 11:08 AM

## 2020-10-05 ENCOUNTER — Ambulatory Visit (HOSPITAL_COMMUNITY)
Admission: RE | Admit: 2020-10-05 | Discharge: 2020-10-05 | Disposition: A | Discharge status: Home / Self Care | Payer: Medicare Other | Source: Ambulatory Visit | Attending: Urology | Admitting: Urology

## 2020-10-05 DIAGNOSIS — N200 Calculus of kidney: Secondary | ICD-10-CM | POA: Diagnosis not present

## 2020-10-06 ENCOUNTER — Telehealth: Payer: Self-pay

## 2020-10-06 NOTE — Telephone Encounter (Signed)
-----   Message from Cleon Gustin, MD sent at 10/06/2020 10:09 AM EST ----- Renal US shows 45mm left renal calculus ----- Message ----- From: Valentina Lucks, LPN Sent: [id number]/04/3266   5:51 PM EST To: Cleon Gustin, MD  Pls review.

## 2020-10-06 NOTE — Telephone Encounter (Signed)
Pts aide was notified of results.

## 2020-10-08 DIAGNOSIS — H0102B Squamous blepharitis left eye, upper and lower eyelids: Secondary | ICD-10-CM | POA: Diagnosis not present

## 2020-10-08 DIAGNOSIS — H04123 Dry eye syndrome of bilateral lacrimal glands: Secondary | ICD-10-CM | POA: Diagnosis not present

## 2020-10-08 DIAGNOSIS — H0102A Squamous blepharitis right eye, upper and lower eyelids: Secondary | ICD-10-CM | POA: Diagnosis not present

## 2020-10-08 DIAGNOSIS — H2513 Age-related nuclear cataract, bilateral: Secondary | ICD-10-CM | POA: Diagnosis not present

## 2020-10-08 DIAGNOSIS — M316 Other giant cell arteritis: Secondary | ICD-10-CM | POA: Diagnosis not present

## 2020-10-12 DIAGNOSIS — J449 Chronic obstructive pulmonary disease, unspecified: Secondary | ICD-10-CM | POA: Diagnosis not present

## 2020-10-12 DIAGNOSIS — I10 Essential (primary) hypertension: Secondary | ICD-10-CM | POA: Diagnosis not present

## 2020-10-12 DIAGNOSIS — E1165 Type 2 diabetes mellitus with hyperglycemia: Secondary | ICD-10-CM | POA: Diagnosis not present

## 2020-10-12 DIAGNOSIS — G4733 Obstructive sleep apnea (adult) (pediatric): Secondary | ICD-10-CM | POA: Diagnosis not present

## 2020-10-12 DIAGNOSIS — N200 Calculus of kidney: Secondary | ICD-10-CM | POA: Diagnosis not present

## 2020-10-12 DIAGNOSIS — R519 Headache, unspecified: Secondary | ICD-10-CM | POA: Diagnosis not present

## 2020-10-16 ENCOUNTER — Ambulatory Visit (INDEPENDENT_AMBULATORY_CARE_PROVIDER_SITE_OTHER): Payer: Medicare Other | Admitting: Urology

## 2020-10-16 ENCOUNTER — Other Ambulatory Visit: Payer: Self-pay

## 2020-10-16 ENCOUNTER — Encounter: Payer: Self-pay | Admitting: Urology

## 2020-10-16 VITALS — BP 92/55 | HR 54 | Temp 99.0°F | Ht 69.0 in | Wt 327.0 lb

## 2020-10-16 DIAGNOSIS — N200 Calculus of kidney: Secondary | ICD-10-CM | POA: Diagnosis not present

## 2020-10-16 LAB — URINALYSIS, ROUTINE W REFLEX MICROSCOPIC
Bilirubin, UA: NEGATIVE
Glucose, UA: NEGATIVE
Ketones, UA: NEGATIVE
Leukocytes,UA: NEGATIVE
Nitrite, UA: NEGATIVE
Protein,UA: NEGATIVE
RBC, UA: NEGATIVE
Specific Gravity, UA: 1.02 (ref 1.005–1.030)
Urobilinogen, Ur: 0.2 mg/dL (ref 0.2–1.0)
pH, UA: 5.5 (ref 5.0–7.5)

## 2020-10-16 NOTE — Patient Instructions (Signed)

## 2020-10-16 NOTE — Progress Notes (Signed)
10/16/2020 10:07 AM   Charles Mathews October 07, 1960 [id number]  Referring provider: Practice, Charles Mathews,  Charles Mathews [id number]  followup nepholithiasis  HPI: [name] is a 60yo here for followup for nephrolithiasis. No stone events since last visit. He denies any flank pain. No significant LUTS. Renal US from 10/05/2020 shows a left 43m renal calculus.    PMH: Past Medical History:  Diagnosis Date  . Anxiety   . Arthritis   . Asthma    Albuterol prn  . Chronic back pain   . Chronic kidney disease    kidney stones  . Coronary artery disease    11/01/12 he denies known CAD/MI/CHF history  . Diabetes mellitus    borderline  . Gastric ulcer   . GERD (gastroesophageal reflux disease)    takes Prilosec daily  . High cholesterol    takes zocor daily  . Hypertension    takes metoprolol daily and cardura  . Insomnia   . Joint pain   . Lung mass   . Major depression    takes Zoloft and effexor daily  . Migraine    last one couple nights ago and takes Topamax daily  . Morbid obesity ([place]   . Neuropathy   . Seizures ([place]    last one a couple of months ago;takes cogentin daily  . Shortness of breath    with exertion  . Urinary frequency   . Urinary urgency     Surgical History: Past Surgical History:  Procedure Laterality Date  . LITHOTRIPSY  2009  . MULTIPLE EXTRACTIONS WITH ALVEOLOPLASTY N/A 12/17/2012   Procedure: MULTIPLE EXTRACTION WITH ALVEOLOPLASTY;  Surgeon: Charles Mathews;  [place] [facility]  Service: Oral Surgery;  Laterality: N/A;    Home Medications:  Allergies as of 10/16/2020      Reactions   Bee Venom Anaphylaxis   Benadryl [diphenhydramine]    Makes him feel weird, like he is going to pass out   Lorazepam Other (See Comments)   Makes him feel woozy.   Lorazepam Other (See Comments)   Penicillins Other (See Comments)   CHILDHOOD ALLERGY      Medication List       Accurate as of October 16, 2020 10:07 AM. If you have any  questions, ask your nurse or doctor.        Accu-Chek Guide test strip Generic drug: glucose blood USE ONCE DAILY TO TEST SUGAR   Accu-Chek Guide w/Device Kit USE ONCE DAILY TO TEST SUAGR   Accu-Chek Softclix Lancets lancets USE TO TEST ONCE DAILY   acetaminophen-codeine 300-15 MG tablet Commonly known as: TYLENOL #2   albuterol 108 (90 Base) MCG/ACT inhaler Commonly known as: VENTOLIN HFA INHALE TWO PUFFS BY MOUTH FOUR TIMES DAILY   ALPRAZolam 1 MG tablet Commonly known as: XANAX SMARTSIG:1 Tablet(s) By Mouth   aspirin EC 81 MG tablet Take 81 mg by mouth daily.   benztropine 1 MG tablet Commonly known as: COGENTIN Take one tablet at night   Breztri Aerosphere 160-9-4.8 MCG/ACT Aero Generic drug: Budeson-Glycopyrrol-Formoterol Inhale 2 puffs into the lungs in the morning and at bedtime.   Butalbital-APAP-Caff-Cod 50-300-40-30 MG Caps Take 1 capsule by mouth daily.   candesartan 4 MG tablet Commonly known as: ATACAND Take 4 mg by mouth daily.   candesartan 16 MG tablet Commonly known as: ATACAND take 1/2 tablet (814m BY MOUTH DAILY   cetirizine 10 MG tablet Commonly known as: ZYRTEC Take 10 mg by  mouth daily.   doxazosin 2 MG tablet Commonly known as: CARDURA TAKE 1 TABLET BY MOUTH DAILY   furosemide 40 MG tablet Commonly known as: LASIX TAKE 1 TABLET BY MOUTH DAILY   Melatonin 10 MG Tabs Take 1 tablet by mouth at bedtime.   metFORMIN 500 MG tablet Commonly known as: GLUCOPHAGE Take 500 mg by mouth daily.   metoprolol tartrate 50 MG tablet Commonly known as: LOPRESSOR TAKE 1 TABLET BY MOUTH TWICE DAILY   multivitamin tablet Take 1 tablet by mouth daily.   omeprazole 20 MG capsule Commonly known as: PRILOSEC Take 20 mg by mouth every morning.   paliperidone 3 MG 24 hr tablet Commonly known as: INVEGA Take one (1) tablet by mouth every morning   potassium chloride SA 20 MEQ tablet Commonly known as: KLOR-CON TAKE 1 TABLET BY MOUTH  DAILY   predniSONE 50 MG tablet Commonly known as: DELTASONE Take 50 mg by mouth daily with breakfast.   sertraline 100 MG tablet Commonly known as: ZOLOFT Take 200 mg by mouth daily.   simvastatin 40 MG tablet Commonly known as: ZOCOR TAKE 1 TABLET BY MOUTH DAILY   topiramate 50 MG tablet Commonly known as: TOPAMAX Take 50 mg by mouth 2 (two) times daily.   traZODone 100 MG tablet Commonly known as: DESYREL Take one (1) tablet by mouth at bedtime, as needed       Allergies:  Allergies  Allergen Reactions  . Bee Venom Anaphylaxis  . Benadryl [Diphenhydramine]     Makes him feel weird, like he is going to pass out  . Lorazepam Other (See Comments)    Makes him feel woozy.  . Lorazepam Other (See Comments)  . Penicillins Other (See Comments)    CHILDHOOD ALLERGY    Family History: Family History  Adopted: Yes    Social History:  reports that he quit smoking about 18 years ago. His smoking use included cigarettes. He has a 54.00 pack-year smoking history. He has never used smokeless tobacco. He reports that he does not drink alcohol and does not use drugs.  ROS: All other review of systems were reviewed and are negative except what is noted above in HPI  Physical Exam: BP (!) 92/55   Pulse (!) 54   Temp 99 F (37.2 C)   Ht '5\' 9"'  (1.753 m)   Wt (!) 327 lb (148.3 kg)   BMI 48.29 kg/m   Constitutional:  Alert and oriented, No acute distress. HEENT: [place] AT, moist mucus membranes.  Trachea midline, no masses. Cardiovascular: No clubbing, cyanosis, or edema. Respiratory: Normal respiratory effort, no increased work of breathing. GI: Abdomen is soft, nontender, nondistended, no abdominal masses GU: No CVA tenderness.  Lymph: No cervical or inguinal lymphadenopathy. Skin: No rashes, bruises or suspicious lesions. Neurologic: Grossly intact, no focal deficits, moving all 4 extremities. Psychiatric: Normal mood and affect.  Laboratory Data: Lab Results   Component Value Date   WBC 11.9 (H) 12/14/2012   HGB 13.7 12/14/2012   HCT 41.5 12/14/2012   MCV 87.7 12/14/2012   PLT 169 12/14/2012    Lab Results  Component Value Date   CREATININE 1.04 12/14/2012    Lab Results  Component Value Date   PSA 0.15 04/02/2011    No results found for: TESTOSTERONE  No results found for: HGBA1C  Urinalysis    Component Value Date/Time   COLORURINE YELLOW 04/01/2011 [place] 04/01/2011 1533   LABSPEC 1.015 04/01/2011 1533   PHURINE  8.0 04/01/2011 1533   GLUCOSEU NEGATIVE 04/01/2011 1533   HGBUR NEGATIVE 04/01/2011 1533   BILIRUBINUR + 02/12/2020 [place] 04/01/2011 1533   PROTEINUR Negative 02/12/2020 1144   PROTEINUR NEGATIVE 04/01/2011 1533   UROBILINOGEN 0.2 02/12/2020 1144   UROBILINOGEN 0.2 04/01/2011 1533   NITRITE neg 02/12/2020 1144   NITRITE NEGATIVE 04/01/2011 1533   LEUKOCYTESUR Trace (A) 02/12/2020 1144    No results found for: LABMICR, WBCUA, RBCUA, LABEPIT, MUCUS, BACTERIA  Pertinent Imaging: Renal US 10/05/2020: Images reviewed and discussed with the patient Results for orders placed during the hospital encounter of 10/11/18  DG Abd 1 View  Narrative CLINICAL DATA:  Right flank soreness.  EXAM: ABDOMEN - 1 VIEW  COMPARISON:  Abdominal radiographs, 06/27/2018.  CT, 03/03/2018.  FINDINGS: No evidence of renal or ureteral stones. Normal bowel gas pattern. Soft tissues are unremarkable. Skeletal structures are within normal limits.  IMPRESSION: Negative exam. No evidence of renal or ureteral stones. Intrarenal stones noted on the prior CT are either no longer present or, more likely, not visualized due to overlying bowel gas and stool.   Electronically Signed By: Lajean Manes M.D. On: 10/11/2018 20:56  No results found for this or any previous visit.  No results found for this or any previous visit.  No results found for this or any previous visit.  Results for  orders placed during the hospital encounter of 10/05/20  Ultrasound renal complete  Narrative CLINICAL DATA:  History nephrolithiasis now with right lower quadrant discomfort. Lithotripsy in 2009.  EXAM: RENAL / URINARY TRACT ULTRASOUND COMPLETE  COMPARISON:  Renal ultrasound February 18, 2020  FINDINGS: Right Kidney:  Renal measurements: 12.7 x 6.2 x 6.1 cm = volume: 254 mL. Echogenicity within normal limits. No mass or hydronephrosis visualized.  Left Kidney:  Renal measurements: 13.2 x 6.2 x 5.0 cm = volume: 210 mL. Echogenicity within normal limits. 7 mm renal calculus. No mass or hydronephrosis visualized.  Bladder:  Appears normal for degree of bladder distention.  Other:  None.  IMPRESSION: 1. 7 mm left renal calculus. No hydronephrosis.   Electronically Signed By: Dahlia Bailiff MD On: 10/05/2020 15:56  No results found for this or any previous visit.  No results found for this or any previous visit.  No results found for this or any previous visit.   Assessment & Plan:    1. Nephrolithiasis --We discussed the management of kidney stones. These options include observation, ureteroscopy, shockwave lithotripsy (ESWL) and percutaneous nephrolithotomy (PCNL). We discussed which options are relevant to the patient's stone(s). We discussed the natural history of kidney stones as well as the complications of untreated stones and the impact on quality of life without treatment as well as with each of the above listed treatments. We also discussed the efficacy of each treatment in its ability to clear the stone burden. With any of these management options I discussed the signs and symptoms of infection and the need for emergent treatment should these be experienced. For each option we discussed the ability of each procedure to clear the patient of their stone burden.   For observation I described the risks which include but are not limited to silent renal damage,  life-threatening infection, need for emergent surgery, failure to pass stone and pain.   For ureteroscopy I described the risks which include bleeding, infection, damage to contiguous structures, positioning injury, ureteral stricture, ureteral avulsion, ureteral injury, need for prolonged ureteral stent, inability to perform ureteroscopy, need  for an interval procedure, inability to clear stone burden, stent discomfort/pain, heart attack, stroke, pulmonary embolus and the inherent risks with general anesthesia.   For shockwave lithotripsy I described the risks which include arrhythmia, kidney contusion, kidney hemorrhage, need for transfusion, pain, inability to adequately break up stone, inability to pass stone fragments, Steinstrasse, infection associated with obstructing stones, need for alternate surgical procedure, need for repeat shockwave lithotripsy, MI, CVA, PE and the inherent risks with anesthesia/conscious sedation.   For PCNL I described the risks including positioning injury, pneumothorax, hydrothorax, need for chest tube, inability to clear stone burden, renal laceration, arterial venous fistula or malformation, need for embolization of kidney, loss of kidney or renal function, need for repeat procedure, need for prolonged nephrostomy tube, ureteral avulsion, MI, CVA, PE and the inherent risks of general anesthesia.   - The patient would like to proceed with left ureteroscopic stone extraction - Urinalysis, Routine w reflex microscopic   No follow-ups on file.  Nicolette Bang, MD  [facility] [place]

## 2020-10-16 NOTE — Progress Notes (Signed)
Urological Symptom Review  Patient is experiencing the following symptoms: Frequent urination Get up at night to urinate  Kidney stones   Review of Systems  Gastrointestinal (upper)  : Negative for upper GI symptoms  Gastrointestinal (lower) : Negative for lower GI symptoms  Constitutional : Negative for symptoms  Skin: Negative for skin symptoms  Eyes: Negative for eye symptoms  Ear/Nose/Throat : Negative for Ear/Nose/Throat symptoms  Hematologic/Lymphatic: Negative for Hematologic/Lymphatic symptoms  Cardiovascular : Negative for cardiovascular symptoms  Respiratory : Negative for respiratory symptoms  Endocrine: Negative for endocrine symptoms  Musculoskeletal: Joint pain  Neurological: Headaches  Psychologic: Depression  Anxiety

## 2020-10-19 ENCOUNTER — Ambulatory Visit (INDEPENDENT_AMBULATORY_CARE_PROVIDER_SITE_OTHER): Payer: Medicare Other | Admitting: Vascular Surgery

## 2020-10-19 ENCOUNTER — Other Ambulatory Visit: Payer: Self-pay

## 2020-10-19 ENCOUNTER — Encounter: Payer: Self-pay | Admitting: Vascular Surgery

## 2020-10-19 VITALS — BP 133/73 | HR 120 | Temp 97.9°F | Resp 18 | Ht 69.0 in | Wt 327.0 lb

## 2020-10-19 DIAGNOSIS — G4459 Other complicated headache syndrome: Secondary | ICD-10-CM | POA: Diagnosis not present

## 2020-10-19 NOTE — H&P (View-Only) (Signed)
[facility] [place]  Patient name: Charles Mathews MRN: [id number] DOB: 08-19-1960 Sex: male  REASON FOR CONSULT: Evaluation for temporal artery biopsy to rule out temporal arteritis  HPI: Charles Mathews is a 60 y.o. male, who is here today for planning for temporal artery biopsy.  He is with his CMA, Rockney Ghee, who helps provide details.  He does have some type of cognitive impairment and she is extremely helpful in providing details.  Apparently he was found to have elevated sed rate.  I do not have this laboratory data.  He has chronic headache and visual changes.  More so on the right headache than left.  We have been consulted for biopsy to rule out temporal arteritis.  He has multiple medical difficulties as noted below.  Past Medical History:  Diagnosis Date  . Anxiety   . Arthritis   . Asthma    Albuterol prn  . Chronic back pain   . Chronic kidney disease    kidney stones  . Coronary artery disease    11/01/12 he denies known CAD/MI/CHF history  . Diabetes mellitus    borderline  . Gastric ulcer   . GERD (gastroesophageal reflux disease)    takes Prilosec daily  . High cholesterol    takes zocor daily  . Hypertension    takes metoprolol daily and cardura  . Insomnia   . Joint pain   . Lung mass   . Major depression    takes Zoloft and effexor daily  . Migraine    last one couple nights ago and takes Topamax daily  . Morbid obesity ([place])   . Neuropathy   . Seizures ([place])    last one a couple of months ago;takes cogentin daily  . Shortness of breath    with exertion  . Urinary frequency   . Urinary urgency     Family History  Adopted: Yes    SOCIAL HISTORY: Social History   Socioeconomic History  . Marital status: Single    Spouse name: Not on file  . Number of children: Not on file  . Years of education: Not on file  . Highest education level: Not on file  Occupational History  . Not on file   Tobacco Use  . Smoking status: Former Smoker    Packs/day: 2.00    Years: 27.00    Pack years: 54.00    Types: Cigarettes    Quit date: 08/01/2002    Years since quitting: 18.2  . Smokeless tobacco: Never Used  Substance and Sexual Activity  . Alcohol use: No  . Drug use: No  . Sexual activity: Never  Other Topics Concern  . Not on file  Social History Narrative  . Not on file   Social Determinants of Health   Financial Resource Strain: Not on file  Food Insecurity: Not on file  Transportation Needs: Not on file  Physical Activity: Not on file  Stress: Not on file  Social Connections: Not on file  Intimate Partner Violence: Not on file    Allergies  Allergen Reactions  . Bee Venom Anaphylaxis  . Benadryl [Diphenhydramine]     Makes him feel weird, like he is going to pass out  . Lorazepam Other (See Comments)    Makes him feel woozy.  . Lorazepam Other (See Comments)  . Penicillins Other (See Comments)    CHILDHOOD ALLERGY    Current Outpatient Medications  Medication Sig Dispense Refill  .  ACCU-CHEK GUIDE test strip USE ONCE DAILY TO TEST SUGAR    . Accu-Chek Softclix Lancets lancets USE TO TEST ONCE DAILY    . acetaminophen-codeine (TYLENOL #2) 300-15 MG tablet     . albuterol (VENTOLIN HFA) 108 (90 Base) MCG/ACT inhaler INHALE TWO PUFFS BY MOUTH FOUR TIMES DAILY    . ALPRAZolam (XANAX) 1 MG tablet SMARTSIG:1 Tablet(s) By Mouth    . aspirin EC 81 MG tablet Take 81 mg by mouth daily.    . benztropine (COGENTIN) 1 MG tablet Take one tablet at night    . Blood Glucose Monitoring Suppl (ACCU-CHEK GUIDE) w/Device KIT USE ONCE DAILY TO TEST SUAGR    . Budeson-Glycopyrrol-Formoterol (BREZTRI AEROSPHERE) 160-9-4.8 MCG/ACT AERO Inhale 2 puffs into the lungs in the morning and at bedtime.    . Butalbital-APAP-Caff-Cod 50-300-40-30 MG CAPS Take 1 capsule by mouth daily.    . candesartan (ATACAND) 16 MG tablet take 1/2 tablet (46m) BY MOUTH DAILY    . candesartan (ATACAND)  4 MG tablet Take 4 mg by mouth daily.    . cetirizine (ZYRTEC) 10 MG tablet Take 10 mg by mouth daily.    .Marland Kitchendoxazosin (CARDURA) 2 MG tablet TAKE 1 TABLET BY MOUTH DAILY 30 tablet 6  . furosemide (LASIX) 40 MG tablet TAKE 1 TABLET BY MOUTH DAILY 90 tablet 1  . Melatonin 10 MG TABS Take 1 tablet by mouth at bedtime.    . metFORMIN (GLUCOPHAGE) 500 MG tablet Take 500 mg by mouth daily.    . metoprolol tartrate (LOPRESSOR) 50 MG tablet TAKE 1 TABLET BY MOUTH TWICE DAILY 60 tablet 6  . Multiple Vitamin (MULTIVITAMIN) tablet Take 1 tablet by mouth daily.    .Marland Kitchenomeprazole (PRILOSEC) 20 MG capsule Take 20 mg by mouth every morning.    . paliperidone (INVEGA) 3 MG 24 hr tablet Take one (1) tablet by mouth every morning    . potassium chloride SA (KLOR-CON) 20 MEQ tablet TAKE 1 TABLET BY MOUTH DAILY 90 tablet 1  . predniSONE (DELTASONE) 50 MG tablet Take 50 mg by mouth daily with breakfast.    . sertraline (ZOLOFT) 100 MG tablet Take 200 mg by mouth daily.    . simvastatin (ZOCOR) 40 MG tablet TAKE 1 TABLET BY MOUTH DAILY 30 tablet 6  . topiramate (TOPAMAX) 50 MG tablet Take 50 mg by mouth 2 (two) times daily.    . traZODone (DESYREL) 100 MG tablet Take one (1) tablet by mouth at bedtime, as needed     No current facility-administered medications for this visit.    REVIEW OF SYSTEMS:  [X] denotes positive finding, [ ] denotes negative finding Cardiac  Comments:  Chest pain or chest pressure:    Shortness of breath upon exertion:    Short of breath when lying flat:    Irregular heart rhythm:        Vascular    Pain in calf, thigh, or hip brought on by ambulation:    Pain in feet at night that wakes you up from your sleep:     Blood clot in your veins:    Leg swelling:         Pulmonary    Oxygen at home: x   Productive cough:     Wheezing:  x       Neurologic    Sudden weakness in arms or legs:     Sudden numbness in arms or legs:     Sudden onset of difficulty speaking  or slurred  speech:    Temporary loss of vision in one eye:     Problems with dizziness:  x       Gastrointestinal    Blood in stool:     Vomited blood:         Genitourinary    Burning when urinating:     Blood in urine:        Psychiatric    Major depression:         Hematologic    Bleeding problems:    Problems with blood clotting too easily:        Skin    Rashes or ulcers:        Constitutional    Fever or chills:      PHYSICAL EXAM: Vitals:   10/19/20 0907  BP: 133/73  Pulse: (!) 120  Resp: 18  Temp: 97.9 F (36.6 C)  TempSrc: Other (Comment)  SpO2: 92%  Weight: (!) 327 lb (148.3 kg)  Height: 5' 9" (1.753 m)    GENERAL: The patient is a well-nourished male, in no acute distress. The vital signs are documented above. CARDIOVASCULAR: 2+ radial pulses bilaterally.  He does have some tenderness on his preauricular area.  Easily palpable temporal arteries bilaterally. PULMONARY: There is good air exchange  MUSCULOSKELETAL: There are no major deformities or cyanosis. NEUROLOGIC: No focal weakness or paresthesias are detected. SKIN: There are no ulcers or rashes noted. PSYCHIATRIC: The patient has a normal affect.  DATA:  None  MEDICAL ISSUES: Complicated headache right greater than left with also visual changes.  Elevated sed rate by history.  Discussed our role for diagnosis with biopsy of temporal arteries been requested.  We will plan for this at [facility] within the next 7 to 10 days.  We will coordinate this with his transportation.  Described the procedure and subcuticular closure.  He does have chronic pulmonary issues.  Will discuss with anesthesia local with sedation versus LMA   Rosetta Posner, MD FACS [facility] [phone] Pager [phone]  Note: Portions of this report may have been transcribed using voice recognition software.  Every effort has been made to ensure accuracy; however, inadvertent  computerized transcription errors may still be present.

## 2020-10-19 NOTE — Progress Notes (Signed)
[facility] [place]  Patient name: Charles Mathews MRN: [id number] DOB: 08-19-1960 Sex: male  REASON FOR CONSULT: Evaluation for temporal artery biopsy to rule out temporal arteritis  HPI: Charles Mathews is a 60 y.o. male, who is here today for planning for temporal artery biopsy.  He is with his CMA, Rockney Ghee, who helps provide details.  He does have some type of cognitive impairment and she is extremely helpful in providing details.  Apparently he was found to have elevated sed rate.  I do not have this laboratory data.  He has chronic headache and visual changes.  More so on the right headache than left.  We have been consulted for biopsy to rule out temporal arteritis.  He has multiple medical difficulties as noted below.  Past Medical History:  Diagnosis Date  . Anxiety   . Arthritis   . Asthma    Albuterol prn  . Chronic back pain   . Chronic kidney disease    kidney stones  . Coronary artery disease    11/01/12 he denies known CAD/MI/CHF history  . Diabetes mellitus    borderline  . Gastric ulcer   . GERD (gastroesophageal reflux disease)    takes Prilosec daily  . High cholesterol    takes zocor daily  . Hypertension    takes metoprolol daily and cardura  . Insomnia   . Joint pain   . Lung mass   . Major depression    takes Zoloft and effexor daily  . Migraine    last one couple nights ago and takes Topamax daily  . Morbid obesity ([place])   . Neuropathy   . Seizures ([place])    last one a couple of months ago;takes cogentin daily  . Shortness of breath    with exertion  . Urinary frequency   . Urinary urgency     Family History  Adopted: Yes    SOCIAL HISTORY: Social History   Socioeconomic History  . Marital status: Single    Spouse name: Not on file  . Number of children: Not on file  . Years of education: Not on file  . Highest education level: Not on file  Occupational History  . Not on file   Tobacco Use  . Smoking status: Former Smoker    Packs/day: 2.00    Years: 27.00    Pack years: 54.00    Types: Cigarettes    Quit date: 08/01/2002    Years since quitting: 18.2  . Smokeless tobacco: Never Used  Substance and Sexual Activity  . Alcohol use: No  . Drug use: No  . Sexual activity: Never  Other Topics Concern  . Not on file  Social History Narrative  . Not on file   Social Determinants of Health   Financial Resource Strain: Not on file  Food Insecurity: Not on file  Transportation Needs: Not on file  Physical Activity: Not on file  Stress: Not on file  Social Connections: Not on file  Intimate Partner Violence: Not on file    Allergies  Allergen Reactions  . Bee Venom Anaphylaxis  . Benadryl [Diphenhydramine]     Makes him feel weird, like he is going to pass out  . Lorazepam Other (See Comments)    Makes him feel woozy.  . Lorazepam Other (See Comments)  . Penicillins Other (See Comments)    CHILDHOOD ALLERGY    Current Outpatient Medications  Medication Sig Dispense Refill  .  ACCU-CHEK GUIDE test strip USE ONCE DAILY TO TEST SUGAR    . Accu-Chek Softclix Lancets lancets USE TO TEST ONCE DAILY    . acetaminophen-codeine (TYLENOL #2) 300-15 MG tablet     . albuterol (VENTOLIN HFA) 108 (90 Base) MCG/ACT inhaler INHALE TWO PUFFS BY MOUTH FOUR TIMES DAILY    . ALPRAZolam (XANAX) 1 MG tablet SMARTSIG:1 Tablet(s) By Mouth    . aspirin EC 81 MG tablet Take 81 mg by mouth daily.    . benztropine (COGENTIN) 1 MG tablet Take one tablet at night    . Blood Glucose Monitoring Suppl (ACCU-CHEK GUIDE) w/Device KIT USE ONCE DAILY TO TEST SUAGR    . Budeson-Glycopyrrol-Formoterol (BREZTRI AEROSPHERE) 160-9-4.8 MCG/ACT AERO Inhale 2 puffs into the lungs in the morning and at bedtime.    . Butalbital-APAP-Caff-Cod 50-300-40-30 MG CAPS Take 1 capsule by mouth daily.    . candesartan (ATACAND) 16 MG tablet take 1/2 tablet (85m) BY MOUTH DAILY    . candesartan (ATACAND)  4 MG tablet Take 4 mg by mouth daily.    . cetirizine (ZYRTEC) 10 MG tablet Take 10 mg by mouth daily.    .Marland Kitchendoxazosin (CARDURA) 2 MG tablet TAKE 1 TABLET BY MOUTH DAILY 30 tablet 6  . furosemide (LASIX) 40 MG tablet TAKE 1 TABLET BY MOUTH DAILY 90 tablet 1  . Melatonin 10 MG TABS Take 1 tablet by mouth at bedtime.    . metFORMIN (GLUCOPHAGE) 500 MG tablet Take 500 mg by mouth daily.    . metoprolol tartrate (LOPRESSOR) 50 MG tablet TAKE 1 TABLET BY MOUTH TWICE DAILY 60 tablet 6  . Multiple Vitamin (MULTIVITAMIN) tablet Take 1 tablet by mouth daily.    .Marland Kitchenomeprazole (PRILOSEC) 20 MG capsule Take 20 mg by mouth every morning.    . paliperidone (INVEGA) 3 MG 24 hr tablet Take one (1) tablet by mouth every morning    . potassium chloride SA (KLOR-CON) 20 MEQ tablet TAKE 1 TABLET BY MOUTH DAILY 90 tablet 1  . predniSONE (DELTASONE) 50 MG tablet Take 50 mg by mouth daily with breakfast.    . sertraline (ZOLOFT) 100 MG tablet Take 200 mg by mouth daily.    . simvastatin (ZOCOR) 40 MG tablet TAKE 1 TABLET BY MOUTH DAILY 30 tablet 6  . topiramate (TOPAMAX) 50 MG tablet Take 50 mg by mouth 2 (two) times daily.    . traZODone (DESYREL) 100 MG tablet Take one (1) tablet by mouth at bedtime, as needed     No current facility-administered medications for this visit.    REVIEW OF SYSTEMS:  [X] denotes positive finding, [ ] denotes negative finding Cardiac  Comments:  Chest pain or chest pressure:    Shortness of breath upon exertion:    Short of breath when lying flat:    Irregular heart rhythm:        Vascular    Pain in calf, thigh, or hip brought on by ambulation:    Pain in feet at night that wakes you up from your sleep:     Blood clot in your veins:    Leg swelling:         Pulmonary    Oxygen at home: x   Productive cough:     Wheezing:  x       Neurologic    Sudden weakness in arms or legs:     Sudden numbness in arms or legs:     Sudden onset of difficulty speaking  or slurred  speech:    Temporary loss of vision in one eye:     Problems with dizziness:  x       Gastrointestinal    Blood in stool:     Vomited blood:         Genitourinary    Burning when urinating:     Blood in urine:        Psychiatric    Major depression:         Hematologic    Bleeding problems:    Problems with blood clotting too easily:        Skin    Rashes or ulcers:        Constitutional    Fever or chills:      PHYSICAL EXAM: Vitals:   10/19/20 0907  BP: 133/73  Pulse: (!) 120  Resp: 18  Temp: 97.9 F (36.6 C)  TempSrc: Other (Comment)  SpO2: 92%  Weight: (!) 327 lb (148.3 kg)  Height: 5' 9" (1.753 m)    GENERAL: The patient is a well-nourished male, in no acute distress. The vital signs are documented above. CARDIOVASCULAR: 2+ radial pulses bilaterally.  He does have some tenderness on his preauricular area.  Easily palpable temporal arteries bilaterally. PULMONARY: There is good air exchange  MUSCULOSKELETAL: There are no major deformities or cyanosis. NEUROLOGIC: No focal weakness or paresthesias are detected. SKIN: There are no ulcers or rashes noted. PSYCHIATRIC: The patient has a normal affect.  DATA:  None  MEDICAL ISSUES: Complicated headache right greater than left with also visual changes.  Elevated sed rate by history.  Discussed our role for diagnosis with biopsy of temporal arteries been requested.  We will plan for this at [facility] within the next 7 to 10 days.  We will coordinate this with his transportation.  Described the procedure and subcuticular closure.  He does have chronic pulmonary issues.  Will discuss with anesthesia local with sedation versus LMA   Rosetta Posner, MD FACS [facility] [phone] Pager [phone]  Note: Portions of this report may have been transcribed using voice recognition software.  Every effort has been made to ensure accuracy; however, inadvertent  computerized transcription errors may still be present.

## 2020-10-20 DIAGNOSIS — J411 Mucopurulent chronic bronchitis: Secondary | ICD-10-CM | POA: Diagnosis not present

## 2020-10-20 DIAGNOSIS — J449 Chronic obstructive pulmonary disease, unspecified: Secondary | ICD-10-CM | POA: Diagnosis not present

## 2020-10-27 ENCOUNTER — Other Ambulatory Visit: Payer: Self-pay

## 2020-10-27 ENCOUNTER — Encounter (HOSPITAL_COMMUNITY): Payer: Self-pay | Admitting: Vascular Surgery

## 2020-10-27 MED ORDER — VANCOMYCIN HCL 1500 MG/300ML IV SOLN
1500.0000 mg | INTRAVENOUS | Status: AC
  Administered 2020-10-28: 1500 mg via INTRAVENOUS
  Filled 2020-10-27: qty 300

## 2020-10-27 NOTE — Anesthesia Preprocedure Evaluation (Addendum)
Anesthesia Evaluation  Patient identified by MRN, date of birth, ID band Patient awake    Reviewed: Allergy & Precautions, NPO status , Patient's Chart, lab work & pertinent test results, reviewed documented beta blocker date and time   Airway Mallampati: II  TM Distance: >3 FB Neck ROM: Full    Dental  (+) Dental Advisory Given, Edentulous Upper, Edentulous Lower   Pulmonary asthma , COPD,  oxygen dependent, former smoker,    Pulmonary exam normal breath sounds clear to auscultation       Cardiovascular hypertension, Pt. on medications and Pt. on home beta blockers + CAD  Normal cardiovascular exam Rhythm:Regular Rate:Normal  Nuclear stress test 09/01/20: No diagnostic ST segment changes to indicate ischemia. Small, mild intensity, apical anteroseptal and basal inferolateral defects that are fixed and most consistent with soft tissue attenuation. This is a low risk study. Nuclear stress EF: 52%.   Echo 08/28/20: IMPRESSIONS  1. Left ventricular ejection fraction, by estimation, is 55 to 60%. The  left ventricle has normal function. The left ventricle has no regional  wall motion abnormalities. There is mild left ventricular hypertrophy.  Left ventricular diastolic parameters  were normal.  2. Right ventricular systolic function is normal. The right ventricular  size is normal.  3. The mitral valve is normal in structure. No evidence of mitral valve  regurgitation. No evidence of mitral stenosis.  4. The aortic valve is tricuspid. Aortic valve regurgitation is not  visualized. No aortic stenosis is present.     Neuro/Psych  Headaches, Seizures -,  PSYCHIATRIC DISORDERS Anxiety Depression Bipolar Disorder    GI/Hepatic Neg liver ROS, PUD, GERD  Medicated,  Endo/Other  diabetesMorbid obesity  Renal/GU negative Renal ROS     Musculoskeletal  (+) Arthritis ,   Abdominal   Peds  Hematology negative hematology  ROS (+)   Anesthesia Other Findings Day of surgery medications reviewed with the patient.  Reproductive/Obstetrics                           Anesthesia Physical Anesthesia Plan  ASA: IV  Anesthesia Plan: General   Post-op Pain Management:    Induction: Intravenous  PONV Risk Score and Plan: 2 and Dexamethasone and Ondansetron  Airway Management Planned: Oral ETT  Additional Equipment:   Intra-op Plan:   Post-operative Plan: Extubation in OR  Informed Consent: I have reviewed the patients History and Physical, chart, labs and discussed the procedure including the risks, benefits and alternatives for the proposed anesthesia with the patient or authorized representative who has indicated his/her understanding and acceptance.     Dental advisory given  Plan Discussed with: CRNA  Anesthesia Plan Comments: (PAT note written 10/27/2020 by Myra Gianotti, PA-C. )      Anesthesia Quick Evaluation

## 2020-10-27 NOTE — Progress Notes (Addendum)
I spoke to Meryl Crutch, Mr. Smmth's CNA  With Mr. Pollio in the same room and phone on speaker. Mr. Tobie Poet and Rubin Payor denies any s/s of Covid, they both have had Vaccines and Booster- neither have been in contact with anyone with s/s to there knowledge. Cassie reports that she was told that patient may be tested the morning of surgery. ( Mr. Guyton has an appointment for Covid test today, he will not be tested today.)  Mr. Congrove has type II or Pre- diabetes. Patient takes Metformin daily at hs. Cassie reports that last A1C was 7 something, "it was only elevated, because he is taking `steroid, he took last one yesterday."  I instructed patient to check CBG after awaking and every 2 hours until arrival  to the hospital.  I Instructed patient if CBG is less than 70 to take 4 Glucose Tablets or 1 tube of Glucose Gel or 1/2 cup of a clear juice. Recheck CBG in 15 minutes if CBG is not over 70 call, pre- op desk at [phone] for further instructions.   Cassie reports that Mr. Basso takes Congentin  To prevent seizures, last seizure was when Mr. Coven was 59 or 40.  Mr. Lundberg has sleep apnea, patient could not wear a CPAP, patient was recently seen by a pneumologist and is now on oxygen 24/7 at 3 liters.  I find in Epic where Mr. Oblinger saw a Pulmonologist last in 2014. I  Called  Cassie back to ask who Mr. Sada saw, Rubin Payor reported that Mr. Pham saw his PCP at [facility] not a pulmonologist. I asked anesthesiologgist PA- C to review chart.

## 2020-10-27 NOTE — Progress Notes (Signed)
Anesthesia Chart Review: Charles Mathews  Case: [id number] Date/Time: 10/28/20 1010   Procedure: BIOPSY TEMPORAL ARTERY (N/A )   Anesthesia type: Choice   Pre-op diagnosis: OTHER COMPLICATED HEADACHE SYNDROME   [facility] ROOM [facility] / [facility]   Surgeons: Rosetta Posner, MD      DISCUSSION: Patient is a 60 year old male scheduled for the above procedure.  He is also on the schedule for cystoscopy, left ureteroscopic stone extraction at [facility] on 11/30/20.   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 He denied known CAD history, but has had cardiology evaluation for chest pain. .   Last cardiology visit 09/04/20 with Levell July, NP on 09/04/20 for follow-up palpitations, dyspnea, chest pain, chronic diastolic CHF, HTN. Stress and echo done (see CV below). Stress test was low risk. Echo showed EF 55-60% without significant valvular dysfunction.    Last pulmonology visit with Dr. Halford Chessman 10/02/20. Follow-up chest CT is planned for ~ July 2022 for right lung hamartoma. On home O2 at 3L.  His [employer]/CMA is [employer]. He is for preoperative COVID-19 testing scheduled for day of surgery. To continue ASA. By current medication list, he is on prednisone at 50 mg daily.    VS:   Wt Readings from Last 3 Encounters:  10/19/20 (!) 148.3 kg  10/16/20 (!) 148.3 kg  10/02/20 (!) 148.4 kg   BP Readings from Last 3 Encounters:  10/19/20 133/73  10/16/20 (!) 92/55  10/02/20 138/88   Pulse Readings from Last 3 Encounters:  10/19/20 (!) 120  10/16/20 (!) 54  10/02/20 74    PROVIDERS: Bonnita Hollow, MD is PCP ([place])  - Rozann Lesches, MD is cardiologist. Last visit 09/04/20 with Levell July, NP.  Chesley Mires, MD is pulmonologist. Last visit 10/02/20 to get re-established after recent CT showed increase in size of right lung hamartoma (noted ~ 2012, slow enlargement 08/28/20 CT). He did not think patient would be a suitable surgical candidate for lung resection, and uncertain if even indicated. Repeat Chest CT planned ~ July 2022.  Nicolette Bang, MD is urologist. Last visit 10/16/20 for nephrolithiasis follow-up. Renal US from 10/05/20 showed left 7 mm renal calculus.   LABS: For day of surgery.   IMAGES: CT Chest 08/28/20: IMPRESSION: 1.  No acute intrathoracic process. 2. Slowly enlarging pulmonary hamartoma in the superior segment of the right lower lobe, currently measuring 4.2 x 4.0 cm, previously 3.5 x 3.5 cm in 2019.   EKG: 08/07/20: NSR   CV: Nuclear stress test 09/01/20:  No diagnostic ST segment changes to indicate ischemia.  Small, mild intensity, apical anteroseptal and basal inferolateral defects that are fixed and most consistent with soft tissue attenuation.  This is a low risk study.  Nuclear stress EF: 52%.   Echo 08/28/20: IMPRESSIONS  1. Left ventricular ejection fraction, by estimation, is 55 to 60%. The  left ventricle has normal function. The left ventricle has no regional  wall motion abnormalities. There is mild left ventricular hypertrophy.  Left ventricular diastolic parameters  were normal.  2. Right ventricular systolic function is normal. The right ventricular  size is normal.  3. The mitral valve is normal in structure. No evidence of mitral valve  regurgitation. No evidence of mitral stenosis.  4. The aortic valve is tricuspid. Aortic valve regurgitation is not  visualized. No aortic stenosis is present.    Past Medical History:  Diagnosis Date  . Anxiety   . Arthritis   . Asthma    Albuterol prn  . Bipolar disorder ([place])   . Chronic back pain   . COPD  (chronic obstructive pulmonary disease) ([place])   . Coronary artery disease    11/01/12 he denies known CAD/MI/CHF history  . Diabetes mellitus    borderline  . Gastric ulcer   . GERD (gastroesophageal reflux disease)    takes Prilosec daily  . Head injury   . High cholesterol    takes zocor daily  . History of kidney stones   . Hypertension    takes metoprolol daily and cardura  . Insomnia   . Joint pain   . Lung mass   . Major depression    takes Zoloft and effexor daily  . Migraine    last one - 10/15/20 takes Topamax daily  . Morbid obesity ([place])   . Neuropathy   . Pre-diabetes   . Seizures ([place])     10/27/20- last one age 76-14  . Shortness of breath    with exertion  . Urinary frequency   . Urinary urgency     Past Surgical History:  Procedure Laterality Date  . LITHOTRIPSY  2009  . MULTIPLE EXTRACTIONS WITH ALVEOLOPLASTY N/A 12/17/2012   Procedure: MULTIPLE EXTRACTION WITH ALVEOLOPLASTY;  Surgeon: Gae Bon, DDS;  Location: [facility];  Service: Oral Surgery;  Laterality: N/A;    MEDICATIONS: . [START ON 10/28/2020] vancomycin (VANCOREADY) IVPB 1500 mg/300 mL   . acetaminophen (TYLENOL) 650 MG CR tablet  . albuterol (VENTOLIN HFA) 108 (90 Base) MCG/ACT inhaler  . aspirin EC 81 MG tablet  . benztropine (COGENTIN) 1 MG tablet  . Budeson-Glycopyrrol-Formoterol (BREZTRI AEROSPHERE) 160-9-4.8 MCG/ACT AERO  . candesartan (ATACAND) 8 MG tablet  . cetirizine (ZYRTEC) 10 MG tablet  . doxazosin (CARDURA) 2 MG tablet  . furosemide (LASIX) 40 MG tablet  . Melatonin 10 MG TABS  . metFORMIN (GLUCOPHAGE) 500 MG tablet  . metoprolol tartrate (LOPRESSOR) 50 MG tablet  . Multiple Vitamin (MULTIVITAMIN) tablet  . omeprazole (PRILOSEC) 20 MG capsule  . paliperidone (INVEGA) 3 MG 24 hr tablet  . potassium chloride SA (KLOR-CON) 20 MEQ tablet  . predniSONE (DELTASONE) 50 MG tablet  . sertraline (ZOLOFT) 100 MG tablet  . simvastatin (ZOCOR) 40 MG tablet  . topiramate (TOPAMAX)  50 MG tablet  . traZODone (DESYREL) 100 MG tablet  . ACCU-CHEK GUIDE test strip  . Accu-Chek Softclix Lancets lancets  . Blood Glucose Monitoring Suppl (ACCU-CHEK GUIDE) w/Device KIT    Myra Gianotti, PA-C Surgical Short Stay/Anesthesiology [facility] Phone [phone] [facility] Phone [phone] 10/27/2020 12:28 PM

## 2020-10-28 ENCOUNTER — Ambulatory Visit (HOSPITAL_COMMUNITY)
Admission: RE | Admit: 2020-10-28 | Discharge: 2020-10-28 | Disposition: A | Discharge status: Home / Self Care | Payer: Medicare Other | Attending: Vascular Surgery | Admitting: Vascular Surgery

## 2020-10-28 ENCOUNTER — Ambulatory Visit (HOSPITAL_COMMUNITY): Payer: Medicare Other | Admitting: Vascular Surgery

## 2020-10-28 ENCOUNTER — Encounter (HOSPITAL_COMMUNITY)
Admission: RE | Disposition: A | Discharge status: Home / Self Care | Payer: Self-pay | Source: Home / Self Care | Attending: Vascular Surgery

## 2020-10-28 ENCOUNTER — Encounter (HOSPITAL_COMMUNITY): Payer: Self-pay | Admitting: Vascular Surgery

## 2020-10-28 DIAGNOSIS — I10 Essential (primary) hypertension: Secondary | ICD-10-CM | POA: Diagnosis not present

## 2020-10-28 DIAGNOSIS — Z79899 Other long term (current) drug therapy: Secondary | ICD-10-CM | POA: Diagnosis not present

## 2020-10-28 DIAGNOSIS — E785 Hyperlipidemia, unspecified: Secondary | ICD-10-CM | POA: Diagnosis not present

## 2020-10-28 DIAGNOSIS — Z7982 Long term (current) use of aspirin: Secondary | ICD-10-CM | POA: Diagnosis not present

## 2020-10-28 DIAGNOSIS — J449 Chronic obstructive pulmonary disease, unspecified: Secondary | ICD-10-CM | POA: Diagnosis not present

## 2020-10-28 DIAGNOSIS — K219 Gastro-esophageal reflux disease without esophagitis: Secondary | ICD-10-CM | POA: Insufficient documentation

## 2020-10-28 DIAGNOSIS — G4733 Obstructive sleep apnea (adult) (pediatric): Secondary | ICD-10-CM | POA: Diagnosis not present

## 2020-10-28 DIAGNOSIS — R569 Unspecified convulsions: Secondary | ICD-10-CM | POA: Insufficient documentation

## 2020-10-28 DIAGNOSIS — Z20822 Contact with and (suspected) exposure to covid-19: Secondary | ICD-10-CM | POA: Diagnosis not present

## 2020-10-28 DIAGNOSIS — E119 Type 2 diabetes mellitus without complications: Secondary | ICD-10-CM | POA: Insufficient documentation

## 2020-10-28 DIAGNOSIS — E7800 Pure hypercholesterolemia, unspecified: Secondary | ICD-10-CM | POA: Diagnosis not present

## 2020-10-28 DIAGNOSIS — J441 Chronic obstructive pulmonary disease with (acute) exacerbation: Secondary | ICD-10-CM | POA: Diagnosis not present

## 2020-10-28 DIAGNOSIS — Z7984 Long term (current) use of oral hypoglycemic drugs: Secondary | ICD-10-CM | POA: Diagnosis not present

## 2020-10-28 DIAGNOSIS — F319 Bipolar disorder, unspecified: Secondary | ICD-10-CM | POA: Diagnosis not present

## 2020-10-28 DIAGNOSIS — Z87891 Personal history of nicotine dependence: Secondary | ICD-10-CM | POA: Diagnosis not present

## 2020-10-28 DIAGNOSIS — R519 Headache, unspecified: Secondary | ICD-10-CM | POA: Diagnosis not present

## 2020-10-28 DIAGNOSIS — G4459 Other complicated headache syndrome: Secondary | ICD-10-CM | POA: Diagnosis not present

## 2020-10-28 DIAGNOSIS — I2510 Atherosclerotic heart disease of native coronary artery without angina pectoris: Secondary | ICD-10-CM | POA: Diagnosis not present

## 2020-10-28 DIAGNOSIS — R7303 Prediabetes: Secondary | ICD-10-CM | POA: Diagnosis not present

## 2020-10-28 HISTORY — DX: Unspecified injury of head, initial encounter: S09.90XA

## 2020-10-28 HISTORY — PX: ARTERY BIOPSY: SHX891

## 2020-10-28 HISTORY — DX: Bipolar disorder, unspecified: F31.9

## 2020-10-28 HISTORY — DX: Personal history of urinary calculi: Z87.442

## 2020-10-28 HISTORY — DX: Prediabetes: R73.03

## 2020-10-28 HISTORY — DX: Chronic obstructive pulmonary disease, unspecified: J44.9

## 2020-10-28 LAB — BASIC METABOLIC PANEL
Anion gap: 4 — ABNORMAL LOW (ref 5–15)
BUN: 22 mg/dL — ABNORMAL HIGH (ref 6–20)
CO2: 29 mmol/L (ref 22–32)
Calcium: 8.4 mg/dL — ABNORMAL LOW (ref 8.9–10.3)
Chloride: 107 mmol/L (ref 98–111)
Creatinine, Ser: 1.21 mg/dL (ref 0.61–1.24)
GFR, Estimated: >60 mL/min (ref >60)
Glucose, Bld: 121 mg/dL — ABNORMAL HIGH (ref 70–99)
Potassium: 4.6 mmol/L (ref 3.5–5.1)
Sodium: 140 mmol/L (ref 135–145)

## 2020-10-28 LAB — GLUCOSE, CAPILLARY
Glucose-Capillary: 136 mg/dL — ABNORMAL HIGH (ref 70–99)
Glucose-Capillary: 111 mg/dL — ABNORMAL HIGH (ref 70–99)
Glucose-Capillary: 138 mg/dL — ABNORMAL HIGH (ref 70–99)

## 2020-10-28 LAB — CBC
HCT: 43.6 % (ref 39.0–52.0)
Hemoglobin: 13 g/dL (ref 13.0–17.0)
MCH: 28.5 pg (ref 26.0–34.0)
MCHC: 29.8 g/dL — ABNORMAL LOW (ref 30.0–36.0)
MCV: 95.6 fL (ref 80.0–100.0)
Platelets: 102 10*3/uL — ABNORMAL LOW (ref 150–400)
RBC: 4.56 MIL/uL (ref 4.22–5.81)
RDW: 14.3 % (ref 11.5–15.5)
WBC: 11.1 10*3/uL — ABNORMAL HIGH (ref 4.0–10.5)
nRBC: 0 % (ref 0.0–0.2)

## 2020-10-28 LAB — SARS CORONAVIRUS 2 BY RT PCR (HOSPITAL ORDER, PERFORMED IN ~~LOC~~ HOSPITAL LAB): SARS Coronavirus 2: NEGATIVE

## 2020-10-28 SURGERY — BIOPSY TEMPORAL ARTERY
Anesthesia: General

## 2020-10-28 MED ORDER — ACETAMINOPHEN 500 MG PO TABS
1000.0000 mg | ORAL_TABLET | Freq: Once | ORAL | Status: AC
  Administered 2020-10-28: 1000 mg via ORAL
  Filled 2020-10-28: qty 2

## 2020-10-28 MED ORDER — FENTANYL CITRATE (PF) 100 MCG/2ML IJ SOLN: 25.0000 ug | INTRAMUSCULAR | Status: DC | PRN

## 2020-10-28 MED ORDER — CHLORHEXIDINE GLUCONATE 0.12 % MT SOLN
OROMUCOSAL | Status: AC
  Administered 2020-10-28: 15 mL via OROMUCOSAL
  Filled 2020-10-28: qty 15

## 2020-10-28 MED ORDER — ONDANSETRON HCL 4 MG/2ML IJ SOLN
INTRAMUSCULAR | Status: DC | PRN
  Administered 2020-10-28: 4 mg via INTRAVENOUS

## 2020-10-28 MED ORDER — LACTATED RINGERS IV SOLN: INTRAVENOUS | Status: DC | PRN

## 2020-10-28 MED ORDER — SUCCINYLCHOLINE CHLORIDE 20 MG/ML IJ SOLN
INTRAMUSCULAR | Status: DC | PRN
  Administered 2020-10-28: 120 mg via INTRAVENOUS

## 2020-10-28 MED ORDER — FENTANYL CITRATE (PF) 100 MCG/2ML IJ SOLN
INTRAMUSCULAR | Status: DC | PRN
  Administered 2020-10-28: 50 ug via INTRAVENOUS

## 2020-10-28 MED ORDER — SODIUM CHLORIDE 0.9 % IV SOLN: INTRAVENOUS | Status: DC

## 2020-10-28 MED ORDER — 0.9 % SODIUM CHLORIDE (POUR BTL) OPTIME
TOPICAL | Status: DC | PRN
  Administered 2020-10-28: 1000 mL

## 2020-10-28 MED ORDER — ONDANSETRON HCL 4 MG/2ML IJ SOLN
INTRAMUSCULAR | Status: AC
  Filled 2020-10-28: qty 2

## 2020-10-28 MED ORDER — LIDOCAINE 2% (20 MG/ML) 5 ML SYRINGE
INTRAMUSCULAR | Status: DC | PRN
  Administered 2020-10-28: 100 mg via INTRAVENOUS

## 2020-10-28 MED ORDER — CHLORHEXIDINE GLUCONATE 4 % EX LIQD: 60.0000 mL | Freq: Once | CUTANEOUS | Status: DC

## 2020-10-28 MED ORDER — LIDOCAINE 2% (20 MG/ML) 5 ML SYRINGE
INTRAMUSCULAR | Status: AC
  Filled 2020-10-28: qty 5

## 2020-10-28 MED ORDER — FENTANYL CITRATE (PF) 250 MCG/5ML IJ SOLN
INTRAMUSCULAR | Status: AC
  Filled 2020-10-28: qty 5

## 2020-10-28 MED ORDER — EPHEDRINE SULFATE 50 MG/ML IJ SOLN
INTRAMUSCULAR | Status: DC | PRN
  Administered 2020-10-28: 7.5 mg via INTRAVENOUS
  Administered 2020-10-28: 10 mg via INTRAVENOUS

## 2020-10-28 MED ORDER — PHENYLEPHRINE 40 MCG/ML (10ML) SYRINGE FOR IV PUSH (FOR BLOOD PRESSURE SUPPORT)
PREFILLED_SYRINGE | INTRAVENOUS | Status: DC | PRN
  Administered 2020-10-28 (×2): 80 ug via INTRAVENOUS
  Administered 2020-10-28: 160 ug via INTRAVENOUS

## 2020-10-28 MED ORDER — CHLORHEXIDINE GLUCONATE 0.12 % MT SOLN
15.0000 mL | OROMUCOSAL | Status: AC
  Filled 2020-10-28: qty 15

## 2020-10-28 MED ORDER — PROPOFOL 10 MG/ML IV BOLUS
INTRAVENOUS | Status: DC | PRN
  Administered 2020-10-28: 130 mg via INTRAVENOUS

## 2020-10-28 MED ORDER — LIDOCAINE-EPINEPHRINE (PF) 1 %-1:200000 IJ SOLN
INTRAMUSCULAR | Status: DC | PRN
  Administered 2020-10-28: 5 mL

## 2020-10-28 MED ORDER — ONDANSETRON HCL 4 MG/2ML IJ SOLN: 4.0000 mg | Freq: Once | INTRAMUSCULAR | Status: DC | PRN

## 2020-10-28 MED ORDER — ACETAMINOPHEN-CODEINE #2 300-15 MG PO TABS: 1.0000 | ORAL_TABLET | Freq: Three times a day (TID) | ORAL | 0 refills | Status: DC | PRN

## 2020-10-28 MED ORDER — SUCCINYLCHOLINE CHLORIDE 200 MG/10ML IV SOSY
PREFILLED_SYRINGE | INTRAVENOUS | Status: AC
  Filled 2020-10-28: qty 10

## 2020-10-28 MED ORDER — DEXAMETHASONE SODIUM PHOSPHATE 10 MG/ML IJ SOLN
INTRAMUSCULAR | Status: AC
  Filled 2020-10-28: qty 1

## 2020-10-28 SURGICAL SUPPLY — 33 items
CANISTER SUCT 3000ML PPV (MISCELLANEOUS) ×3 IMPLANT
CNTNR URN SCR LID CUP LEK RST (MISCELLANEOUS) ×1 IMPLANT
CONT SPEC 4OZ STRL OR WHT (MISCELLANEOUS) ×2
COTTONBALL LRG STERILE PKG (GAUZE/BANDAGES/DRESSINGS) ×3 IMPLANT
COVER SURGICAL LIGHT HANDLE (MISCELLANEOUS) ×6 IMPLANT
COVER WAND RF STERILE (DRAPES) ×3 IMPLANT
DECANTER SPIKE VIAL GLASS SM (MISCELLANEOUS) ×3 IMPLANT
DERMABOND ADVANCED (GAUZE/BANDAGES/DRESSINGS) ×2
DERMABOND ADVANCED .7 DNX12 (GAUZE/BANDAGES/DRESSINGS) ×1 IMPLANT
DRAPE BRACHIAL (DRAPES) ×3 IMPLANT
DRAPE HALF SHEET 40X57 (DRAPES) ×3 IMPLANT
ELECT REM PT RETURN 9FT ADLT (ELECTROSURGICAL) ×3
ELECTRODE REM PT RTRN 9FT ADLT (ELECTROSURGICAL) ×1 IMPLANT
GLOVE SS BIOGEL STRL SZ 7.5 (GLOVE) ×1 IMPLANT
GLOVE SUPERSENSE BIOGEL SZ 7.5 (GLOVE) ×2
GOWN STRL REUS W/ TWL LRG LVL3 (GOWN DISPOSABLE) ×3 IMPLANT
GOWN STRL REUS W/TWL LRG LVL3 (GOWN DISPOSABLE) ×6
KIT BASIN OR (CUSTOM PROCEDURE TRAY) ×3 IMPLANT
KIT TURNOVER KIT B (KITS) ×3 IMPLANT
LOOP VESSEL MAXI BLUE (MISCELLANEOUS) ×3 IMPLANT
NEEDLE HYPO 25GX1X1/2 BEV (NEEDLE) ×3 IMPLANT
NS IRRIG 1000ML POUR BTL (IV SOLUTION) ×3 IMPLANT
PACK GENERAL/GYN (CUSTOM PROCEDURE TRAY) ×3 IMPLANT
PAD ARMBOARD 7.5X6 YLW CONV (MISCELLANEOUS) ×6 IMPLANT
SPONGE LAP 4X18 RFD (DISPOSABLE) ×3 IMPLANT
SUT SILK 3 0 (SUTURE) ×2
SUT SILK 3-0 18XBRD TIE 12 (SUTURE) ×1 IMPLANT
SUT VIC AB 3-0 SH 27 (SUTURE) ×2
SUT VIC AB 3-0 SH 27X BRD (SUTURE) ×1 IMPLANT
SUT VICRYL 4-0 PS2 18IN ABS (SUTURE) ×3 IMPLANT
SYR CONTROL 10ML LL (SYRINGE) ×3 IMPLANT
TOWEL GREEN STERILE (TOWEL DISPOSABLE) ×3 IMPLANT
WATER STERILE IRR 1000ML POUR (IV SOLUTION) IMPLANT

## 2020-10-28 NOTE — Op Note (Addendum)
    OPERATIVE REPORT  DATE OF SURGERY: 10/28/2020  PATIENT: GRADIE OHM, 60 y.o. male MRN: [id number]  DOB: 1960-10-07  PRE-OPERATIVE DIAGNOSIS: Headache, possible temporal arteritis  POST-OPERATIVE DIAGNOSIS:  Same  PROCEDURE: Right temporal artery biopsy  SURGEON:  Curt Jews, M.D.  PHYSICIAN ASSISTANT: Nurse  The assistant was needed for exposure and to expedite the case  ANESTHESIA: General  EBL: per anesthesia record  No intake/output data recorded.  BLOOD ADMINISTERED: none  DRAINS: none  SPECIMEN: Right temporal artery  COUNTS CORRECT:  YES  PATIENT DISPOSITION:  PACU - hemodynamically stable  PROCEDURE DETAILS: Patient was taken everyplace to position with area of the preauricular area was prepped and draped you sterile fashion.  Patient was under general anesthesia but 1% lidocaine with epinephrine local was used to anesthetize the skin.  Incision was made over the temporal artery pulse and carried down through the subcutaneous fat to the level of the temporal artery.  The artery was tortuous.  The artery was ligated proximally and distally and a segment of approximately 3 cm of temporal artery was resected.  Wounds irrigated with saline.  Hemostasis to electrocautery.  The wounds were closed with 3-0 Vicryl in the subcutaneous tissue and the skin was closed with 4-0 subcuticular Vicryl stitch.  Dermabond was placed over the incision the patient was transferred to the recovery in stable condition   Rosetta Posner, M.D., [facility] 10/28/2020 11:20 AM  Note: Portions of this report may have been transcribed using voice recognition software.  Every effort has been made to ensure accuracy; however, inadvertent computerized transcription errors may still be present.

## 2020-10-28 NOTE — Anesthesia Postprocedure Evaluation (Signed)
Anesthesia Post Note  Patient: Charles Mathews  Procedure(s) Performed: BIOPSY TEMPORAL ARTERY (N/A )     Patient location during evaluation: PACU Anesthesia Type: General Level of consciousness: awake and alert, awake and oriented Pain management: pain level controlled Vital Signs Assessment: post-procedure vital signs reviewed and stable Respiratory status: spontaneous breathing, nonlabored ventilation, respiratory function stable and patient connected to nasal cannula oxygen Cardiovascular status: blood pressure returned to baseline and stable Postop Assessment: no apparent nausea or vomiting Anesthetic complications: no   No complications documented.  Last Vitals:  Vitals:   10/28/20 1230 10/28/20 1245  BP: (!) 116/59   Pulse: 91   Resp: 17   Temp:  36.7 C  SpO2: 96%     Last Pain:  Vitals:   10/28/20 1245  TempSrc:   PainSc: 0-No pain                 Catalina Gravel

## 2020-10-28 NOTE — Interval H&P Note (Signed)
History and Physical Interval Note:  10/28/2020 9:42 AM  Charles Mathews  has presented today for surgery, with the diagnosis of OTHER COMPLICATED HEADACHE SYNDROME.  The various methods of treatment have been discussed with the patient and family. After consideration of risks, benefits and other options for treatment, the patient has consented to  Procedure(s): BIOPSY TEMPORAL ARTERY (N/A) as a surgical intervention.  The patient's history has been reviewed, patient examined, no change in status, stable for surgery.  I have reviewed the patient's chart and labs.  Questions were answered to the patient's satisfaction.     Curt Jews

## 2020-10-28 NOTE — Anesthesia Procedure Notes (Signed)
Procedure Name: Intubation Date/Time: 10/28/2020 10:34 AM Performed by: Georgia Duff, CRNA Pre-anesthesia Checklist: Patient identified, Emergency Drugs available, Suction available and Patient being monitored Patient Re-evaluated:Patient Re-evaluated prior to induction Oxygen Delivery Method: Circle System Utilized Preoxygenation: Pre-oxygenation with 100% oxygen Induction Type: IV induction Ventilation: Mask ventilation without difficulty Laryngoscope Size: Miller and 3 Grade View: Grade I Tube type: Oral Tube size: 7.5 mm Number of attempts: 1 Airway Equipment and Method: Stylet and Oral airway Placement Confirmation: ETT inserted through vocal cords under direct vision,  positive ETCO2 and breath sounds checked- equal and bilateral Secured at: 24 cm Tube secured with: Tape Dental Injury: Teeth and Oropharynx as per pre-operative assessment

## 2020-10-28 NOTE — Transfer of Care (Signed)
Immediate Anesthesia Transfer of Care Note  Patient: Charles Mathews  Procedure(s) Performed: BIOPSY TEMPORAL ARTERY (N/A )  Patient Location: PACU  Anesthesia Type:General  Level of Consciousness: drowsy and patient cooperative  Airway & Oxygen Therapy: Patient Spontanous Breathing and Patient connected to face mask oxygen  Post-op Assessment: Report given to RN and Post -op Vital signs reviewed and stable  Post vital signs: Reviewed and stable  Last Vitals:  Vitals Value Taken Time  BP 121/58 10/28/20 1244  Temp 36.7 C 10/28/20 1245  Pulse 94 10/28/20 1248  Resp 19 10/28/20 1248  SpO2 96 % 10/28/20 1248  Vitals shown include unvalidated device data.  Last Pain:  Vitals:   10/28/20 1245  TempSrc:   PainSc: 0-No pain      Patients Stated Pain Goal: 0 (21/78/37 5423)  Complications: No complications documented.

## 2020-10-29 ENCOUNTER — Encounter (HOSPITAL_COMMUNITY): Payer: Self-pay | Admitting: Vascular Surgery

## 2020-10-30 LAB — SURGICAL PATHOLOGY

## 2020-11-18 ENCOUNTER — Ambulatory Visit: Payer: Medicare Other | Admitting: Neurology

## 2020-11-18 DIAGNOSIS — N200 Calculus of kidney: Secondary | ICD-10-CM | POA: Diagnosis not present

## 2020-11-18 DIAGNOSIS — E559 Vitamin D deficiency, unspecified: Secondary | ICD-10-CM | POA: Diagnosis not present

## 2020-11-18 DIAGNOSIS — R519 Headache, unspecified: Secondary | ICD-10-CM | POA: Diagnosis not present

## 2020-11-18 DIAGNOSIS — Z1212 Encounter for screening for malignant neoplasm of rectum: Secondary | ICD-10-CM | POA: Diagnosis not present

## 2020-11-18 DIAGNOSIS — I10 Essential (primary) hypertension: Secondary | ICD-10-CM | POA: Diagnosis not present

## 2020-11-18 DIAGNOSIS — J449 Chronic obstructive pulmonary disease, unspecified: Secondary | ICD-10-CM | POA: Diagnosis not present

## 2020-11-18 DIAGNOSIS — E1165 Type 2 diabetes mellitus with hyperglycemia: Secondary | ICD-10-CM | POA: Diagnosis not present

## 2020-11-18 DIAGNOSIS — E7800 Pure hypercholesterolemia, unspecified: Secondary | ICD-10-CM | POA: Diagnosis not present

## 2020-11-18 DIAGNOSIS — Z0001 Encounter for general adult medical examination with abnormal findings: Secondary | ICD-10-CM | POA: Diagnosis not present

## 2020-11-18 NOTE — Progress Notes (Signed)
NEUROLOGY CONSULTATION NOTE  Charles Mathews MRN: [id number] DOB: Jan 18, 1961  Referring provider: Josephine Igo, MD Primary care provider: Josephine Igo, MD  Reason for consult:  headache  Assessment/Plan:   1.  Chronic migraines with and without aura - complicated by medication overuse, caffeine overuse and uncontrolled OSA 2.  Morbid obesity  1.  Start [employer].  Already on beta blocker.  Already on an antidepressant and wouldn't want to add an additional antidepressant due to past psychiatric history.  Wouldn't increase topiramate as he has history of recurrent kidney stones, including one now that is symptomatic and requires treatment. 2.  Limit Tylenol to no more than 2 days out of week to prevent rebound headache.  Zofran for nausea 3.  Keep headache diary 4.  Discontinue caffeine 5.  Work on weight loss and management for OSA 6.  Follow up in 6 months.  Subjective:  Charles Mathews is a 61 year old left-handed male with HTN, CAD, COPD, depression, bipolar disorder and history of childhood seizures who presents for headaches.  History supplemented by referring provider's note.  He reports history of headaches since his 62s.  They are severe left temporal or right parietal pressure in.  Associated nausea, photophobia, phonophobia, visual disturbance (may see a house that isn't there), sometimes numbness over the scalp.  Last 20 minutes with Tylenol and rest but will return later that evening.  They occur daily.  No known triggers.  He takes Tylenol twice daily.    Current NSAIDS/analgesics:  Tylenol, ASA 37m,  Current triptans:  none Current ergotamine:  none Current anti-emetic:  none Current muscle relaxants:  none Current Antihypertensive medications:  Metoprolol, candestartan, furosemide Current Antidepressant medications: sertraline 1026mQD, trazodone Current Anticonvulsant medications:  topiramate 5077mHS (has some facial numbness) Current anti-CGRP:   none Current Vitamins/Herbal/Supplements:  K Current Antihistamines/Decongestants:  Zyrtec Other therapy:  rest Hormone/birth control:  none Other medications:  Paliperidone, benztropine, stiolto Respimat, Xanax, albuterol, O2,  Past NSAIDS/analgesics:  Naproxen 500m73mioricet with codeine, Tylenol with codeine Past abortive triptans:  none Past abortive ergotamine:  none Past muscle relaxants:  none Past anti-emetic:  none Past antihypertensive medications:  none Past antidepressant medications:  Does not remember Past anticonvulsant medications:  none Past anti-CGRP:  none Past vitamins/Herbal/Supplements:  none Past antihistamines/decongestants:  none Other past therapies:  none  Caffeine:  Sodas, diet peach tea daily.  No coffee Diet:  Very little water.  Sometimes may skip meals. Exercise:  No.  Sometimes uses the stationary bike Depression:  yes; Anxiety:  yes Other pain:  Joint pain/arthritis, neuropathy Sleep:  poor Family history of headache:  Adopted.    10/28/2020 LABS:  CBC with WBC 11.1, HGB 13, HCT 43.6, PLT 102; BMP with Na 140, K 4.6, Cl 107, CO2 29, glucose 121, BUN 22, Cr 1.21, Ca 8.4, GFR >60  PAST MEDICAL HISTORY: Past Medical History:  Diagnosis Date  . Anxiety   . Arthritis   . Asthma    Albuterol prn  . Bipolar disorder (HCC)[place]. Chronic back pain   . COPD (chronic obstructive pulmonary disease) (HCC)[place]. Coronary artery disease    11/01/12 he denies known CAD/MI/CHF history  . Diabetes mellitus    borderline  . Gastric ulcer   . GERD (gastroesophageal reflux disease)    takes Prilosec daily  . Head injury   . High cholesterol    takes zocor daily  . History of kidney stones   .  Hypertension    takes metoprolol daily and cardura  . Insomnia   . Joint pain   . Lung mass   . Major depression    takes Zoloft and effexor daily  . Migraine    last one - 10/15/20 takes Topamax daily  . Morbid obesity ([place])   . Neuropathy   . Pre-diabetes    . Seizures ([place])     10/27/20- last one age [age]  . Shortness of breath    with exertion  . Urinary frequency   . Urinary urgency     PAST SURGICAL HISTORY: Past Surgical History:  Procedure Laterality Date  . ARTERY BIOPSY N/A 10/28/2020   Procedure: BIOPSY TEMPORAL ARTERY;  Surgeon: Rosetta Posner, MD;  Location: [facility];  Service: Vascular;  Laterality: N/A;  . LITHOTRIPSY  2009  . MULTIPLE EXTRACTIONS WITH ALVEOLOPLASTY N/A 12/17/2012   Procedure: MULTIPLE EXTRACTION WITH ALVEOLOPLASTY;  Surgeon: Gae Bon, DDS;  Location: [facility];  Service: Oral Surgery;  Laterality: N/A;    MEDICATIONS: Current Outpatient Medications on File Prior to Visit  Medication Sig Dispense Refill  . ACCU-CHEK GUIDE test strip USE ONCE DAILY TO TEST SUGAR    . Accu-Chek Softclix Lancets lancets USE TO TEST ONCE DAILY    . acetaminophen (TYLENOL) 650 MG CR tablet Take 1,300 mg by mouth every 8 (eight) hours as needed for pain.    Marland Kitchen albuterol (VENTOLIN HFA) 108 (90 Base) MCG/ACT inhaler Inhale 1 puff into the lungs every 6 (six) hours as needed for wheezing or shortness of breath.    Marland Kitchen aspirin EC 81 MG tablet Take 81 mg by mouth daily.    . benztropine (COGENTIN) 1 MG tablet Take 1 mg by mouth daily.    . Blood Glucose Monitoring Suppl (ACCU-CHEK GUIDE) w/Device KIT USE ONCE DAILY TO TEST SUAGR    . Budeson-Glycopyrrol-Formoterol (BREZTRI AEROSPHERE) 160-9-4.8 MCG/ACT AERO Inhale 2 puffs into the lungs in the morning and at bedtime.    . candesartan (ATACAND) 8 MG tablet Take 8 mg by mouth daily.    . cetirizine (ZYRTEC) 10 MG tablet Take 10 mg by mouth daily.    Marland Kitchen doxazosin (CARDURA) 2 MG tablet TAKE 1 TABLET BY MOUTH DAILY (Patient taking differently: Take 2 mg by mouth daily.) 30 tablet 6  . furosemide (LASIX) 40 MG tablet TAKE 1 TABLET BY MOUTH DAILY (Patient taking differently: Take 40 mg by mouth daily.) 90 tablet 1  . Melatonin 10 MG TABS Take 20 mg by mouth at bedtime.    . metFORMIN (GLUCOPHAGE)  500 MG tablet Take 500 mg by mouth daily.    . metoprolol tartrate (LOPRESSOR) 50 MG tablet TAKE 1 TABLET BY MOUTH TWICE DAILY (Patient taking differently: Take 50 mg by mouth 2 (two) times daily.) 60 tablet 6  . Multiple Vitamin (MULTIVITAMIN) tablet Take 2 tablets by mouth daily. Gummies    . omeprazole (PRILOSEC) 20 MG capsule Take 20 mg by mouth daily.    . paliperidone (INVEGA) 3 MG 24 hr tablet Take 3 mg by mouth daily.    . potassium chloride SA (KLOR-CON) 20 MEQ tablet TAKE 1 TABLET BY MOUTH DAILY (Patient taking differently: Take 20 mEq by mouth daily.) 90 tablet 1  . sertraline (ZOLOFT) 100 MG tablet Take 100 mg by mouth at bedtime.    . simvastatin (ZOCOR) 40 MG tablet TAKE 1 TABLET BY MOUTH DAILY (Patient taking differently: Take 40 mg by mouth daily.) 30 tablet 6  . topiramate (  TOPAMAX) 50 MG tablet Take 50 mg by mouth daily.    . traZODone (DESYREL) 100 MG tablet Take 100 mg by mouth at bedtime.     No current facility-administered medications on file prior to visit.    ALLERGIES: Allergies  Allergen Reactions  . Bee Venom Anaphylaxis  . Benadryl [Diphenhydramine]     Makes him feel weird, like he is going to pass out  . Lorazepam Other (See Comments)    Makes him feel woozy.  Marland Kitchen Penicillins Other (See Comments)    CHILDHOOD ALLERGY  . Latex Rash    FAMILY HISTORY: Family History  Adopted: Yes    Objective:  Blood pressure (!) 118/57, pulse (!) 124, height '5\' 10"'  (1.778 m), weight (!) 328 lb (148.8 kg), SpO2 (!) 88 %.  General: No acute distress.  Patient appears well-groomed.   Head:  Normocephalic/atraumatic Eyes:  fundi examined but not visualized Neck: supple, no paraspinal tenderness, full range of motion Back: No paraspinal tenderness Heart: regular rate and rhythm Lungs: Clear to auscultation bilaterally. Vascular: No carotid bruits. Neurological Exam: Mental status: alert and oriented to person, place, and time; speech fluent and not dysarthric,  language intact. Cranial nerves: CN I: not tested CN II: pupils equal, round and reactive to light, visual fields intact CN III, IV, VI:  full range of motion, no nystagmus, no ptosis CN V: facial sensation numb bilaterally. CN VII: upper and lower face symmetric CN VIII: hearing intact CN IX, X: gag intact, uvula midline CN XI: sternocleidomastoid and trapezius muscles intact CN XII: tongue midline Bulk & Tone: normal, no fasciculations. Motor:  muscle strength 5/5 throughout Sensation:  Pinprick, temperature and vibratory sensation intact. Deep Tendon Reflexes:  1+ throughout,  toes downgoing.   Finger to nose testing:  Without dysmetria.     Gait:  Wide-based gait.  Romberg negative..    Thank you for allowing me to take part in the care of this patient.  Metta Clines, DO  CC: Josephine Igo, MD

## 2020-11-19 ENCOUNTER — Other Ambulatory Visit: Payer: Self-pay

## 2020-11-19 ENCOUNTER — Encounter: Payer: Self-pay | Admitting: Neurology

## 2020-11-19 ENCOUNTER — Ambulatory Visit (INDEPENDENT_AMBULATORY_CARE_PROVIDER_SITE_OTHER): Payer: Medicare Other | Admitting: Neurology

## 2020-11-19 VITALS — BP 118/57 | HR 124 | Ht 70.0 in | Wt 328.0 lb

## 2020-11-19 DIAGNOSIS — G43109 Migraine with aura, not intractable, without status migrainosus: Secondary | ICD-10-CM | POA: Diagnosis not present

## 2020-11-19 DIAGNOSIS — G4440 Drug-induced headache, not elsewhere classified, not intractable: Secondary | ICD-10-CM | POA: Diagnosis not present

## 2020-11-19 DIAGNOSIS — G4733 Obstructive sleep apnea (adult) (pediatric): Secondary | ICD-10-CM | POA: Diagnosis not present

## 2020-11-19 MED ORDER — ONDANSETRON 4 MG PO TBDP: 4.0000 mg | ORAL_TABLET | Freq: Three times a day (TID) | ORAL | 5 refills | Status: DC | PRN

## 2020-11-19 MED ORDER — EMGALITY 120 MG/ML ~~LOC~~ SOAJ: 240.0000 mg | Freq: Once | SUBCUTANEOUS | 0 refills | Status: AC

## 2020-11-19 MED ORDER — EMGALITY 120 MG/ML ~~LOC~~ SOAJ: 120.0000 mg | SUBCUTANEOUS | 5 refills | Status: DC

## 2020-11-19 NOTE — Patient Instructions (Signed)
  1. Will start Emgality injection.  2 injections for first dose then 1 injection every 28 days thereafter.  Continue topiramate 50mg  at bedtime for now 2. Limit use of Tylenol to no more than 2 days out of the week.  These medications include acetaminophen, NSAIDs (ibuprofen/Advil/Motrin, naproxen/Aleve, triptans (Imitrex/sumatriptan), Excedrin, and narcotics.  This will help reduce risk of rebound headaches. 3. Ondansetron for nausea 4. Be aware of common food triggers:  - Caffeine:  coffee, black tea, cola, Mt. Dew  STOP ALL CAFFEINE  - Chocolate  - Dairy:  aged cheeses (brie, blue, cheddar, gouda, Parmasan, provolone, [place], Swiss, etc), chocolate milk, buttermilk, sour cream, limit eggs and yogurt  - Nuts, peanut butter  - Alcohol  - Cereals/grains:  FRESH breads (fresh bagels, sourdough, doughnuts), yeast productions  - Processed/canned/aged/cured meats (pre-packaged deli meats, hotdogs)  - MSG/glutamate:  soy sauce, flavor enhancer, pickled/preserved/marinated foods  - Sweeteners:  aspartame (Equal, Nutrasweet).  Sugar and Splenda are okay  - Vegetables:  legumes (lima beans, lentils, snow peas, fava beans, pinto peans, peas, garbanzo beans), sauerkraut, onions, olives, pickles  - Fruit:  avocados, bananas, citrus fruit (orange, lemon, grapefruit), mango  - Other:  Frozen meals, macaroni and cheese 5. Routine exercise 6. Stay adequately hydrated (aim for 64 oz water daily) 7. Keep headache diary 8. Maintain proper stress management 9. Maintain proper sleep hygiene - try to get sleep apnea controlled.  Lose weight. 10. Do not skip meals 11. Consider supplements:  magnesium citrate 400mg  daily, riboflavin 400mg  daily, coenzyme Q10 100mg  three times daily.

## 2020-11-19 NOTE — Progress Notes (Signed)
Jance Gau KeyAleatha Borer - PA Case ID: [id number] - Rx #: [id number] Need help? Call us at [phone] Outcome Approvedtoday Request Reference Number: [id number]. EMGALITY INJ 120MG /ML is approved through 07/31/2021. Your patient may now fill this prescription and it will be covered. Drug Emgality 120MG /ML auto-injectors (migraine) Form [employer] Medicare Part D Electronic Prior Authorization Form (2017 NCPDP) Original Claim Info [phone]

## 2020-11-20 DIAGNOSIS — E1122 Type 2 diabetes mellitus with diabetic chronic kidney disease: Secondary | ICD-10-CM | POA: Diagnosis not present

## 2020-11-20 DIAGNOSIS — E785 Hyperlipidemia, unspecified: Secondary | ICD-10-CM | POA: Diagnosis not present

## 2020-11-20 DIAGNOSIS — E1169 Type 2 diabetes mellitus with other specified complication: Secondary | ICD-10-CM | POA: Diagnosis not present

## 2020-11-20 DIAGNOSIS — I129 Hypertensive chronic kidney disease with stage 1 through stage 4 chronic kidney disease, or unspecified chronic kidney disease: Secondary | ICD-10-CM | POA: Diagnosis not present

## 2020-11-20 DIAGNOSIS — J411 Mucopurulent chronic bronchitis: Secondary | ICD-10-CM | POA: Diagnosis not present

## 2020-11-20 DIAGNOSIS — E119 Type 2 diabetes mellitus without complications: Secondary | ICD-10-CM | POA: Diagnosis not present

## 2020-11-20 DIAGNOSIS — J449 Chronic obstructive pulmonary disease, unspecified: Secondary | ICD-10-CM | POA: Diagnosis not present

## 2020-11-20 DIAGNOSIS — E559 Vitamin D deficiency, unspecified: Secondary | ICD-10-CM | POA: Diagnosis not present

## 2020-11-25 NOTE — Patient Instructions (Signed)
Charles Mathews  11/25/2020     @PREFPERIOPPHARMACY @   Your procedure is scheduled on  11/30/2020.   Report to [facility] at  [phone]  A.M.   Call this number if you have problems the morning of surgery:  [phone]   Remember:  Do not eat or drink after midnight.                       Take these medicines the morning of surgery with A SIP OF WATER  Cogentin, zyrtec, cardura, metoprolol, omeprazole, zofran (if needed), invega, topamax.  Use your inhaler before you come and bring your rescue inhaler with you.  DO NOT take any medications for diabetes the morning of your procedure.  If your glucose is 70 or below the morning of your procedure, drink 1/2 cup of clear liquid containing sugar and recheck your glucose in 15 minutes. If your glucose is still 70 or below, call [phone] for instructions.  If your glucose is 300 or above the morning of your procedure, call [phone] for instructions.       Do not wear jewelry, make-up or nail polish.  Do not wear lotions, powders, or perfumes, or deodorant.  Do not shave 48 hours prior to surgery.  Men may shave face and neck.  Do not bring valuables to the hospital.  [facility] is not responsible for any belongings or valuables.  Contacts, dentures or bridgework may not be worn into surgery.  Leave your suitcase in the car.  After surgery it may be brought to your room.  For patients admitted to the hospital, discharge time will be determined by your treatment team.  Patients discharged the day of surgery will not be allowed to drive home and must have someone with them for 24 hours.  Place clean sheets on your bed the night before your procedure and DO NOT sleep with pets this night.  Shower with CHG the night before and the morning of your procedure. DO NOT use CHG on your face, hair or genitals.  After each shower, dry off with a clean towel, put on clean, comfortable clothes and brush your  teeth.     Special instructions:  DO NOT smoke tobacco or vape for 24 hours before your procedure.    Please read over the following fact sheets that you were given. Coughing and Deep Breathing, Surgical Site Infection Prevention, Anesthesia Post-op Instructions and Care and Recovery After Surgery       Ureteral Stent Implantation, Care After This sheet gives you information about how to care for yourself after your procedure. Your health care provider may also give you more specific instructions. If you have problems or questions, contact your health care provider. What can I expect after the procedure? After the procedure, it is common to have:  Nausea.  Mild pain when you urinate. You may feel this pain in your lower back or lower abdomen. The pain should stop within a few minutes after you urinate. This may last for up to 1 week.  A small amount of blood in your urine for several days. Follow these instructions at home: Medicines  Take over-the-counter and prescription medicines only as told by your health care provider.  If you were prescribed an antibiotic medicine, take it as told by your health care provider. Do not stop taking the antibiotic even if you start to feel better.  Do not drive for 24 hours  if you were given a sedative during your procedure.  Ask your health care provider if the medicine prescribed to you requires you to avoid driving or using heavy machinery. Activity  Rest as told by your health care provider.  Avoid sitting for a long time without moving. Get up to take short walks every 1-2 hours. This is important to improve blood flow and breathing. Ask for help if you feel weak or unsteady.  Return to your normal activities as told by your health care provider. Ask your health care provider what activities are safe for you. General instructions  Watch for any blood in your urine. Call your health care provider if the amount of blood in your urine  increases.  If you have a catheter: ? Follow instructions from your health care provider about taking care of your catheter and collection bag. ? Do not take baths, swim, or use a hot tub until your health care provider approves. Ask your health care provider if you may take showers. You may only be allowed to take sponge baths.  Drink enough fluid to keep your urine pale yellow.  Do not use any products that contain nicotine or tobacco, such as cigarettes, e-cigarettes, and chewing tobacco. These can delay healing after surgery. If you need help quitting, ask your health care provider.  Keep all follow-up visits as told by your health care provider. This is important.   Contact a health care provider if:  You have pain that gets worse or does not get better with medicine, especially pain when you urinate.  You have difficulty urinating.  You feel nauseous or you vomit repeatedly during a period of more than 2 days after the procedure. Get help right away if:  Your urine is dark red or has blood clots in it.  You are leaking urine (have incontinence).  The end of the stent comes out of your urethra.  You cannot urinate.  You have sudden, sharp, or severe pain in your abdomen or lower back.  You have a fever.  You have swelling or pain in your legs.  You have difficulty breathing. Summary  After the procedure, it is common to have mild pain when you urinate that goes away within a few minutes after you urinate. This may last for up to 1 week.  Watch for any blood in your urine. Call your health care provider if the amount of blood in your urine increases.  Take over-the-counter and prescription medicines only as told by your health care provider.  Drink enough fluid to keep your urine pale yellow. This information is not intended to replace advice given to you by your health care provider. Make sure you discuss any questions you have with your health care  provider. Document Revised: 04/24/2018 Document Reviewed: 04/25/2018 Elsevier Patient Education  2021 [place] After This sheet gives you information about how to care for yourself after your procedure. Your health care provider may also give you more specific instructions. If you have problems or questions, contact your health care provider. What can I expect after the procedure? After the procedure, it is common to have:  Tiredness.  Forgetfulness about what happened after the procedure.  Impaired judgment for important decisions.  Nausea or vomiting.  Some difficulty with balance. Follow these instructions at home: For the time period you were told by your health care provider:  Rest as needed.  Do not participate in activities where you could fall  or become injured.  Do not drive or use machinery.  Do not drink alcohol.  Do not take sleeping pills or medicines that cause drowsiness.  Do not make important decisions or sign legal documents.  Do not take care of children on your own.      Eating and drinking  Follow the diet that is recommended by your health care provider.  Drink enough fluid to keep your urine pale yellow.  If you vomit: ? Drink water, juice, or soup when you can drink without vomiting. ? Make sure you have little or no nausea before eating solid foods. General instructions  Have a responsible adult stay with you for the time you are told. It is important to have someone help care for you until you are awake and alert.  Take over-the-counter and prescription medicines only as told by your health care provider.  If you have sleep apnea, surgery and certain medicines can increase your risk for breathing problems. Follow instructions from your health care provider about wearing your sleep device: ? Anytime you are sleeping, including during daytime naps. ? While taking prescription pain medicines, sleeping  medicines, or medicines that make you drowsy.  Avoid smoking.  Keep all follow-up visits as told by your health care provider. This is important. Contact a health care provider if:  You keep feeling nauseous or you keep vomiting.  You feel light-headed.  You are still sleepy or having trouble with balance after 24 hours.  You develop a rash.  You have a fever.  You have redness or swelling around the IV site. Get help right away if:  You have trouble breathing.  You have new-onset confusion at home. Summary  For several hours after your procedure, you may feel tired. You may also be forgetful and have poor judgment.  Have a responsible adult stay with you for the time you are told. It is important to have someone help care for you until you are awake and alert.  Rest as told. Do not drive or operate machinery. Do not drink alcohol or take sleeping pills.  Get help right away if you have trouble breathing, or if you suddenly become confused. This information is not intended to replace advice given to you by your health care provider. Make sure you discuss any questions you have with your health care provider. Document Revised: 04/02/2020 Document Reviewed: 06/20/2019 Elsevier Patient Education  2021 [employer].

## 2020-11-27 ENCOUNTER — Encounter (HOSPITAL_COMMUNITY)
Admission: RE | Admit: 2020-11-27 | Discharge: 2020-11-27 | Disposition: A | Discharge status: Home / Self Care | Payer: Medicare Other | Source: Ambulatory Visit | Attending: Urology | Admitting: Urology

## 2020-11-27 ENCOUNTER — Other Ambulatory Visit (HOSPITAL_COMMUNITY)
Admission: RE | Admit: 2020-11-27 | Discharge: 2020-11-27 | Disposition: A | Discharge status: Home / Self Care | Payer: Medicare Other | Source: Ambulatory Visit | Attending: Urology | Admitting: Urology

## 2020-11-27 ENCOUNTER — Encounter (HOSPITAL_COMMUNITY): Payer: Self-pay

## 2020-11-27 ENCOUNTER — Other Ambulatory Visit: Payer: Self-pay

## 2020-11-27 DIAGNOSIS — Z20822 Contact with and (suspected) exposure to covid-19: Secondary | ICD-10-CM | POA: Diagnosis not present

## 2020-11-27 DIAGNOSIS — Z01812 Encounter for preprocedural laboratory examination: Secondary | ICD-10-CM | POA: Insufficient documentation

## 2020-11-27 HISTORY — DX: Unspecified intellectual disabilities: F79

## 2020-11-27 LAB — CBC WITH DIFFERENTIAL/PLATELET
Abs Immature Granulocytes: 0.09 10*3/uL — ABNORMAL HIGH (ref 0.00–0.07)
Basophils Absolute: 0.1 10*3/uL (ref 0.0–0.1)
Basophils Relative: 1 %
Eosinophils Absolute: 0.2 10*3/uL (ref 0.0–0.5)
Eosinophils Relative: 2 %
HCT: 45.5 % (ref 39.0–52.0)
Hemoglobin: 13.7 g/dL (ref 13.0–17.0)
Immature Granulocytes: 1 %
Lymphocytes Relative: 20 %
Lymphs Abs: 1.8 10*3/uL (ref 0.7–4.0)
MCH: 29.8 pg (ref 26.0–34.0)
MCHC: 30.1 g/dL (ref 30.0–36.0)
MCV: 99.1 fL (ref 80.0–100.0)
Monocytes Absolute: 0.9 10*3/uL (ref 0.1–1.0)
Monocytes Relative: 9 %
Neutro Abs: 6.2 10*3/uL (ref 1.7–7.7)
Neutrophils Relative %: 67 %
Platelets: 130 10*3/uL — ABNORMAL LOW (ref 150–400)
RBC: 4.59 MIL/uL (ref 4.22–5.81)
RDW: 14.8 % (ref 11.5–15.5)
WBC: 9.1 10*3/uL (ref 4.0–10.5)
nRBC: 0 % (ref 0.0–0.2)

## 2020-11-27 LAB — BASIC METABOLIC PANEL
Anion gap: 7 (ref 5–15)
BUN: 21 mg/dL — ABNORMAL HIGH (ref 6–20)
CO2: 28 mmol/L (ref 22–32)
Calcium: 8.7 mg/dL — ABNORMAL LOW (ref 8.9–10.3)
Chloride: 104 mmol/L (ref 98–111)
Creatinine, Ser: 1.25 mg/dL — ABNORMAL HIGH (ref 0.61–1.24)
GFR, Estimated: >60 mL/min (ref >60)
Glucose, Bld: 122 mg/dL — ABNORMAL HIGH (ref 70–99)
Potassium: 5.4 mmol/L — ABNORMAL HIGH (ref 3.5–5.1)
Sodium: 139 mmol/L (ref 135–145)

## 2020-11-27 LAB — HEMOGLOBIN A1C
Hgb A1c MFr Bld: 6.4 % — ABNORMAL HIGH (ref 4.8–5.6)
Mean Plasma Glucose: 136.98 mg/dL

## 2020-11-28 DIAGNOSIS — I10 Essential (primary) hypertension: Secondary | ICD-10-CM | POA: Diagnosis not present

## 2020-11-28 DIAGNOSIS — J441 Chronic obstructive pulmonary disease with (acute) exacerbation: Secondary | ICD-10-CM | POA: Diagnosis not present

## 2020-11-28 DIAGNOSIS — I2510 Atherosclerotic heart disease of native coronary artery without angina pectoris: Secondary | ICD-10-CM | POA: Diagnosis not present

## 2020-11-28 DIAGNOSIS — Z87891 Personal history of nicotine dependence: Secondary | ICD-10-CM | POA: Diagnosis not present

## 2020-11-28 LAB — SARS CORONAVIRUS 2 (TAT 6-24 HRS): SARS Coronavirus 2: NEGATIVE

## 2020-11-30 ENCOUNTER — Ambulatory Visit (HOSPITAL_COMMUNITY)
Admission: RE | Admit: 2020-11-30 | Discharge: 2020-11-30 | Disposition: A | Discharge status: Home / Self Care | Payer: Medicare Other | Attending: Urology | Admitting: Urology

## 2020-11-30 ENCOUNTER — Other Ambulatory Visit: Payer: Self-pay

## 2020-11-30 ENCOUNTER — Ambulatory Visit (HOSPITAL_COMMUNITY): Payer: Medicare Other | Admitting: Anesthesiology

## 2020-11-30 ENCOUNTER — Encounter (HOSPITAL_COMMUNITY)
Admission: RE | Disposition: A | Discharge status: Home / Self Care | Payer: Self-pay | Source: Home / Self Care | Attending: Urology

## 2020-11-30 ENCOUNTER — Encounter (HOSPITAL_COMMUNITY): Payer: Self-pay | Admitting: Urology

## 2020-11-30 ENCOUNTER — Ambulatory Visit (HOSPITAL_COMMUNITY): Payer: Medicare Other

## 2020-11-30 DIAGNOSIS — Z87442 Personal history of urinary calculi: Secondary | ICD-10-CM | POA: Diagnosis not present

## 2020-11-30 DIAGNOSIS — Z87891 Personal history of nicotine dependence: Secondary | ICD-10-CM | POA: Diagnosis not present

## 2020-11-30 DIAGNOSIS — Z91030 Bee allergy status: Secondary | ICD-10-CM | POA: Diagnosis not present

## 2020-11-30 DIAGNOSIS — Z91040 Latex allergy status: Secondary | ICD-10-CM | POA: Insufficient documentation

## 2020-11-30 DIAGNOSIS — N201 Calculus of ureter: Secondary | ICD-10-CM | POA: Insufficient documentation

## 2020-11-30 DIAGNOSIS — Z79899 Other long term (current) drug therapy: Secondary | ICD-10-CM | POA: Diagnosis not present

## 2020-11-30 DIAGNOSIS — N200 Calculus of kidney: Secondary | ICD-10-CM | POA: Diagnosis not present

## 2020-11-30 DIAGNOSIS — J449 Chronic obstructive pulmonary disease, unspecified: Secondary | ICD-10-CM | POA: Diagnosis not present

## 2020-11-30 DIAGNOSIS — Z880 Allergy status to penicillin: Secondary | ICD-10-CM | POA: Diagnosis not present

## 2020-11-30 HISTORY — PX: CYSTOSCOPY WITH RETROGRADE PYELOGRAM, URETEROSCOPY AND STENT PLACEMENT: SHX5789

## 2020-11-30 LAB — POCT I-STAT, CHEM 8
BUN: 25 mg/dL — ABNORMAL HIGH (ref 6–20)
Calcium, Ion: 1.24 mmol/L (ref 1.15–1.40)
Chloride: 100 mmol/L (ref 98–111)
Creatinine, Ser: 1.2 mg/dL (ref 0.61–1.24)
Glucose, Bld: 117 mg/dL — ABNORMAL HIGH (ref 70–99)
HCT: 40 % (ref 39.0–52.0)
Hemoglobin: 13.6 g/dL (ref 13.0–17.0)
Potassium: 4.3 mmol/L (ref 3.5–5.1)
Sodium: 142 mmol/L (ref 135–145)
TCO2: 31 mmol/L (ref 22–32)

## 2020-11-30 LAB — GLUCOSE, CAPILLARY
Glucose-Capillary: 101 mg/dL — ABNORMAL HIGH (ref 70–99)
Glucose-Capillary: 126 mg/dL — ABNORMAL HIGH (ref 70–99)

## 2020-11-30 SURGERY — CYSTOURETEROSCOPY, WITH RETROGRADE PYELOGRAM AND STENT INSERTION
Anesthesia: General | Site: Ureter | Laterality: Left

## 2020-11-30 MED ORDER — IPRATROPIUM-ALBUTEROL 0.5-2.5 (3) MG/3ML IN SOLN: 3.0000 mL | Freq: Once | RESPIRATORY_TRACT | Status: AC

## 2020-11-30 MED ORDER — LIDOCAINE HCL (PF) 2 % IJ SOLN
INTRAMUSCULAR | Status: AC
  Filled 2020-11-30: qty 5

## 2020-11-30 MED ORDER — MEPERIDINE HCL 50 MG/ML IJ SOLN: 6.2500 mg | INTRAMUSCULAR | Status: DC | PRN

## 2020-11-30 MED ORDER — SUCCINYLCHOLINE CHLORIDE 200 MG/10ML IV SOSY
PREFILLED_SYRINGE | INTRAVENOUS | Status: DC | PRN
  Administered 2020-11-30: 140 mg via INTRAVENOUS

## 2020-11-30 MED ORDER — ROCURONIUM BROMIDE 10 MG/ML (PF) SYRINGE
PREFILLED_SYRINGE | INTRAVENOUS | Status: AC
  Filled 2020-11-30: qty 10

## 2020-11-30 MED ORDER — FENTANYL CITRATE (PF) 100 MCG/2ML IJ SOLN
INTRAMUSCULAR | Status: AC
  Filled 2020-11-30: qty 2

## 2020-11-30 MED ORDER — DEXAMETHASONE SODIUM PHOSPHATE 10 MG/ML IJ SOLN
INTRAMUSCULAR | Status: DC | PRN
  Administered 2020-11-30: 10 mg via INTRAVENOUS

## 2020-11-30 MED ORDER — WATER FOR IRRIGATION, STERILE IR SOLN
Status: DC | PRN
  Administered 2020-11-30: 1000 mL

## 2020-11-30 MED ORDER — ORAL CARE MOUTH RINSE: 15.0000 mL | Freq: Once | OROMUCOSAL | Status: AC

## 2020-11-30 MED ORDER — ONDANSETRON HCL 4 MG/2ML IJ SOLN
INTRAMUSCULAR | Status: DC | PRN
  Administered 2020-11-30: 4 mg via INTRAVENOUS

## 2020-11-30 MED ORDER — EPHEDRINE SULFATE-NACL 50-0.9 MG/10ML-% IV SOSY
PREFILLED_SYRINGE | INTRAVENOUS | Status: DC | PRN
  Administered 2020-11-30 (×2): 5 mg via INTRAVENOUS
  Administered 2020-11-30: 15 mg via INTRAVENOUS
  Administered 2020-11-30: 10 mg via INTRAVENOUS
  Administered 2020-11-30: 5 mg via INTRAVENOUS

## 2020-11-30 MED ORDER — EPHEDRINE 5 MG/ML INJ
INTRAVENOUS | Status: AC
  Filled 2020-11-30: qty 10

## 2020-11-30 MED ORDER — LIDOCAINE 2% (20 MG/ML) 5 ML SYRINGE
INTRAMUSCULAR | Status: DC | PRN
  Administered 2020-11-30: 60 mg via INTRAVENOUS

## 2020-11-30 MED ORDER — CHLORHEXIDINE GLUCONATE 0.12 % MT SOLN: 15.0000 mL | Freq: Once | OROMUCOSAL | Status: AC

## 2020-11-30 MED ORDER — SUGAMMADEX SODIUM 200 MG/2ML IV SOLN
INTRAVENOUS | Status: DC | PRN
  Administered 2020-11-30: 300 mg via INTRAVENOUS

## 2020-11-30 MED ORDER — LACTATED RINGERS IV SOLN: INTRAVENOUS | Status: DC

## 2020-11-30 MED ORDER — PROPOFOL 10 MG/ML IV BOLUS
INTRAVENOUS | Status: AC
  Filled 2020-11-30: qty 20

## 2020-11-30 MED ORDER — DEXAMETHASONE SODIUM PHOSPHATE 10 MG/ML IJ SOLN
INTRAMUSCULAR | Status: AC
  Filled 2020-11-30: qty 1

## 2020-11-30 MED ORDER — SODIUM CHLORIDE 0.9 % IR SOLN
Status: DC | PRN
  Administered 2020-11-30 (×2): 3000 mL

## 2020-11-30 MED ORDER — IPRATROPIUM-ALBUTEROL 0.5-2.5 (3) MG/3ML IN SOLN
RESPIRATORY_TRACT | Status: AC
  Administered 2020-11-30: 3 mL via RESPIRATORY_TRACT
  Filled 2020-11-30: qty 3

## 2020-11-30 MED ORDER — CEFAZOLIN SODIUM-DEXTROSE 2-4 GM/100ML-% IV SOLN
2.0000 g | INTRAVENOUS | Status: AC
  Administered 2020-11-30: 2 g via INTRAVENOUS

## 2020-11-30 MED ORDER — PROPOFOL 10 MG/ML IV BOLUS
INTRAVENOUS | Status: DC | PRN
  Administered 2020-11-30: 150 mg via INTRAVENOUS

## 2020-11-30 MED ORDER — FENTANYL CITRATE (PF) 100 MCG/2ML IJ SOLN
INTRAMUSCULAR | Status: DC | PRN
  Administered 2020-11-30: 50 ug via INTRAVENOUS

## 2020-11-30 MED ORDER — DIATRIZOATE MEGLUMINE 30 % UR SOLN
URETHRAL | Status: DC | PRN
  Administered 2020-11-30: 10 mL via URETHRAL

## 2020-11-30 MED ORDER — DIATRIZOATE MEGLUMINE 30 % UR SOLN
URETHRAL | Status: AC
  Filled 2020-11-30: qty 100

## 2020-11-30 MED ORDER — HYDROMORPHONE HCL 1 MG/ML IJ SOLN
0.2500 mg | INTRAMUSCULAR | Status: DC | PRN
  Administered 2020-11-30: 0.5 mg via INTRAVENOUS
  Filled 2020-11-30 (×2): qty 0.5

## 2020-11-30 MED ORDER — CHLORHEXIDINE GLUCONATE 0.12 % MT SOLN
OROMUCOSAL | Status: AC
  Administered 2020-11-30: 15 mL via OROMUCOSAL
  Filled 2020-11-30: qty 15

## 2020-11-30 MED ORDER — CEFAZOLIN SODIUM-DEXTROSE 2-4 GM/100ML-% IV SOLN
INTRAVENOUS | Status: AC
  Filled 2020-11-30: qty 100

## 2020-11-30 MED ORDER — MIDAZOLAM HCL 2 MG/2ML IJ SOLN
INTRAMUSCULAR | Status: AC
  Filled 2020-11-30: qty 2

## 2020-11-30 MED ORDER — OXYCODONE-ACETAMINOPHEN 5-325 MG PO TABS: 1.0000 | ORAL_TABLET | ORAL | 0 refills | Status: AC | PRN

## 2020-11-30 MED ORDER — ONDANSETRON HCL 4 MG/2ML IJ SOLN: 4.0000 mg | Freq: Once | INTRAMUSCULAR | Status: DC | PRN

## 2020-11-30 MED ORDER — ROCURONIUM BROMIDE 10 MG/ML (PF) SYRINGE
PREFILLED_SYRINGE | INTRAVENOUS | Status: DC | PRN
  Administered 2020-11-30: 50 mg via INTRAVENOUS

## 2020-11-30 SURGICAL SUPPLY — 26 items
BAG DRAIN URO TABLE W/ADPT NS (BAG) ×3 IMPLANT
BAG DRN 8 ADPR NS SKTRN CSTL (BAG) ×2
BAG HAMPER (MISCELLANEOUS) ×3 IMPLANT
CATH INTERMIT  6FR 70CM (CATHETERS) ×3 IMPLANT
CLOTH BEACON ORANGE TIMEOUT ST (SAFETY) ×3 IMPLANT
DECANTER SPIKE VIAL GLASS SM (MISCELLANEOUS) ×3 IMPLANT
EXTRACTOR STONE NITINOL NGAGE (UROLOGICAL SUPPLIES) ×3 IMPLANT
GLOVE BIO SURGEON STRL SZ8 (GLOVE) ×3 IMPLANT
GLOVE SURG UNDER POLY LF SZ7 (GLOVE) ×6 IMPLANT
GOWN STRL REUS W/TWL LRG LVL3 (GOWN DISPOSABLE) ×3 IMPLANT
GOWN STRL REUS W/TWL XL LVL3 (GOWN DISPOSABLE) ×3 IMPLANT
GUIDEWIRE STR DUAL SENSOR (WIRE) ×3 IMPLANT
GUIDEWIRE STR ZIPWIRE 035X150 (MISCELLANEOUS) ×3 IMPLANT
IV NS IRRIG 3000ML ARTHROMATIC (IV SOLUTION) ×6 IMPLANT
KIT TURNOVER CYSTO (KITS) ×3 IMPLANT
MANIFOLD NEPTUNE II (INSTRUMENTS) ×3 IMPLANT
PACK CYSTO (CUSTOM PROCEDURE TRAY) ×3 IMPLANT
PAD ARMBOARD 7.5X6 YLW CONV (MISCELLANEOUS) ×3 IMPLANT
SHEATH URETERAL 12FRX35CM (MISCELLANEOUS) ×3 IMPLANT
STENT URET 6FRX26 CONTOUR (STENTS) ×3 IMPLANT
SYR 10ML LL (SYRINGE) ×3 IMPLANT
SYR CONTROL 10ML LL (SYRINGE) ×3 IMPLANT
TOWEL OR 17X26 4PK STRL BLUE (TOWEL DISPOSABLE) ×3 IMPLANT
TRACTIP FLEXIVA PULS ID 200XHI (Laser) IMPLANT
TRACTIP FLEXIVA PULSE ID 200 (Laser)
WATER STERILE IRR 500ML POUR (IV SOLUTION) ×3 IMPLANT

## 2020-11-30 NOTE — Anesthesia Postprocedure Evaluation (Signed)
Anesthesia Post Note  Patient: Charles Mathews  Procedure(s) Performed: CYSTOSCOPY WITH LEFT RETROGRADE PYELOGRAM, LEFT URETEROSCOPY AND LEFT STENT PLACEMENT (Left Ureter)  Patient location during evaluation: PACU Anesthesia Type: General Level of consciousness: awake and alert and oriented Pain management: pain level controlled Vital Signs Assessment: post-procedure vital signs reviewed and stable Respiratory status: spontaneous breathing, respiratory function stable and patient connected to nasal cannula oxygen Cardiovascular status: blood pressure returned to baseline and stable Postop Assessment: no apparent nausea or vomiting Anesthetic complications: no   No complications documented.   Last Vitals:  Vitals:   11/30/20 1015 11/30/20 1035  BP: 132/89 (!) 146/89  Pulse: 87 66  Resp: 18 18  Temp:  36.8 C  SpO2: 94% 97%    Last Pain:  Vitals:   11/30/20 1035  TempSrc: Oral  PainSc: 0-No pain                 Maisa Bedingfield C Demetrion Wesby

## 2020-11-30 NOTE — Discharge Instructions (Signed)
Ureteral Stent Implantation, Care After This sheet gives you information about how to care for yourself after your procedure. Your health care provider may also give you more specific instructions. If you have problems or questions, contact your health care provider. What can I expect after the procedure? After the procedure, it is common to have:  Nausea.  Mild pain when you urinate. You may feel this pain in your lower back or lower abdomen. The pain should stop within a few minutes after you urinate. This may last for up to 1 week.  A small amount of blood in your urine for several days. Follow these instructions at home: Medicines  Take over-the-counter and prescription medicines only as told by your health care provider.  If you were prescribed an antibiotic medicine, take it as told by your health care provider. Do not stop taking the antibiotic even if you start to feel better.  Do not drive for 24 hours if you were given a sedative during your procedure.  Ask your health care provider if the medicine prescribed to you requires you to avoid driving or using heavy machinery. Activity  Rest as told by your health care provider.  Avoid sitting for a long time without moving. Get up to take short walks every 1-2 hours. This is important to improve blood flow and breathing. Ask for help if you feel weak or unsteady.  Return to your normal activities as told by your health care provider. Ask your health care provider what activities are safe for you. General instructions  Watch for any blood in your urine. Call your health care provider if the amount of blood in your urine increases.  If you have a catheter: ? Follow instructions from your health care provider about taking care of your catheter and collection bag. ? Do not take baths, swim, or use a hot tub until your health care provider approves. Ask your health care provider if you may take showers. You may only be allowed to  take sponge baths.  Drink enough fluid to keep your urine pale yellow.  Do not use any products that contain nicotine or tobacco, such as cigarettes, e-cigarettes, and chewing tobacco. These can delay healing after surgery. If you need help quitting, ask your health care provider.  Keep all follow-up visits as told by your health care provider. This is important.   Contact a health care provider if:  You have pain that gets worse or does not get better with medicine, especially pain when you urinate.  You have difficulty urinating.  You feel nauseous or you vomit repeatedly during a period of more than 2 days after the procedure. Get help right away if:  Your urine is dark red or has blood clots in it.  You are leaking urine (have incontinence).  The end of the stent comes out of your urethra.  You cannot urinate.  You have sudden, sharp, or severe pain in your abdomen or lower back.  You have a fever.  You have swelling or pain in your legs.  You have difficulty breathing. Summary  After the procedure, it is common to have mild pain when you urinate that goes away within a few minutes after you urinate. This may last for up to 1 week.  Watch for any blood in your urine. Call your health care provider if the amount of blood in your urine increases.  Take over-the-counter and prescription medicines only as told by your health care provider.  Drink enough fluid to keep your urine pale yellow. This information is not intended to replace advice given to you by your health care provider. Make sure you discuss any questions you have with your health care provider. Document Revised: 04/24/2018 Document Reviewed: 04/25/2018 Elsevier Patient Education  2021 [employer] Inc.  PLEASE REMOVE YOUR STENT IN 72 HOURS BY GENTLY PULLING THE STRING 

## 2020-11-30 NOTE — H&P (Signed)
Urology Admission H&P  Chief Complaint: left renal calculus  History of Present Illness: Charles Mathews is a 60yo here for left ureteroscopic stone extraction. No flank pain currently. No Luts  Past Medical History:  Diagnosis Date  . Anxiety   . Arthritis   . Asthma    Albuterol prn  . Bipolar disorder ([place])   . Chronic back pain   . COPD (chronic obstructive pulmonary disease) ([place])   . Coronary artery disease    11/01/12 he denies known CAD/MI/CHF history  . Diabetes mellitus    borderline  . Gastric ulcer   . GERD (gastroesophageal reflux disease)    takes Prilosec daily  . Head injury   . High cholesterol    takes zocor daily  . History of kidney stones   . Hypertension    takes metoprolol daily and cardura  . Insomnia   . Joint pain   . Lung mass   . Major depression    takes Zoloft and effexor daily  . Mentally challenged   . Migraine    last one - 10/15/20 takes Topamax daily  . Morbid obesity ([place])   . Neuropathy   . Pre-diabetes   . Seizures ([place])     10/27/20- last one age [age]  . Shortness of breath    with exertion  . Urinary frequency   . Urinary urgency    Past Surgical History:  Procedure Laterality Date  . ARTERY BIOPSY N/A 10/28/2020   Procedure: BIOPSY TEMPORAL ARTERY;  Surgeon: Rosetta Posner, MD;  Location: [facility];  Service: Vascular;  Laterality: N/A;  . LITHOTRIPSY  2009  . MULTIPLE EXTRACTIONS WITH ALVEOLOPLASTY N/A 12/17/2012   Procedure: MULTIPLE EXTRACTION WITH ALVEOLOPLASTY;  Surgeon: Gae Bon, DDS;  Location: [facility];  Service: Oral Surgery;  Laterality: N/A;    Home Medications:  Current Facility-Administered Medications  Medication Dose Route Frequency Provider Last Rate Last Admin  . ceFAZolin (ANCEF) IVPB 2g/100 mL premix  2 g Intravenous 30 min Pre-Op Cleon Gustin, MD      . lactated ringers infusion   Intravenous Continuous Denese Killings, MD 50 mL/hr at 11/30/20 0720 New Bag at 11/30/20 0720   Allergies:  Allergies   Allergen Reactions  . Bee Venom Anaphylaxis  . Benadryl [Diphenhydramine]     Makes him feel weird, like he is going to pass out  . Lorazepam Other (See Comments)    Makes him feel woozy.  Marland Kitchen Penicillins Other (See Comments)    CHILDHOOD ALLERGY  . Latex Rash    Family History  Adopted: Yes   Social History:  reports that he quit smoking about 18 years ago. His smoking use included cigarettes. He has a 54.00 pack-year smoking history. He has never used smokeless tobacco. He reports that he does not drink alcohol and does not use drugs.  Review of Systems  All other systems reviewed and are negative.   Physical Exam:  Vital signs in last 24 hours: Temp:  [98.6 F (37 C)] 98.6 F (37 C) (05/02 0709) Pulse Rate:  [55] 55 (05/02 0709) Resp:  [20] 20 (05/02 0709) BP: (120)/(68) 120/68 (05/02 0709) SpO2:  [100 %] 100 % (05/02 0709) Physical Exam Vitals reviewed.  Constitutional:      Appearance: Normal appearance.  HENT:     Head: Normocephalic and atraumatic.     Nose: Nose normal.     Mouth/Throat:     Mouth: Mucous membranes are moist.  Eyes:  Extraocular Movements: Extraocular movements intact.     Pupils: Pupils are equal, round, and reactive to light.  Cardiovascular:     Rate and Rhythm: Normal rate and regular rhythm.  Pulmonary:     Effort: Pulmonary effort is normal. No respiratory distress.  Abdominal:     General: Abdomen is flat. There is no distension.  Musculoskeletal:        General: No swelling. Normal range of motion.     Cervical back: Normal range of motion and neck supple.  Skin:    General: Skin is warm and dry.  Neurological:     General: No focal deficit present.     Mental Status: He is alert and oriented to person, place, and time.  Psychiatric:        Mood and Affect: Mood normal.        Behavior: Behavior normal.        Thought Content: Thought content normal.        Judgment: Judgment normal.     Laboratory Data:  Results for  orders placed or performed during the hospital encounter of 11/30/20 (from the past 24 hour(s))  Glucose, capillary     Status: Abnormal   Collection Time: 11/30/20  7:06 AM  Result Value Ref Range   Glucose-Capillary 101 (H) 70 - 99 mg/dL  I-STAT, chem 8     Status: Abnormal   Collection Time: 11/30/20  7:23 AM  Result Value Ref Range   Sodium 142 135 - 145 mmol/L   Potassium 4.3 3.5 - 5.1 mmol/L   Chloride 100 98 - 111 mmol/L   BUN 25 (H) 6 - 20 mg/dL   Creatinine, Ser 1.20 0.61 - 1.24 mg/dL   Glucose, Bld 117 (H) 70 - 99 mg/dL   Calcium, Ion 1.24 1.15 - 1.40 mmol/L   TCO2 31 22 - 32 mmol/L   Hemoglobin 13.6 13.0 - 17.0 g/dL   HCT 40.0 39.0 - 52.0 %   Recent Results (from the past 240 hour(s))  SARS CORONAVIRUS 2 (TAT 6-24 HRS) Nasopharyngeal Nasopharyngeal Swab     Status: None   Collection Time: 11/27/20  9:58 AM   Specimen: Nasopharyngeal Swab  Result Value Ref Range Status   SARS Coronavirus 2 NEGATIVE NEGATIVE Final    Comment: (NOTE) SARS-CoV-2 target nucleic acids are NOT DETECTED.  The SARS-CoV-2 RNA is generally detectable in upper and lower respiratory specimens during the acute phase of infection. Negative results do not preclude SARS-CoV-2 infection, do not rule out co-infections with other pathogens, and should not be used as the sole basis for treatment or other patient management decisions. Negative results must be combined with clinical observations, patient history, and epidemiological information. The expected result is Negative.  Fact Sheet for Patients: SugarRoll.be  Fact Sheet for Healthcare Providers: https://www.woods-mathews.com/  This test is not yet approved or cleared by the [place] FDA and  has been authorized for detection and/or diagnosis of SARS-CoV-2 by FDA under an Emergency Use Authorization (EUA). This EUA will remain  in effect (meaning this test can be used) for the duration of  the COVID-19 declaration under Se ction 564(b)(1) of the Act, 21 U.S.C. section 360bbb-3(b)(1), unless the authorization is terminated or revoked sooner.  Performed at [facility], [place] [address], [place], [place] [address]    Creatinine: Recent Labs    11/27/20 1008 11/30/20 0723  CREATININE 1.25* 1.20   Baseline Creatinine: 1.2  Impression/Assessment:  60yo with left renal calculus  Plan:  -We discussed the management of kidney stones. These options include observation, ureteroscopy, shockwave lithotripsy (ESWL) and percutaneous nephrolithotomy (PCNL). We discussed which options are relevant to the patient's stone(s). We discussed the natural history of kidney stones as well as the complications of untreated stones and the impact on quality of life without treatment as well as with each of the above listed treatments. We also discussed the efficacy of each treatment in its ability to clear the stone burden. With any of these management options I discussed the signs and symptoms of infection and the need for emergent treatment should these be experienced. For each option we discussed the ability of each procedure to clear the patient of their stone burden.   For observation I described the risks which include but are not limited to silent renal damage, life-threatening infection, need for emergent surgery, failure to pass stone and pain.   For ureteroscopy I described the risks which include bleeding, infection, damage to contiguous structures, positioning injury, ureteral stricture, ureteral avulsion, ureteral injury, need for prolonged ureteral stent, inability to perform ureteroscopy, need for an interval procedure, inability to clear stone burden, stent discomfort/pain, heart attack, stroke, pulmonary embolus and the inherent risks with general anesthesia.   For shockwave lithotripsy I described the risks which include arrhythmia, kidney contusion, kidney hemorrhage, need for  transfusion, pain, inability to adequately break up stone, inability to pass stone fragments, Steinstrasse, infection associated with obstructing stones, need for alternate surgical procedure, need for repeat shockwave lithotripsy, MI, CVA, PE and the inherent risks with anesthesia/conscious sedation.   For PCNL I described the risks including positioning injury, pneumothorax, hydrothorax, need for chest tube, inability to clear stone burden, renal laceration, arterial venous fistula or malformation, need for embolization of kidney, loss of kidney or renal function, need for repeat procedure, need for prolonged nephrostomy tube, ureteral avulsion, MI, CVA, PE and the inherent risks of general anesthesia.   - The patient would like to proceed with Left ureteroscopic stone extraction  Nicolette Bang 11/30/2020, 7:44 AM

## 2020-11-30 NOTE — Transfer of Care (Signed)
Immediate Anesthesia Transfer of Care Note  Patient: Charles Mathews  Procedure(s) Performed: CYSTOSCOPY WITH LEFT RETROGRADE PYELOGRAM, LEFT URETEROSCOPY AND LEFT STENT PLACEMENT (Left Ureter)  Patient Location: PACU  Anesthesia Type:General  Level of Consciousness: awake, alert , oriented and patient cooperative  Airway & Oxygen Therapy: Patient Spontanous Breathing and Patient connected to nasal cannula oxygen  Post-op Assessment: Report given to RN, Post -op Vital signs reviewed and stable and Patient moving all extremities  Post vital signs: Reviewed and stable  Last Vitals:  Vitals Value Taken Time  BP    Temp    Pulse 81 11/30/20 0912  Resp 21 11/30/20 0912  SpO2 87 % 11/30/20 0912  Vitals shown include unvalidated device data.  Last Pain:  Vitals:   11/30/20 0709  TempSrc: Oral  PainSc: 9          Complications: No complications documented.

## 2020-11-30 NOTE — Anesthesia Preprocedure Evaluation (Addendum)
Anesthesia Evaluation  Patient identified by MRN, date of birth, ID band Patient awake    Reviewed: Allergy & Precautions, NPO status , Patient's Chart, lab work & pertinent test results, reviewed documented beta blocker date and time   Airway Mallampati: III  TM Distance: >3 FB Neck ROM: Full    Dental  (+) Edentulous Upper, Edentulous Lower   Pulmonary shortness of breath, with exertion, lying and Long-Term Oxygen Therapy, asthma , COPD,  COPD inhaler and oxygen dependent, former smoker,    Pulmonary exam normal breath sounds clear to auscultation       Cardiovascular Exercise Tolerance: Poor hypertension, Pt. on medications and Pt. on home beta blockers + CAD  Normal cardiovascular exam Rhythm:Regular Rate:Normal     Neuro/Psych  Headaches, Seizures -, Well Controlled,  PSYCHIATRIC DISORDERS Anxiety Depression Bipolar Disorder    GI/Hepatic PUD, GERD  Medicated and Controlled,  Endo/Other  diabetes, Well Controlled, Type 2, Oral Hypoglycemic AgentsMorbid obesity  Renal/GU Renal InsufficiencyRenal disease     Musculoskeletal  (+) Arthritis ,   Abdominal   Peds  Hematology   Anesthesia Other Findings   Reproductive/Obstetrics                            Anesthesia Physical Anesthesia Plan  ASA: IV  Anesthesia Plan: General   Post-op Pain Management:    Induction: Intravenous  PONV Risk Score and Plan: Ondansetron and Metaclopromide  Airway Management Planned: Oral ETT  Additional Equipment:   Intra-op Plan:   Post-operative Plan:   Informed Consent: I have reviewed the patients History and Physical, chart, labs and discussed the procedure including the risks, benefits and alternatives for the proposed anesthesia with the patient or authorized representative who has indicated his/her understanding and acceptance.     Dental advisory given  Plan Discussed with: Surgeon and  CRNA  Anesthesia Plan Comments:        Anesthesia Quick Evaluation

## 2020-11-30 NOTE — Op Note (Signed)
.  Preoperative diagnosis: Left ureteral stone  Postoperative diagnosis: Same  Procedure: 1 cystoscopy 2. Left retrograde pyelography 3.  Intraoperative fluoroscopy, under one hour, with interpretation 4.  Left ureteroscopic stone manipulation with basket extraction 5.  Left 6 x 26 JJ stent placement  Attending: Rosie Fate  Anesthesia: General  Estimated blood loss: None  Drains: Left 6 x 26 JJ ureteral stent with tether  Specimens: stone for analysis  Antibiotics: ancef  Findings: left lower pole stone. No hydronephrosis. No masses/lesions in the bladder. Ureteral orifices in normal anatomic location.  Indications: Patient is a 60 year old male with a history of left renal stone.  After discussing treatment options, he decided proceed with left ureteroscopic stone manipulation.  Procedure in detail: The patient was brought to the operating room and a brief timeout was done to ensure correct patient, correct procedure, correct site.  General anesthesia was administered patient was placed in dorsal lithotomy position.  Her genitalia was then prepped and draped in usual sterile fashion.  A rigid 12 French cystoscope was passed in the urethra and the bladder.  Bladder was inspected free masses or lesions.  the ureteral orifices were in the normal orthotopic locations.  a 6 french ureteral catheter was then instilled into the left ureteral orifice.  a gentle retrograde was obtained and findings noted above.  we then placed a zip wire through the ureteral catheter and advanced up to the renal pelvis.  we then removed the cystoscope and cannulated the left ureteral orifice with a semirigid ureteroscope.  No stone was found in the ureter. Once we reached the UPJ a sensor wire was advanced in to the renal pelvis. We then removed the ureteroscope and advanced am 12/14 x 35cm access sheath up to the renal pelvis. We then used the flexible ureteroscope to perform nephroscopy. We encountered the  stone in the lower pole. The stone was soft and fragmented with basketing. The fragments were removed with an NGage basket.   once all stone fragments were removed we then removed the access sheath under direct vision and noted no injury to the ureter. We then placed a 6 x 26 double-j ureteral stent over the original zip wire.  We then removed the wire and good coil was noted in the the renal pelvis under fluoroscopy and the bladder under direct vision. the bladder was then drained and this concluded the procedure which was well tolerated by patient.  Complications: None  Condition: Stable, extubated, transferred to PACU  Plan: Patient is to be discharged home as to follow-up in one week. He is to remove his stent by pulling the tether in 72 hours

## 2020-11-30 NOTE — Anesthesia Procedure Notes (Signed)
Procedure Name: Intubation Date/Time: 11/30/2020 8:11 AM Performed by: Myna Bright, CRNA Pre-anesthesia Checklist: Patient identified, Emergency Drugs available, Suction available and Patient being monitored Patient Re-evaluated:Patient Re-evaluated prior to induction Oxygen Delivery Method: Circle system utilized Preoxygenation: Pre-oxygenation with 100% oxygen Induction Type: IV induction Ventilation: Mask ventilation with difficulty Laryngoscope Size: Mac and 4 Grade View: Grade I Tube type: Oral Tube size: 7.5 mm Number of attempts: 1 Airway Equipment and Method: Stylet Placement Confirmation: ETT inserted through vocal cords under direct vision,  positive ETCO2 and breath sounds checked- equal and bilateral Secured at: 22 cm Tube secured with: Tape Dental Injury: Teeth and Oropharynx as per pre-operative assessment

## 2020-12-01 ENCOUNTER — Encounter (HOSPITAL_COMMUNITY): Payer: Self-pay | Admitting: Urology

## 2020-12-04 LAB — CALCULI, WITH PHOTOGRAPH (CLINICAL LAB)
Calcium Oxalate Dihydrate: 30 %
Calcium Oxalate Monohydrate: 60 %
Hydroxyapatite: 10 %
Weight Calculi: 1 mg

## 2020-12-07 ENCOUNTER — Encounter: Payer: Self-pay | Admitting: Urology

## 2020-12-07 ENCOUNTER — Other Ambulatory Visit: Payer: Self-pay

## 2020-12-07 ENCOUNTER — Ambulatory Visit (INDEPENDENT_AMBULATORY_CARE_PROVIDER_SITE_OTHER): Payer: Medicare Other | Admitting: Urology

## 2020-12-07 VITALS — BP 156/71 | HR 80 | Temp 99.6°F | Ht 70.0 in | Wt 327.0 lb

## 2020-12-07 DIAGNOSIS — N200 Calculus of kidney: Secondary | ICD-10-CM | POA: Diagnosis not present

## 2020-12-07 LAB — URINALYSIS, ROUTINE W REFLEX MICROSCOPIC
Bilirubin, UA: NEGATIVE
Glucose, UA: NEGATIVE
Ketones, UA: NEGATIVE
Leukocytes,UA: NEGATIVE
Nitrite, UA: NEGATIVE
Protein,UA: NEGATIVE
Specific Gravity, UA: 1.015 (ref 1.005–1.030)
Urobilinogen, Ur: 0.2 mg/dL (ref 0.2–1.0)
pH, UA: 7.5 (ref 5.0–7.5)

## 2020-12-07 LAB — MICROSCOPIC EXAMINATION
Bacteria, UA: NONE SEEN
Epithelial Cells (non renal): NONE SEEN /hpf (ref 0–10)
Renal Epithel, UA: NONE SEEN /hpf
WBC, UA: NONE SEEN /hpf (ref 0–5)

## 2020-12-07 MED ORDER — PHENAZOPYRIDINE HCL 100 MG PO TABS: 100.0000 mg | ORAL_TABLET | Freq: Three times a day (TID) | ORAL | 0 refills | Status: DC | PRN

## 2020-12-07 NOTE — Patient Instructions (Signed)

## 2020-12-07 NOTE — Progress Notes (Signed)
12/07/2020 11:43 AM   Charles Mathews 31-May-1961 [id number]  Referring provider: Practice, [address] [place],  [place] [address]  nephrolithiasis  HPI: Charles Mathews is a 60yo here for followup for nephrolithiasis. He underwent Left ureteroscopic stone extraction 5/2 and removed his stent POD#3. Since stent removal he is having intermittent dysuria and mild left flank pain. UA today shows 3-10 RBCs   PMH: Past Medical History:  Diagnosis Date  . Anxiety   . Arthritis   . Asthma    Albuterol prn  . Bipolar disorder ([place])   . Chronic back pain   . COPD (chronic obstructive pulmonary disease) ([place])   . Coronary artery disease    11/01/12 he denies known CAD/MI/CHF history  . Diabetes mellitus    borderline  . Gastric ulcer   . GERD (gastroesophageal reflux disease)    takes Prilosec daily  . Head injury   . High cholesterol    takes zocor daily  . History of kidney stones   . Hypertension    takes metoprolol daily and cardura  . Insomnia   . Joint pain   . Lung mass   . Major depression    takes Zoloft and effexor daily  . Mentally challenged   . Migraine    last one - 10/15/20 takes Topamax daily  . Morbid obesity ([place])   . Neuropathy   . Pre-diabetes   . Seizures ([place])     10/27/20- last one age 99-14  . Shortness of breath    with exertion  . Urinary frequency   . Urinary urgency     Surgical History: Past Surgical History:  Procedure Laterality Date  . ARTERY BIOPSY N/A 10/28/2020   Procedure: BIOPSY TEMPORAL ARTERY;  Surgeon: Rosetta Posner, MD;  Location: [facility] OR;  Service: Vascular;  Laterality: N/A;  . CYSTOSCOPY WITH RETROGRADE PYELOGRAM, URETEROSCOPY AND STENT PLACEMENT Left 11/30/2020   Procedure: CYSTOSCOPY WITH LEFT RETROGRADE PYELOGRAM, LEFT URETEROSCOPY AND LEFT STENT PLACEMENT;  Surgeon: Cleon Gustin, MD;  Location: [facility];  Service: Urology;  Laterality: Left;  . LITHOTRIPSY  2009  . MULTIPLE EXTRACTIONS WITH ALVEOLOPLASTY N/A  12/17/2012   Procedure: MULTIPLE EXTRACTION WITH ALVEOLOPLASTY;  Surgeon: Gae Bon, DDS;  Location: [facility];  Service: Oral Surgery;  Laterality: N/A;    Home Medications:  Allergies as of 12/07/2020      Reactions   Bee Venom Anaphylaxis   Benadryl [diphenhydramine]    Makes him feel weird, like he is going to pass out   Lorazepam Other (See Comments)   Makes him feel woozy.   Penicillins Other (See Comments)   CHILDHOOD ALLERGY   Latex Rash      Medication List       Accurate as of Dec 07, 2020 11:43 AM. If you have any questions, ask your nurse or doctor.        Accu-Chek Guide test strip Generic drug: glucose blood USE ONCE DAILY TO TEST SUGAR   Accu-Chek Guide w/Device Kit USE ONCE DAILY TO TEST SUAGR   Accu-Chek Softclix Lancets lancets USE TO TEST ONCE DAILY   acetaminophen 650 MG CR tablet Commonly known as: TYLENOL Take 1,300 mg by mouth every 8 (eight) hours as needed for pain.   albuterol 108 (90 Base) MCG/ACT inhaler Commonly known as: VENTOLIN HFA Inhale 1 puff into the lungs every 6 (six) hours as needed for wheezing or shortness of breath.   aspirin EC 81 MG tablet  Take 81 mg by mouth daily.   benztropine 1 MG tablet Commonly known as: COGENTIN Take 1 mg by mouth daily.   Breztri Aerosphere 160-9-4.8 MCG/ACT Aero Generic drug: Budeson-Glycopyrrol-Formoterol Inhale 2 puffs into the lungs in the morning and at bedtime.   candesartan 8 MG tablet Commonly known as: ATACAND Take 8 mg by mouth daily.   cetirizine 10 MG tablet Commonly known as: ZYRTEC Take 10 mg by mouth daily.   doxazosin 2 MG tablet Commonly known as: CARDURA TAKE 1 TABLET BY MOUTH DAILY   Emgality 120 MG/ML Soaj Generic drug: Galcanezumab-gnlm Inject 120 mg into the skin every 28 (twenty-eight) days.   finasteride 5 MG tablet Commonly known as: PROSCAR Take 5 mg by mouth daily.   furosemide 40 MG tablet Commonly known as: LASIX TAKE 1 TABLET BY MOUTH DAILY    meclizine 12.5 MG tablet Commonly known as: ANTIVERT Take 12.5 mg by mouth 3 (three) times daily as needed for dizziness.   Melatonin 10 MG Tabs Take 20 mg by mouth at bedtime.   metFORMIN 500 MG tablet Commonly known as: GLUCOPHAGE Take 500 mg by mouth daily.   metoprolol tartrate 50 MG tablet Commonly known as: LOPRESSOR TAKE 1 TABLET BY MOUTH TWICE DAILY   multivitamin tablet Take 2 tablets by mouth daily. Gummies   omeprazole 20 MG capsule Commonly known as: PRILOSEC Take 20 mg by mouth daily.   ondansetron 4 MG disintegrating tablet Commonly known as: Zofran ODT Take 1 tablet (4 mg total) by mouth every 8 (eight) hours as needed for nausea or vomiting.   oxyCODONE-acetaminophen 5-325 MG tablet Commonly known as: Percocet Take 1 tablet by mouth every 4 (four) hours as needed.   paliperidone 3 MG 24 hr tablet Commonly known as: INVEGA Take 3 mg by mouth daily.   potassium chloride SA 20 MEQ tablet Commonly known as: KLOR-CON TAKE 1 TABLET BY MOUTH DAILY   sertraline 100 MG tablet Commonly known as: ZOLOFT Take 100 mg by mouth at bedtime.   simvastatin 40 MG tablet Commonly known as: ZOCOR TAKE 1 TABLET BY MOUTH DAILY   tamsulosin 0.4 MG Caps capsule Commonly known as: FLOMAX Take 0.4 mg by mouth daily.   topiramate 50 MG tablet Commonly known as: TOPAMAX Take 50 mg by mouth daily.   traZODone 100 MG tablet Commonly known as: DESYREL Take 100 mg by mouth at bedtime.       Allergies:  Allergies  Allergen Reactions  . Bee Venom Anaphylaxis  . Benadryl [Diphenhydramine]     Makes him feel weird, like he is going to pass out  . Lorazepam Other (See Comments)    Makes him feel woozy.  [place] Penicillins Other (See Comments)    CHILDHOOD ALLERGY  . Latex Rash    Family History: Family History  Adopted: Yes    Social History:  reports that he quit smoking about 18 years ago. His smoking use included cigarettes. He has a 54.00 pack-year smoking  history. He has never used smokeless tobacco. He reports that he does not drink alcohol and does not use drugs.  ROS: All other review of systems were reviewed and are negative except what is noted above in HPI  Physical Exam: BP (!) 156/71   Pulse 80   Temp 99.6 F (37.6 C)   Ht '5\' 10"'  (1.778 m)   Wt (!) 327 lb (148.3 kg)   BMI 46.92 kg/m   Constitutional:  Alert and oriented, No acute distress. HEENT: [place]  AT, moist mucus membranes.  Trachea midline, no masses. Cardiovascular: No clubbing, cyanosis, or edema. Respiratory: Normal respiratory effort, no increased work of breathing. GI: Abdomen is soft, nontender, nondistended, no abdominal masses GU: No CVA tenderness.  Lymph: No cervical or inguinal lymphadenopathy. Skin: No rashes, bruises or suspicious lesions. Neurologic: Grossly intact, no focal deficits, moving all 4 extremities. Psychiatric: Normal mood and affect.  Laboratory Data: Lab Results  Component Value Date   WBC 9.1 11/27/2020   HGB 13.6 11/30/2020   HCT 40.0 11/30/2020   MCV 99.1 11/27/2020   PLT 130 (L) 11/27/2020    Lab Results  Component Value Date   CREATININE 1.20 11/30/2020    Lab Results  Component Value Date   PSA 0.15 04/02/2011    No results found for: TESTOSTERONE  Lab Results  Component Value Date   HGBA1C 6.4 (H) 11/27/2020    Urinalysis    Component Value Date/Time   COLORURINE YELLOW 04/01/2011 1533   APPEARANCEUR Clear 10/16/2020 0946   LABSPEC 1.015 04/01/2011 1533   PHURINE 8.0 04/01/2011 1533   GLUCOSEU Negative 10/16/2020 0946   HGBUR NEGATIVE 04/01/2011 1533   BILIRUBINUR Negative 10/16/2020 0946   KETONESUR NEGATIVE 04/01/2011 1533   PROTEINUR Negative 10/16/2020 0946   PROTEINUR NEGATIVE 04/01/2011 1533   UROBILINOGEN 0.2 02/12/2020 1144   UROBILINOGEN 0.2 04/01/2011 1533   NITRITE Negative 10/16/2020 0946   NITRITE NEGATIVE 04/01/2011 1533   LEUKOCYTESUR Negative 10/16/2020 0946    Lab Results  Component  Value Date   LABMICR Comment 10/16/2020    Pertinent Imaging:  Results for orders placed during the hospital encounter of 10/11/18  DG Abd 1 View  Narrative CLINICAL DATA:  Right flank soreness.  EXAM: ABDOMEN - 1 VIEW  COMPARISON:  Abdominal radiographs, 06/27/2018.  CT, 03/03/2018.  FINDINGS: No evidence of renal or ureteral stones. Normal bowel gas pattern. Soft tissues are unremarkable. Skeletal structures are within normal limits.  IMPRESSION: Negative exam. No evidence of renal or ureteral stones. Intrarenal stones noted on the prior CT are either no longer present or, more likely, not visualized due to overlying bowel gas and stool.   Electronically Signed By: Lajean Manes M.D. On: 10/11/2018 20:56  No results found for this or any previous visit.  No results found for this or any previous visit.  No results found for this or any previous visit.  Results for orders placed during the hospital encounter of 10/05/20  Ultrasound renal complete  Narrative CLINICAL DATA:  History nephrolithiasis now with right lower quadrant discomfort. Lithotripsy in 2009.  EXAM: RENAL / URINARY TRACT ULTRASOUND COMPLETE  COMPARISON:  Renal ultrasound February 18, 2020  FINDINGS: Right Kidney:  Renal measurements: 12.7 x 6.2 x 6.1 cm = volume: 254 mL. Echogenicity within normal limits. No mass or hydronephrosis visualized.  Left Kidney:  Renal measurements: 13.2 x 6.2 x 5.0 cm = volume: 210 mL. Echogenicity within normal limits. 7 mm renal calculus. No mass or hydronephrosis visualized.  Bladder:  Appears normal for degree of bladder distention.  Other:  None.  IMPRESSION: 1. 7 mm left renal calculus. No hydronephrosis.   Electronically Signed By: Dahlia Bailiff MD On: 10/05/2020 15:56  No results found for this or any previous visit.  No results found for this or any previous visit.  No results found for this or any previous visit.   Assessment &  Plan:    1. Nephrolithiasis RTC 1 month with renal US - Urinalysis, Routine w reflex microscopic  No follow-ups on file.  Nicolette Bang, MD  [facility] [place]

## 2020-12-07 NOTE — Progress Notes (Signed)
Urological Symptom Review  Patient is experiencing the following symptoms: Frequent urination Burning/pain with urination Get up at night to urinate   Review of Systems  Gastrointestinal (upper)  : Negative for upper GI symptoms  Gastrointestinal (lower) : Negative for lower GI symptoms  Constitutional : Negative for symptoms  Skin: Negative for skin symptoms  Eyes: Negative for eye symptoms  Ear/Nose/Throat : Negative for Ear/Nose/Throat symptoms  Hematologic/Lymphatic: Negative for Hematologic/Lymphatic symptoms  Cardiovascular : Negative for cardiovascular symptoms  Respiratory : Negative for respiratory symptoms  Endocrine: Negative for endocrine symptoms  Musculoskeletal: Negative for musculoskeletal symptoms  Neurological: Negative for neurological symptoms  Psychologic: Depression Anxiety

## 2020-12-10 DIAGNOSIS — E1165 Type 2 diabetes mellitus with hyperglycemia: Secondary | ICD-10-CM | POA: Diagnosis not present

## 2020-12-10 DIAGNOSIS — N200 Calculus of kidney: Secondary | ICD-10-CM | POA: Diagnosis not present

## 2020-12-10 DIAGNOSIS — R519 Headache, unspecified: Secondary | ICD-10-CM | POA: Diagnosis not present

## 2020-12-10 DIAGNOSIS — I10 Essential (primary) hypertension: Secondary | ICD-10-CM | POA: Diagnosis not present

## 2020-12-10 DIAGNOSIS — J449 Chronic obstructive pulmonary disease, unspecified: Secondary | ICD-10-CM | POA: Diagnosis not present

## 2020-12-10 DIAGNOSIS — G4733 Obstructive sleep apnea (adult) (pediatric): Secondary | ICD-10-CM | POA: Diagnosis not present

## 2020-12-20 DIAGNOSIS — J411 Mucopurulent chronic bronchitis: Secondary | ICD-10-CM | POA: Diagnosis not present

## 2020-12-20 DIAGNOSIS — J449 Chronic obstructive pulmonary disease, unspecified: Secondary | ICD-10-CM | POA: Diagnosis not present

## 2020-12-28 DIAGNOSIS — I10 Essential (primary) hypertension: Secondary | ICD-10-CM | POA: Diagnosis not present

## 2020-12-28 DIAGNOSIS — J441 Chronic obstructive pulmonary disease with (acute) exacerbation: Secondary | ICD-10-CM | POA: Diagnosis not present

## 2020-12-28 DIAGNOSIS — I2510 Atherosclerotic heart disease of native coronary artery without angina pectoris: Secondary | ICD-10-CM | POA: Diagnosis not present

## 2020-12-28 DIAGNOSIS — Z87891 Personal history of nicotine dependence: Secondary | ICD-10-CM | POA: Diagnosis not present

## 2021-01-01 DIAGNOSIS — R519 Headache, unspecified: Secondary | ICD-10-CM | POA: Diagnosis not present

## 2021-01-01 DIAGNOSIS — Z9981 Dependence on supplemental oxygen: Secondary | ICD-10-CM | POA: Diagnosis not present

## 2021-01-01 DIAGNOSIS — J449 Chronic obstructive pulmonary disease, unspecified: Secondary | ICD-10-CM | POA: Diagnosis not present

## 2021-01-01 DIAGNOSIS — G4762 Sleep related leg cramps: Secondary | ICD-10-CM | POA: Diagnosis not present

## 2021-01-01 DIAGNOSIS — J9611 Chronic respiratory failure with hypoxia: Secondary | ICD-10-CM | POA: Diagnosis not present

## 2021-01-01 DIAGNOSIS — T887XXA Unspecified adverse effect of drug or medicament, initial encounter: Secondary | ICD-10-CM | POA: Diagnosis not present

## 2021-01-07 DIAGNOSIS — L723 Sebaceous cyst: Secondary | ICD-10-CM | POA: Diagnosis not present

## 2021-01-07 DIAGNOSIS — T148XXA Other injury of unspecified body region, initial encounter: Secondary | ICD-10-CM | POA: Diagnosis not present

## 2021-01-20 DIAGNOSIS — J449 Chronic obstructive pulmonary disease, unspecified: Secondary | ICD-10-CM | POA: Diagnosis not present

## 2021-01-20 DIAGNOSIS — J411 Mucopurulent chronic bronchitis: Secondary | ICD-10-CM | POA: Diagnosis not present

## 2021-01-22 ENCOUNTER — Ambulatory Visit (HOSPITAL_COMMUNITY): Payer: Medicare Other

## 2021-01-25 ENCOUNTER — Other Ambulatory Visit: Payer: Self-pay

## 2021-01-25 ENCOUNTER — Ambulatory Visit (HOSPITAL_COMMUNITY)
Admission: RE | Admit: 2021-01-25 | Discharge: 2021-01-25 | Disposition: A | Discharge status: Home / Self Care | Payer: Medicare Other | Source: Ambulatory Visit | Attending: Urology | Admitting: Urology

## 2021-01-25 DIAGNOSIS — N281 Cyst of kidney, acquired: Secondary | ICD-10-CM | POA: Diagnosis not present

## 2021-01-25 DIAGNOSIS — N200 Calculus of kidney: Secondary | ICD-10-CM | POA: Diagnosis not present

## 2021-01-28 DIAGNOSIS — J441 Chronic obstructive pulmonary disease with (acute) exacerbation: Secondary | ICD-10-CM | POA: Diagnosis not present

## 2021-01-28 DIAGNOSIS — Z87891 Personal history of nicotine dependence: Secondary | ICD-10-CM | POA: Diagnosis not present

## 2021-01-28 DIAGNOSIS — I10 Essential (primary) hypertension: Secondary | ICD-10-CM | POA: Diagnosis not present

## 2021-01-28 DIAGNOSIS — I2510 Atherosclerotic heart disease of native coronary artery without angina pectoris: Secondary | ICD-10-CM | POA: Diagnosis not present

## 2021-01-29 ENCOUNTER — Other Ambulatory Visit: Payer: Self-pay

## 2021-01-29 ENCOUNTER — Ambulatory Visit (INDEPENDENT_AMBULATORY_CARE_PROVIDER_SITE_OTHER): Payer: Medicare Other | Admitting: Urology

## 2021-01-29 ENCOUNTER — Encounter: Payer: Self-pay | Admitting: Urology

## 2021-01-29 VITALS — BP 99/63 | HR 65

## 2021-01-29 DIAGNOSIS — N200 Calculus of kidney: Secondary | ICD-10-CM

## 2021-01-29 LAB — URINALYSIS, ROUTINE W REFLEX MICROSCOPIC
Bilirubin, UA: NEGATIVE
Glucose, UA: NEGATIVE
Ketones, UA: NEGATIVE
Leukocytes,UA: NEGATIVE
Nitrite, UA: NEGATIVE
Protein,UA: NEGATIVE
RBC, UA: NEGATIVE
Specific Gravity, UA: 1.02 (ref 1.005–1.030)
Urobilinogen, Ur: 0.2 mg/dL (ref 0.2–1.0)
pH, UA: 5.5 (ref 5.0–7.5)

## 2021-01-29 NOTE — Progress Notes (Signed)
Urological Symptom Review  Patient is experiencing the following symptoms: none   Review of Systems  Gastrointestinal (upper)  : Negative for upper GI symptoms  Gastrointestinal (lower) : Negative for lower GI symptoms  Constitutional : Weight loss  Skin: Negative for skin symptoms  Eyes: Negative for eye symptoms  Ear/Nose/Throat : Sinus problems  Hematologic/Lymphatic: Negative for Hematologic/Lymphatic symptoms  Cardiovascular : Negative for cardiovascular symptoms  Respiratory : Cough Shortness of breath  Endocrine: Negative for endocrine symptoms  Musculoskeletal: Joint pain  Neurological: Headaches  Psychologic: Depression Anxiety

## 2021-01-29 NOTE — Patient Instructions (Signed)
Textbook of Natural Medicine (5th ed., pp. 1518-1527.e3). [place], [place]: Elsevier.">  Dietary Guidelines to Help Prevent Kidney Stones Kidney stones are deposits of minerals and salts that form inside your kidneys. Your risk of developing kidney stones may be greater depending on your diet, your lifestyle, the medicines you take, and whether you have certain medical conditions. Most people can lower their chances of developing kidney stones by following the instructions below. Your dietitian may give you more specific instructions depending on your overall health and the type of kidney stones youtend to develop. What are tips for following this plan? Reading food labels  Choose foods with "no salt added" or "low-salt" labels. Limit your salt (sodium) intake to less than 1,500 mg a day. Choose foods with calcium for each meal and snack. Try to eat about 300 mg of calcium at each meal. Foods that contain 200-500 mg of calcium a serving include: 8 oz (237 mL) of milk, calcium-fortifiednon-dairy milk, and calcium-fortifiedfruit juice. Calcium-fortified means that calcium has been added to these drinks. 8 oz (237 mL) of kefir, yogurt, and soy yogurt. 4 oz (114 g) of tofu. 1 oz (28 g) of cheese. 1 cup (150 g) of dried figs. 1 cup (91 g) of cooked broccoli. One 3 oz (85 g) can of sardines or mackerel. Most people need 1,000-1,500 mg of calcium a day. Talk to your dietitian abouthow much calcium is recommended for you. Shopping Buy plenty of fresh fruits and vegetables. Most people do not need to avoid fruits and vegetables, even if these foods contain nutrients that may contribute to kidney stones. When shopping for convenience foods, choose: Whole pieces of fruit. Pre-made salads with dressing on the side. Low-fat fruit and yogurt smoothies. Avoid buying frozen meals or prepared deli foods. These can be high in sodium. Look for foods with live cultures, such as yogurt and kefir. Choose high-fiber  grains, such as whole-wheat breads, oat bran, and wheat cereals. Cooking Do not add salt to food when cooking. Place a salt shaker on the table and allow each person to add his or her own salt to taste. Use vegetable protein, such as beans, textured vegetable protein (TVP), or tofu, instead of meat in pasta, casseroles, and soups. Meal planning Eat less salt, if told by your dietitian. To do this: Avoid eating processed or pre-made food. Avoid eating fast food. Eat less animal protein, including cheese, meat, poultry, or fish, if told by your dietitian. To do this: Limit the number of times you have meat, poultry, fish, or cheese each week. Eat a diet free of meat at least 2 days a week. Eat only one serving each day of meat, poultry, fish, or seafood. When you prepare animal protein, cut pieces into small portion sizes. For most meat and fish, one serving is about the size of the palm of your hand. Eat at least five servings of fresh fruits and vegetables each day. To do this: Keep fruits and vegetables on hand for snacks. Eat one piece of fruit or a handful of berries with breakfast. Have a salad and fruit at lunch. Have two kinds of vegetables at dinner. Limit foods that are high in a substance called oxalate. These include: Spinach (cooked), rhubarb, beets, sweet potatoes, and Swiss chard. Peanuts. Potato chips, french fries, and baked potatoes with skin on. Nuts and nut products. Chocolate. If you regularly take a diuretic medicine, make sure to eat at least 1 or 2 servings of fruits or vegetables that are   high in potassium each day. These include: Avocado. Banana. Orange, prune, carrot, or tomato juice. Baked potato. Cabbage. Beans and split peas. Lifestyle  Drink enough fluid to keep your urine pale yellow. This is the most important thing you can do. Spread your fluid intake throughout the day. If you drink alcohol: Limit how much you use to: 0-1 drink a day for women who  are not pregnant. 0-2 drinks a day for men. Be aware of how much alcohol is in your drink. In the [place] one drink equals one 12 oz bottle of beer (355 mL), one 5 oz glass of wine (148 mL), or one 1 oz glass of hard liquor (44 mL). Lose weight if told by your health care provider. Work with your dietitian to find an eating plan and weight loss strategies that work best for you.  General information Talk to your health care provider and dietitian about taking daily supplements. You may be told the following depending on your health and the cause of your kidney stones: Not to take supplements with vitamin C. To take a calcium supplement. To take a daily probiotic supplement. To take other supplements such as magnesium, fish oil, or vitamin B6. Take over-the-counter and prescription medicines only as told by your health care provider. These include supplements. What foods should I limit? Limit your intake of the following foods, or eat them as told by your dietitian. Vegetables Spinach. Rhubarb. Beets. Canned vegetables. Pickles. Olives. Baked potatoeswith skin. Grains Wheat bran. Baked goods. Salted crackers. Cereals high in sugar. Meats and other proteins Nuts. Nut butters. Large portions of meat, poultry, or fish. Salted, precooked,or cured meats, such as sausages, meat loaves, and hot dogs. Dairy Cheese. Beverages Regular soft drinks. Regular vegetable juice. Seasonings and condiments Seasoning blends with salt. Salad dressings. Soy sauce. Ketchup. Barbecue sauce. Other foods Canned soups. Canned pasta sauce. Casseroles. Pizza. Lasagna. Frozen meals.Potato chips. French fries. The items listed above may not be a complete list of foods and beverages you should limit. Contact a dietitian for more information. What foods should I avoid? Talk to your dietitian about specific foods you should avoid based on the typeof kidney stones you have and your overall health. Fruits Grapefruit. The  item listed above may not be a complete list of foods and beverages you should avoid. Contact a dietitian for more information. Summary Kidney stones are deposits of minerals and salts that form inside your kidneys. You can lower your risk of kidney stones by making changes to your diet. The most important thing you can do is drink enough fluid. Drink enough fluid to keep your urine pale yellow. Talk to your dietitian about how much calcium you should have each day, and eat less salt and animal protein as told by your dietitian. This information is not intended to replace advice given to you by your health care provider. Make sure you discuss any questions you have with your healthcare provider. Document Revised: 07/11/2019 Document Reviewed: 07/11/2019 Elsevier Patient Education  2022 [employer]  

## 2021-01-29 NOTE — Progress Notes (Signed)
01/29/2021 10:17 AM   Charles Mathews 1960-11-24 [id number]  Referring provider: Bonnita Hollow, MD [place],  [place] [address]  Followup nephrolithiasis   HPI: Charles Mathews is a 60yo here for followup for nephrolithiasis. He has intermittent right flank pain. Renal US 6/27 shows left lower pole calcification and no right renal calculi. No LUTS. No other complaints today   PMH: Past Medical History:  Diagnosis Date   Anxiety    Arthritis    Asthma    Albuterol prn   Bipolar disorder (HCC)    Chronic back pain    COPD (chronic obstructive pulmonary disease) ([place])    Coronary artery disease    11/01/12 he denies known CAD/MI/CHF history   Diabetes mellitus    borderline   Gastric ulcer    GERD (gastroesophageal reflux disease)    takes Prilosec daily   Head injury    High cholesterol    takes zocor daily   History of kidney stones    Hypertension    takes metoprolol daily and cardura   Insomnia    Joint pain    Lung mass    Major depression    takes Zoloft and effexor daily   Mentally challenged    Migraine    last one - 10/15/20 takes Topamax daily   Morbid obesity ([place])    Neuropathy    Pre-diabetes    Seizures ([place])     10/27/20- last one age 28-14   Shortness of breath    with exertion   Urinary frequency    Urinary urgency     Surgical History: Past Surgical History:  Procedure Laterality Date   ARTERY BIOPSY N/A 10/28/2020   Procedure: BIOPSY TEMPORAL ARTERY;  Surgeon: Rosetta Posner, MD;  Location: [facility];  Service: Vascular;  Laterality: N/A;   CYSTOSCOPY WITH RETROGRADE PYELOGRAM, URETEROSCOPY AND STENT PLACEMENT Left 11/30/2020   Procedure: CYSTOSCOPY WITH LEFT RETROGRADE PYELOGRAM, LEFT URETEROSCOPY AND LEFT STENT PLACEMENT;  Surgeon: Cleon Gustin, MD;  Location: [facility];  Service: Urology;  Laterality: Left;   LITHOTRIPSY  2009   MULTIPLE EXTRACTIONS WITH ALVEOLOPLASTY N/A 12/17/2012   Procedure: MULTIPLE EXTRACTION WITH ALVEOLOPLASTY;   Surgeon: Gae Bon, DDS;  Location: [facility];  Service: Oral Surgery;  Laterality: N/A;    Home Medications:  Allergies as of 01/29/2021       Reactions   Bee Venom Anaphylaxis   Benadryl [diphenhydramine]    Makes him feel weird, like he is going to pass out   Lorazepam Other (See Comments)   Makes him feel woozy.   Penicillins Other (See Comments)   CHILDHOOD ALLERGY   Latex Rash        Medication List        Accurate as of January 29, 2021 10:17 AM. If you have any questions, ask your nurse or doctor.          Accu-Chek Guide test strip Generic drug: glucose blood USE ONCE DAILY TO TEST SUGAR   Accu-Chek Guide w/Device Kit USE ONCE DAILY TO TEST SUAGR   Accu-Chek Softclix Lancets lancets USE TO TEST ONCE DAILY   acetaminophen 650 MG CR tablet Commonly known as: TYLENOL Take 1,300 mg by mouth every 8 (eight) hours as needed for pain.   albuterol 108 (90 Base) MCG/ACT inhaler Commonly known as: VENTOLIN HFA Inhale 1 puff into the lungs every 6 (six) hours as needed for wheezing or shortness of breath.   aspirin  EC 81 MG tablet Take 81 mg by mouth daily.   benztropine 1 MG tablet Commonly known as: COGENTIN Take 1 mg by mouth daily.   Breztri Aerosphere 160-9-4.8 MCG/ACT Aero Generic drug: Budeson-Glycopyrrol-Formoterol Inhale 2 puffs into the lungs in the morning and at bedtime.   candesartan 8 MG tablet Commonly known as: ATACAND Take 8 mg by mouth daily.   cetirizine 10 MG tablet Commonly known as: ZYRTEC Take 10 mg by mouth daily.   doxazosin 2 MG tablet Commonly known as: CARDURA TAKE 1 TABLET BY MOUTH DAILY   Emgality 120 MG/ML Soaj Generic drug: Galcanezumab-gnlm Inject 120 mg into the skin every 28 (twenty-eight) days.   finasteride 5 MG tablet Commonly known as: PROSCAR Take 5 mg by mouth daily.   furosemide 40 MG tablet Commonly known as: LASIX TAKE 1 TABLET BY MOUTH DAILY   gabapentin 100 MG capsule Commonly known as:  NEURONTIN Take by mouth.   meclizine 12.5 MG tablet Commonly known as: ANTIVERT Take 12.5 mg by mouth 3 (three) times daily as needed for dizziness.   Melatonin 10 MG Tabs Take 20 mg by mouth at bedtime.   metFORMIN 500 MG tablet Commonly known as: GLUCOPHAGE Take 500 mg by mouth daily.   metoprolol tartrate 50 MG tablet Commonly known as: LOPRESSOR TAKE 1 TABLET BY MOUTH TWICE DAILY   multivitamin tablet Take 2 tablets by mouth daily. Gummies   omeprazole 20 MG capsule Commonly known as: PRILOSEC Take 20 mg by mouth daily.   ondansetron 4 MG disintegrating tablet Commonly known as: Zofran ODT Take 1 tablet (4 mg total) by mouth every 8 (eight) hours as needed for nausea or vomiting.   oxyCODONE-acetaminophen 5-325 MG tablet Commonly known as: Percocet Take 1 tablet by mouth every 4 (four) hours as needed.   paliperidone 3 MG 24 hr tablet Commonly known as: INVEGA Take 3 mg by mouth daily.   phenazopyridine 100 MG tablet Commonly known as: Pyridium Take 1 tablet (100 mg total) by mouth 3 (three) times daily as needed for pain.   potassium chloride SA 20 MEQ tablet Commonly known as: KLOR-CON TAKE 1 TABLET BY MOUTH DAILY   sertraline 100 MG tablet Commonly known as: ZOLOFT Take 100 mg by mouth at bedtime.   simvastatin 40 MG tablet Commonly known as: ZOCOR TAKE 1 TABLET BY MOUTH DAILY   tamsulosin 0.4 MG Caps capsule Commonly known as: FLOMAX Take 0.4 mg by mouth daily.   topiramate 50 MG tablet Commonly known as: TOPAMAX Take 50 mg by mouth daily.   traZODone 100 MG tablet Commonly known as: DESYREL Take 100 mg by mouth at bedtime.        Allergies:  Allergies  Allergen Reactions   Bee Venom Anaphylaxis   Benadryl [Diphenhydramine]     Makes him feel weird, like he is going to pass out   Lorazepam Other (See Comments)    Makes him feel woozy.   Penicillins Other (See Comments)    CHILDHOOD ALLERGY   Latex Rash    Family  History: Family History  Adopted: Yes    Social History:  reports that he quit smoking about 18 years ago. His smoking use included cigarettes. He has a 54.00 pack-year smoking history. He has never used smokeless tobacco. He reports that he does not drink alcohol and does not use drugs.  ROS: All other review of systems were reviewed and are negative except what is noted above in HPI  Physical Exam: BP 99/63  Pulse 65   Constitutional:  Alert and oriented, No acute distress. HEENT: [place] AT, moist mucus membranes.  Trachea midline, no masses. Cardiovascular: No clubbing, cyanosis, or edema. Respiratory: Normal respiratory effort, no increased work of breathing. GI: Abdomen is soft, nontender, nondistended, no abdominal masses GU: No CVA tenderness.  Lymph: No cervical or inguinal lymphadenopathy. Skin: No rashes, bruises or suspicious lesions. Neurologic: Grossly intact, no focal deficits, moving all 4 extremities. Psychiatric: Normal mood and affect.  Laboratory Data: Lab Results  Component Value Date   WBC 9.1 11/27/2020   HGB 13.6 11/30/2020   HCT 40.0 11/30/2020   MCV 99.1 11/27/2020   PLT 130 (L) 11/27/2020    Lab Results  Component Value Date   CREATININE 1.20 11/30/2020    Lab Results  Component Value Date   PSA 0.15 04/02/2011    No results found for: TESTOSTERONE  Lab Results  Component Value Date   HGBA1C 6.4 (H) 11/27/2020    Urinalysis    Component Value Date/Time   COLORURINE YELLOW 04/01/2011 1533   APPEARANCEUR Clear 12/07/2020 1154   LABSPEC 1.015 04/01/2011 1533   PHURINE 8.0 04/01/2011 1533   GLUCOSEU Negative 12/07/2020 1154   HGBUR NEGATIVE 04/01/2011 1533   BILIRUBINUR Negative 12/07/2020 [place] 04/01/2011 1533   PROTEINUR Negative 12/07/2020 1154   PROTEINUR NEGATIVE 04/01/2011 1533   UROBILINOGEN 0.2 02/12/2020 1144   UROBILINOGEN 0.2 04/01/2011 1533   NITRITE Negative 12/07/2020 1154   NITRITE NEGATIVE  04/01/2011 1533   LEUKOCYTESUR Negative 12/07/2020 1154    Lab Results  Component Value Date   LABMICR See below: 12/07/2020   WBCUA None seen 12/07/2020   LABEPIT None seen 12/07/2020   MUCUS Present 12/07/2020   BACTERIA None seen 12/07/2020    Pertinent Imaging: Renal US 6/27: Images reviewed and discussed with the patient Results for orders placed during the hospital encounter of 10/11/18  DG Abd 1 View  Narrative CLINICAL DATA:  Right flank soreness.  EXAM: ABDOMEN - 1 VIEW  COMPARISON:  Abdominal radiographs, 06/27/2018.  CT, 03/03/2018.  FINDINGS: No evidence of renal or ureteral stones. Normal bowel gas pattern. Soft tissues are unremarkable. Skeletal structures are within normal limits.  IMPRESSION: Negative exam. No evidence of renal or ureteral stones. Intrarenal stones noted on the prior CT are either no longer present or, more likely, not visualized due to overlying bowel gas and stool.   Electronically Signed By: Lajean Manes M.D. On: 10/11/2018 20:56  No results found for this or any previous visit.  No results found for this or any previous visit.  No results found for this or any previous visit.  Results for orders placed during the hospital encounter of 01/25/21  Ultrasound renal complete  Narrative CLINICAL DATA:  Nephrolithiasis  EXAM: RENAL / URINARY TRACT ULTRASOUND COMPLETE  COMPARISON:  Ultrasound 02/10/2020, CT 07/12/2019  FINDINGS: Right Kidney:  Renal measurements: 13.1 x 6.8 x 6.6 cm = volume: 304.8 mL. Echogenicity within normal limits. No mass or hydronephrosis visualized.  Left Kidney:  Renal measurements: 13.2 x 6.9 x 5.1 cm = volume: 243.2 mL. Cortical echogenicity normal. No hydronephrosis. 9 mm stone in the lower pole. Cyst within the mid to upper pole measuring 26 mm.  Bladder:  Appears normal for degree of bladder distention.  Other:  None.  IMPRESSION: 1. Negative for hydronephrosis. 2. 9 mm  stone lower pole left kidney 3. Small cyst within the left kidney   Electronically Signed By: Donavan Foil  M.D. On: 01/25/2021 20:13  No results found for this or any previous visit.  No results found for this or any previous visit.  No results found for this or any previous visit.   Assessment & Plan:    1. Nephrolithiasis -dietary handout  -RCT 6 months with a renal US - Urinalysis, Routine w reflex microscopic   No follow-ups on file.  Nicolette Bang, MD  [facility] [place]

## 2021-02-04 ENCOUNTER — Ambulatory Visit (HOSPITAL_COMMUNITY)
Admission: RE | Admit: 2021-02-04 | Discharge: 2021-02-04 | Disposition: A | Discharge status: Home / Self Care | Payer: Medicare Other | Source: Ambulatory Visit | Attending: Pulmonary Disease | Admitting: Pulmonary Disease

## 2021-02-04 ENCOUNTER — Other Ambulatory Visit: Payer: Self-pay

## 2021-02-04 DIAGNOSIS — R918 Other nonspecific abnormal finding of lung field: Secondary | ICD-10-CM | POA: Insufficient documentation

## 2021-02-04 DIAGNOSIS — J8410 Pulmonary fibrosis, unspecified: Secondary | ICD-10-CM | POA: Diagnosis not present

## 2021-02-04 DIAGNOSIS — I517 Cardiomegaly: Secondary | ICD-10-CM | POA: Diagnosis not present

## 2021-02-04 DIAGNOSIS — Q859 Phakomatosis, unspecified: Secondary | ICD-10-CM | POA: Diagnosis not present

## 2021-02-10 ENCOUNTER — Telehealth: Payer: Self-pay | Admitting: Pulmonary Disease

## 2021-02-10 NOTE — Telephone Encounter (Signed)
Pt states that he is returning a phone call in which he believes is in regards to his CT results the CT was done on (02/04/2021).  Pls regard; [phone]

## 2021-02-10 NOTE — Telephone Encounter (Signed)
Tried calling the pt and there was no answer- LMTCB   Chesley Mires, MD  02/09/2021  3:16 PM EDT      Please let him know that the CT chest looks the same compared to January 2022, and finding in right lower lung is consistent with a hamartoma.  Nothing neededat this time.  Will discuss in more detail at his visit in August.

## 2021-02-11 NOTE — Telephone Encounter (Signed)
Lmtcb for pt.  

## 2021-02-11 NOTE — Telephone Encounter (Signed)
Called and spoke with patient to go over CT results. Patient expressed understanding and we confirmed appt in August. Nothing further needed at this time.

## 2021-02-13 ENCOUNTER — Other Ambulatory Visit: Payer: Self-pay | Admitting: Cardiology

## 2021-02-19 ENCOUNTER — Other Ambulatory Visit: Payer: Self-pay | Admitting: Physician Assistant

## 2021-02-19 ENCOUNTER — Other Ambulatory Visit: Payer: Self-pay | Admitting: Cardiology

## 2021-02-19 DIAGNOSIS — J449 Chronic obstructive pulmonary disease, unspecified: Secondary | ICD-10-CM | POA: Diagnosis not present

## 2021-02-19 DIAGNOSIS — J411 Mucopurulent chronic bronchitis: Secondary | ICD-10-CM | POA: Diagnosis not present

## 2021-02-28 DIAGNOSIS — I2510 Atherosclerotic heart disease of native coronary artery without angina pectoris: Secondary | ICD-10-CM | POA: Diagnosis not present

## 2021-02-28 DIAGNOSIS — I10 Essential (primary) hypertension: Secondary | ICD-10-CM | POA: Diagnosis not present

## 2021-02-28 DIAGNOSIS — Z87891 Personal history of nicotine dependence: Secondary | ICD-10-CM | POA: Diagnosis not present

## 2021-02-28 DIAGNOSIS — J441 Chronic obstructive pulmonary disease with (acute) exacerbation: Secondary | ICD-10-CM | POA: Diagnosis not present

## 2021-03-02 ENCOUNTER — Ambulatory Visit (INDEPENDENT_AMBULATORY_CARE_PROVIDER_SITE_OTHER): Payer: Medicare Other | Admitting: Pulmonary Disease

## 2021-03-02 ENCOUNTER — Other Ambulatory Visit: Payer: Self-pay

## 2021-03-02 ENCOUNTER — Encounter: Payer: Self-pay | Admitting: Pulmonary Disease

## 2021-03-02 VITALS — BP 124/80 | HR 61 | Temp 98.1°F | Ht 69.0 in | Wt 333.0 lb

## 2021-03-02 DIAGNOSIS — J411 Mucopurulent chronic bronchitis: Secondary | ICD-10-CM | POA: Diagnosis not present

## 2021-03-02 DIAGNOSIS — E662 Morbid (severe) obesity with alveolar hypoventilation: Secondary | ICD-10-CM | POA: Diagnosis not present

## 2021-03-02 DIAGNOSIS — J9611 Chronic respiratory failure with hypoxia: Secondary | ICD-10-CM

## 2021-03-02 DIAGNOSIS — R918 Other nonspecific abnormal finding of lung field: Secondary | ICD-10-CM | POA: Diagnosis not present

## 2021-03-02 DIAGNOSIS — G4733 Obstructive sleep apnea (adult) (pediatric): Secondary | ICD-10-CM

## 2021-03-02 DIAGNOSIS — Z6841 Body Mass Index (BMI) 40.0 and over, adult: Secondary | ICD-10-CM

## 2021-03-02 MED ORDER — FLUTICASONE PROPIONATE 50 MCG/ACT NA SUSP: 1.0000 | Freq: Every day | NASAL | 2 refills | Status: DC

## 2021-03-02 NOTE — Progress Notes (Signed)
[facility]  Chief Complaint  Patient presents with   Follow-up    Patient had CT scan on 02/04/21. Patient feels like his breathing is worse since last visit. Wears 3 liters oxygen at night and as needed during the day.     Constitutional:  BP 124/80 (BP Location: Left Wrist, Patient Position: Sitting, Cuff Size: Large)   Pulse 61   Temp 98.1 F (36.7 C) (Oral)   Ht '5\' 9"'  (1.753 m)   Wt (!) 333 lb (151 kg)   SpO2 94%   BMI 49.18 kg/m   Past Medical History:  Anxiety, OA, Asthma, Back pain, Nephrolithiasis, CAD, DM, GERD, Gastric ulcer, HLD, HTN, Diastolic CHF, Depression, Neuropathy, Seizures  Past Surgical History:  He  has a past surgical history that includes Lithotripsy (2009); Multiple extractions with alveoloplasty (N/A, 12/17/2012); Artery Biopsy (N/A, 10/28/2020); and Cystoscopy with retrograde pyelogram, ureteroscopy and stent placement (Left, 11/30/2020).  Brief Summary:  Charles Mathews is a 60 y.o. male former smoker with lung mass.      Subjective:   He is accompanied by his CNA.   He had CT chest that showed stable hamartoma.  He hasn't done PFT yet.  Doesn't have CPAP.  Uses oxygen at night.  Had trouble with pressure running too low when he got CPAP initially.  Snores, and is restless at night.  Stops breathing at night and feels tired during the day.  Has more sinus congestion and post nasal drip.  Has cough with clear to green sputum.  He dropped to 87% on room air with walking in office today.  He was recovered on supplemental oxygen at 3 liters to above 92%.  Physical Exam:   Appearance - well kempt   ENMT - no sinus tenderness, no oral exudate, no LAN, Mallampati 4 airway, no stridor, edentulous, clear nasal drainage  Respiratory - equal breath sounds bilaterally, no wheezing or rales  CV - s1s2 regular rate and rhythm, no murmurs  Ext - no clubbing, no edema  Skin - no rashes  Psych - normal mood  and affect    Pulmonary testing:    Chest Imaging:  CT chest 04/01/11 >> 3.5 x 2.9 mass RLL PET scan 11/15/12 >> fat density and calcifications in Rt lower lung mass 2.9 cm CT chest 06/05/13 >> 3.5 x 3.1 cm RLL mass with fat and calcification CT chest 08/28/20 >> superior segment RLL hamartoma 4.2 x 4 cm  CT chest 02/05/21 >> no change in size of hamartoma  Sleep Tests:    Cardiac Tests:  Echo 08/28/20 >> EF 55 to 60%, mild LVH  Social History:  He  reports that he quit smoking about 18 years ago. His smoking use included cigarettes. He has a 54.00 pack-year smoking history. He has been exposed to tobacco smoke. He has never used smokeless tobacco. He reports that he does not drink alcohol and does not use drugs.  Family History:  His family history is not on file. He was adopted.     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 - continue breztri and prn albuterol - will make sure PFT gets scheduled at [facility]  Obesity hypoventilation syndrome. - I am concerned he still has significant sleep apnea - will arrange for home sleep study to further assess and then consider trial of CPAP again -  continue 3 liters oxygen at night and with exertion - will arrange for POC   Morbid obesity with BMI 49.18. - discussed importance of weight loss  Palpitations, Chronic diastolic CHF, Hypertension, Hyperlipidemia. - followed by Dr. Johnny Bridge with [facility]  Time Spent Involved in Patient Care on Day of Examination:  32 minutes  Follow up:   Patient Instructions  Will check on status of getting pulmonary function test scheduled at [facility]  Will arrange for home sleep study  Will arrange for a portable oxygen concentrator  Use saline nasal spray nightly  Use flonase one spray in each nostril nighlty  Follow up in 4 months  Medication  List:   Allergies as of 03/02/2021       Reactions   Bee Venom Anaphylaxis   Benadryl [diphenhydramine]    Makes him feel weird, like he is going to pass out   Lorazepam Other (See Comments)   Makes him feel woozy.   Penicillins Other (See Comments)   CHILDHOOD ALLERGY   Latex Rash        Medication List        Accurate as of March 02, 2021 10:35 AM. If you have any questions, ask your nurse or doctor.          Accu-Chek Guide test strip Generic drug: glucose blood USE ONCE DAILY TO TEST SUGAR   Accu-Chek Guide w/Device Kit USE ONCE DAILY TO TEST SUAGR   Accu-Chek Softclix Lancets lancets USE TO TEST ONCE DAILY   acetaminophen 650 MG CR tablet Commonly known as: TYLENOL Take 1,300 mg by mouth every 8 (eight) hours as needed for pain.   albuterol 108 (90 Base) MCG/ACT inhaler Commonly known as: VENTOLIN HFA Inhale 1 puff into the lungs every 6 (six) hours as needed for wheezing or shortness of breath.   aspirin EC 81 MG tablet Take 81 mg by mouth daily.   benztropine 1 MG tablet Commonly known as: COGENTIN Take 1 mg by mouth daily.   Breztri Aerosphere 160-9-4.8 MCG/ACT Aero Generic drug: Budeson-Glycopyrrol-Formoterol Inhale 2 puffs into the lungs in the morning and at bedtime.   candesartan 8 MG tablet Commonly known as: ATACAND Take 8 mg by mouth daily.   cetirizine 10 MG tablet Commonly known as: ZYRTEC Take 10 mg by mouth daily.   doxazosin 2 MG tablet Commonly known as: CARDURA TAKE 1 TABLET BY MOUTH DAILY   Emgality 120 MG/ML Soaj Generic drug: Galcanezumab-gnlm Inject 120 mg into the skin every 28 (twenty-eight) days.   finasteride 5 MG tablet Commonly known as: PROSCAR Take 5 mg by mouth daily.   fluticasone 50 MCG/ACT nasal spray Commonly known as: FLONASE Place 1 spray into both nostrils daily. Started by: Chesley Mires, MD   furosemide 40 MG tablet Commonly known as: LASIX Take 1 tablet (40 mg total) by mouth daily.    gabapentin 100 MG capsule Commonly known as: NEURONTIN Take by mouth.   meclizine 12.5 MG tablet Commonly known as: ANTIVERT Take 12.5 mg by mouth 3 (three) times daily as needed for dizziness.   Melatonin 10 MG Tabs Take 20 mg by mouth at bedtime.   metFORMIN 500 MG tablet Commonly known as: GLUCOPHAGE Take 500 mg by mouth daily.   metoprolol tartrate 50 MG tablet Commonly known as: LOPRESSOR TAKE 1 TABLET BY MOUTH TWICE DAILY   multivitamin tablet Take 2 tablets by mouth daily. Gummies   omeprazole 20 MG capsule Commonly known as: PRILOSEC Take 20  mg by mouth daily.   ondansetron 4 MG disintegrating tablet Commonly known as: Zofran ODT Take 1 tablet (4 mg total) by mouth every 8 (eight) hours as needed for nausea or vomiting.   oxyCODONE-acetaminophen 5-325 MG tablet Commonly known as: Percocet Take 1 tablet by mouth every 4 (four) hours as needed.   paliperidone 3 MG 24 hr tablet Commonly known as: INVEGA Take 3 mg by mouth daily.   phenazopyridine 100 MG tablet Commonly known as: Pyridium Take 1 tablet (100 mg total) by mouth 3 (three) times daily as needed for pain.   potassium chloride SA 20 MEQ tablet Commonly known as: KLOR-CON Take 1 tablet (20 mEq total) by mouth daily.   sertraline 100 MG tablet Commonly known as: ZOLOFT Take 100 mg by mouth at bedtime.   simvastatin 40 MG tablet Commonly known as: ZOCOR TAKE 1 TABLET BY MOUTH DAILY   tamsulosin 0.4 MG Caps capsule Commonly known as: FLOMAX Take 0.4 mg by mouth daily.   topiramate 50 MG tablet Commonly known as: TOPAMAX Take 50 mg by mouth daily.   traZODone 100 MG tablet Commonly known as: DESYREL Take 100 mg by mouth at bedtime.        Signature:  Chesley Mires, MD [place] Pager - [phone] 03/02/2021, 10:35 AM

## 2021-03-02 NOTE — Patient Instructions (Addendum)
Will check on status of getting pulmonary function test scheduled at [facility]  Will arrange for home sleep study  Will arrange for a portable oxygen concentrator  Use saline nasal spray nightly  Use flonase one spray in each nostril nighlty  Follow up in 4 months

## 2021-03-06 DIAGNOSIS — R69 Illness, unspecified: Secondary | ICD-10-CM | POA: Diagnosis not present

## 2021-03-16 DIAGNOSIS — M5441 Lumbago with sciatica, right side: Secondary | ICD-10-CM | POA: Diagnosis not present

## 2021-03-21 ENCOUNTER — Encounter: Payer: Self-pay | Admitting: Cardiology

## 2021-03-21 NOTE — Progress Notes (Signed)
Cardiology Office Note  Date: 03/22/2021   ID: [name], DOB 1960/10/04, MRN [id number]  PCP:  Bonnita Hollow, MD  Cardiologist:  Rozann Lesches, MD Electrophysiologist:  None   Chief Complaint  Patient presents with   Cardiac follow-up     History of Present Illness: Charles Mathews is a 60 y.o. male former patient of Dr. Bronson Ing now presenting to establish follow-up with me.  I reviewed his records and updated the chart.  He was last seen in February by Mr. Leonides Sake NP.  He is here today with caregiver.  Reports no angina symptoms, stable shortness of breath, no syncope.  He wears supplemental oxygen typically, did not have it on today.  He is following with [facility], had an office visit recently with Dr. Halford Chessman, I reviewed the note.  He has a benign right lower lobe hamartoma.  Also concern for sleep apnea with sleep study pending.  I reviewed his current medications which are noted below.  Diuretic dose has been stable.  He has not had any dramatic weight increase he continues to follow at [facility] for primary care.  I reviewed his follow-up cardiac testing from earlier in the year as noted below.  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[place])    Seizures ([place])     10/27/20- last one age 59-14   Urinary urgency     Past Surgical History:  Procedure Laterality Date   ARTERY BIOPSY N/A 10/28/2020   Procedure: BIOPSY TEMPORAL ARTERY;  Surgeon: Rosetta Posner, MD;  Location: [facility];  Service: Vascular;  Laterality: N/A;    CYSTOSCOPY WITH RETROGRADE PYELOGRAM, URETEROSCOPY AND STENT PLACEMENT Left 11/30/2020   Procedure: CYSTOSCOPY WITH LEFT RETROGRADE PYELOGRAM, LEFT URETEROSCOPY AND LEFT STENT PLACEMENT;  Surgeon: Cleon Gustin, MD;  Location: [facility];  Service: Urology;  Laterality: Left;   LITHOTRIPSY  2009   MULTIPLE EXTRACTIONS WITH ALVEOLOPLASTY N/A 12/17/2012   Procedure: MULTIPLE EXTRACTION WITH ALVEOLOPLASTY;  Surgeon: Gae Bon, DDS;  Location: [facility];  Service: Oral Surgery;  Laterality: N/A;    Current Outpatient Medications  Medication Sig Dispense Refill   ACCU-CHEK GUIDE test strip USE ONCE DAILY TO TEST SUGAR     Accu-Chek Softclix Lancets lancets USE TO TEST ONCE DAILY     acetaminophen (TYLENOL) 650 MG CR tablet Take 1,300 mg by mouth every 8 (eight) hours as needed for pain.     albuterol (VENTOLIN HFA) 108 (90 Base) MCG/ACT inhaler Inhale 1 puff into the lungs every 6 (six) hours as needed for wheezing or shortness of breath.     aspirin EC 81 MG tablet Take 81 mg by mouth daily.     benztropine (COGENTIN) 1 MG tablet Take 1 mg by mouth daily.     Blood Glucose Monitoring Suppl (ACCU-CHEK GUIDE) w/Device KIT USE ONCE DAILY TO TEST SUAGR  Budeson-Glycopyrrol-Formoterol (BREZTRI AEROSPHERE) 160-9-4.8 MCG/ACT AERO Inhale 2 puffs into the lungs in the morning and at bedtime.     candesartan (ATACAND) 8 MG tablet Take 8 mg by mouth daily.     cetirizine (ZYRTEC) 10 MG tablet Take 10 mg by mouth daily.     doxazosin (CARDURA) 2 MG tablet TAKE 1 TABLET BY MOUTH DAILY 30 tablet 6   finasteride (PROSCAR) 5 MG tablet Take 5 mg by mouth daily.     fluticasone (FLONASE) 50 MCG/ACT nasal spray Place 1 spray into both nostrils daily. 16 g 2   furosemide (LASIX) 40 MG tablet Take 1 tablet (40 mg total) by mouth daily. 90 tablet 1   gabapentin (NEURONTIN) 100 MG capsule Take by mouth.     Galcanezumab-gnlm (EMGALITY) 120 MG/ML SOAJ Inject 120 mg into the skin every 28 (twenty-eight) days. 1.12 mL  5   meclizine (ANTIVERT) 12.5 MG tablet Take 12.5 mg by mouth 3 (three) times daily as needed for dizziness.     Melatonin 10 MG TABS Take 20 mg by mouth at bedtime.     metFORMIN (GLUCOPHAGE) 500 MG tablet Take 500 mg by mouth daily.     metoprolol tartrate (LOPRESSOR) 50 MG tablet TAKE 1 TABLET BY MOUTH TWICE DAILY 60 tablet 6   Multiple Vitamin (MULTIVITAMIN) tablet Take 2 tablets by mouth daily. Gummies     omeprazole (PRILOSEC) 20 MG capsule Take 20 mg by mouth daily.     ondansetron (ZOFRAN ODT) 4 MG disintegrating tablet Take 1 tablet (4 mg total) by mouth every 8 (eight) hours as needed for nausea or vomiting. 20 tablet 5   oxyCODONE-acetaminophen (PERCOCET) 5-325 MG tablet Take 1 tablet by mouth every 4 (four) hours as needed. 30 tablet 0   paliperidone (INVEGA) 3 MG 24 hr tablet Take 3 mg by mouth daily.     phenazopyridine (PYRIDIUM) 100 MG tablet Take 1 tablet (100 mg total) by mouth 3 (three) times daily as needed for pain. 15 tablet 0   potassium chloride SA (KLOR-CON) 20 MEQ tablet Take 1 tablet (20 mEq total) by mouth daily. 90 tablet 1   sertraline (ZOLOFT) 100 MG tablet Take 100 mg by mouth at bedtime.     tamsulosin (FLOMAX) 0.4 MG CAPS capsule Take 0.4 mg by mouth daily.     topiramate (TOPAMAX) 50 MG tablet Take 50 mg by mouth daily.     traZODone (DESYREL) 100 MG tablet Take 100 mg by mouth at bedtime.     No current facility-administered medications for this visit.   Allergies:  Bee venom, Benadryl [diphenhydramine], Lorazepam, Penicillins, and Latex   ROS: Indigestion.  Physical Exam: VS:  BP 124/72   Pulse (!) 52   Ht 5' 9" (1.753 m)   Wt (!) 337 lb 6.4 oz (153 kg)   SpO2 (!) 84%   BMI 49.83 kg/m , BMI Body mass index is 49.83 kg/m.  Wt Readings from Last 3 Encounters:  03/22/21 (!) 337 lb 6.4 oz (153 kg)  03/02/21 (!) 333 lb (151 kg)  12/07/20 (!) 327 lb (148.3 kg)    General: Obese male, appears comfortable at rest. HEENT: Conjunctiva and lids normal,  wearing a mask. Neck: Supple, increased girth without obvious elevated JVP or carotid bruits. Lungs: Decreased breath sounds throughout without wheezing, nonlabored breathing at rest. Cardiac: Distant, regular rate and rhythm, no S3 or significant systolic murmur, no pericardial rub. Abdomen: Protuberant, bowel sounds present.  ECG:  An ECG dated 08/06/2020 was  personally reviewed today and demonstrated:  Sinus rhythm.  Recent Labwork: 11/27/2020: Platelets 130 11/30/2020: BUN 25; Creatinine, Ser 1.20; Hemoglobin 13.6; Potassium 4.3; Sodium 142   Other Studies Reviewed Today:  Echocardiogram 08/28/2020:  1. Left ventricular ejection fraction, by estimation, is 55 to 60%. The  left ventricle has normal function. The left ventricle has no regional  wall motion abnormalities. There is mild left ventricular hypertrophy.  Left ventricular diastolic parameters  were normal.   2. Right ventricular systolic function is normal. The right ventricular  size is normal.   3. The mitral valve is normal in structure. No evidence of mitral valve  regurgitation. No evidence of mitral stenosis.   4. The aortic valve is tricuspid. Aortic valve regurgitation is not  visualized. No aortic stenosis is present.   Lexiscan Myoview 09/01/2020: No diagnostic ST segment changes to indicate ischemia. Small, mild intensity, apical anteroseptal and basal inferolateral defects that are fixed and most consistent with soft tissue attenuation. This is a low risk study. Nuclear stress EF: 52%.  Chest CT 02/04/2021: FINDINGS: Cardiovascular: Heart is enlarged.  No pericardial effusion.   Mediastinum/Nodes: No pathologically enlarged mediastinal or axillary lymph nodes. Hilar regions are difficult to definitively evaluate without IV contrast. Esophagus is grossly unremarkable.   Lungs/Pleura: Image quality is degraded by respiratory motion and expiratory phase imaging. 6 mm nodule along the minor fissure, unchanged and  likely a benign subpleural lymph node. Calcified granulomas. Mixed attenuation mass in the right lower lobe contains fat and calcium and measures 3.8 x 4.3 cm, stable from 08/28/2020 and smaller but present on exams dating back to 03/31/2011. No pleural fluid. Airway is grossly unremarkable.   Upper Abdomen: Visualized portions of the liver, adrenal glands, kidneys, spleen, pancreas, stomach and bowel are grossly unremarkable.   Musculoskeletal: Degenerative changes in the spine. No worrisome lytic or sclerotic lesions.   IMPRESSION: Right lower lobe hamartoma, present on exams dating back to 04/01/2011.  Assessment and Plan:  1.  Essential hypertension, blood pressure is well controlled today.  Currently on Lopressor and Atacand.  No changes made today.  2.  History of palpitations, quiescent on beta-blocker.  3.  History of fluid retention, LVEF and diastolic function normal by echocardiogram in January.  He is on stable dose of Lasix with potassium supplement.  4.  Chronic hypoxic respiratory failure with concern for OSH/OSA.  He is following with Dr. Halford Chessman with further sleep testing planned.  He uses supplemental oxygen at home.  5.  Benign right lower lobe hamartoma, stable by CT imaging.  Medication Adjustments/Labs and Tests Ordered: Current medicines are reviewed at length with the patient today.  Concerns regarding medicines are outlined above.   Tests Ordered: No orders of the defined types were placed in this encounter.   Medication Changes: No orders of the defined types were placed in this encounter.   Disposition:  Follow up  6 months.  Signed, Satira Sark, MD, [facility] 03/22/2021 11:07 AM    [facility], [place], [place] [address] Phone: [phone]; Fax: [phone]

## 2021-03-22 ENCOUNTER — Ambulatory Visit (INDEPENDENT_AMBULATORY_CARE_PROVIDER_SITE_OTHER): Payer: Medicare Other | Admitting: Cardiology

## 2021-03-22 ENCOUNTER — Encounter: Payer: Self-pay | Admitting: Cardiology

## 2021-03-22 VITALS — BP 124/72 | HR 52 | Ht 69.0 in | Wt 337.4 lb

## 2021-03-22 DIAGNOSIS — J411 Mucopurulent chronic bronchitis: Secondary | ICD-10-CM | POA: Diagnosis not present

## 2021-03-22 DIAGNOSIS — Q859 Phakomatosis, unspecified: Secondary | ICD-10-CM | POA: Diagnosis not present

## 2021-03-22 DIAGNOSIS — R002 Palpitations: Secondary | ICD-10-CM | POA: Diagnosis not present

## 2021-03-22 DIAGNOSIS — Z9189 Other specified personal risk factors, not elsewhere classified: Secondary | ICD-10-CM

## 2021-03-22 DIAGNOSIS — J449 Chronic obstructive pulmonary disease, unspecified: Secondary | ICD-10-CM | POA: Diagnosis not present

## 2021-03-22 DIAGNOSIS — I10 Essential (primary) hypertension: Secondary | ICD-10-CM

## 2021-03-22 NOTE — Patient Instructions (Signed)

## 2021-03-29 DIAGNOSIS — G43711 Chronic migraine without aura, intractable, with status migrainosus: Secondary | ICD-10-CM | POA: Diagnosis not present

## 2021-03-29 DIAGNOSIS — J449 Chronic obstructive pulmonary disease, unspecified: Secondary | ICD-10-CM | POA: Diagnosis not present

## 2021-03-29 DIAGNOSIS — R0902 Hypoxemia: Secondary | ICD-10-CM | POA: Diagnosis not present

## 2021-03-29 DIAGNOSIS — G4733 Obstructive sleep apnea (adult) (pediatric): Secondary | ICD-10-CM | POA: Diagnosis not present

## 2021-03-29 DIAGNOSIS — M5441 Lumbago with sciatica, right side: Secondary | ICD-10-CM | POA: Diagnosis not present

## 2021-03-29 DIAGNOSIS — E1122 Type 2 diabetes mellitus with diabetic chronic kidney disease: Secondary | ICD-10-CM | POA: Diagnosis not present

## 2021-04-07 DIAGNOSIS — H6091 Unspecified otitis externa, right ear: Secondary | ICD-10-CM | POA: Diagnosis not present

## 2021-04-07 DIAGNOSIS — H6691 Otitis media, unspecified, right ear: Secondary | ICD-10-CM | POA: Diagnosis not present

## 2021-04-12 ENCOUNTER — Other Ambulatory Visit: Payer: Self-pay

## 2021-04-12 ENCOUNTER — Ambulatory Visit: Payer: Medicare Other

## 2021-04-12 DIAGNOSIS — G4733 Obstructive sleep apnea (adult) (pediatric): Secondary | ICD-10-CM

## 2021-04-15 ENCOUNTER — Telehealth: Payer: Self-pay | Admitting: Pulmonary Disease

## 2021-04-15 DIAGNOSIS — G4733 Obstructive sleep apnea (adult) (pediatric): Secondary | ICD-10-CM | POA: Diagnosis not present

## 2021-04-15 NOTE — Telephone Encounter (Signed)
HST 04/12/21 >> AHI 53.3, SpO2 low 59%.  Spent 633.7 min with SpO2 < 89%.    Please inform him that his sleep study shows very severe obstructive sleep apnea.  Options are to arrange for CPAP titration study now, or arrange for office appointment to discuss in more detail.  If he opts for office appointment, then this can be with me or NP, and can be a video/tele visit.

## 2021-04-16 NOTE — Telephone Encounter (Signed)
Called and spoke with Charles Mathews patient's aide per DPR to go over HST results. She expressed understanding and stated they would like to make an OV. Patient has been scheduled for OV with Dr. Halford Chessman in [place] for 06/14/21. Nothing further needed at this time.

## 2021-04-22 DIAGNOSIS — J449 Chronic obstructive pulmonary disease, unspecified: Secondary | ICD-10-CM | POA: Diagnosis not present

## 2021-04-22 DIAGNOSIS — J411 Mucopurulent chronic bronchitis: Secondary | ICD-10-CM | POA: Diagnosis not present

## 2021-05-17 DIAGNOSIS — Z8781 Personal history of (healed) traumatic fracture: Secondary | ICD-10-CM | POA: Diagnosis not present

## 2021-05-17 DIAGNOSIS — G4733 Obstructive sleep apnea (adult) (pediatric): Secondary | ICD-10-CM | POA: Diagnosis not present

## 2021-05-17 DIAGNOSIS — E559 Vitamin D deficiency, unspecified: Secondary | ICD-10-CM | POA: Diagnosis not present

## 2021-05-17 DIAGNOSIS — E782 Mixed hyperlipidemia: Secondary | ICD-10-CM | POA: Diagnosis not present

## 2021-05-17 DIAGNOSIS — I10 Essential (primary) hypertension: Secondary | ICD-10-CM | POA: Diagnosis not present

## 2021-05-17 DIAGNOSIS — Z23 Encounter for immunization: Secondary | ICD-10-CM | POA: Diagnosis not present

## 2021-05-17 DIAGNOSIS — E1165 Type 2 diabetes mellitus with hyperglycemia: Secondary | ICD-10-CM | POA: Diagnosis not present

## 2021-05-22 DIAGNOSIS — J411 Mucopurulent chronic bronchitis: Secondary | ICD-10-CM | POA: Diagnosis not present

## 2021-05-22 DIAGNOSIS — J449 Chronic obstructive pulmonary disease, unspecified: Secondary | ICD-10-CM | POA: Diagnosis not present

## 2021-05-23 NOTE — Progress Notes (Deleted)
NEUROLOGY FOLLOW UP OFFICE NOTE  Charles Mathews [id number]  Assessment/Plan:   1  Chronic migraine with and without aura, without status migrainosus, not intractable 2  Morbid obesity  Migraine prevention:  *** Migraine rescue:  *** Limit use of pain relievers to no more than 2 days out of week to prevent risk of rebound or medication-overuse headache. Keep headache diary Follow up ***   Subjective:  Charles Mathews is a 60 year old left-handed male with HTN, CAD, COPD, depression, bipolar disorder and history of childhood seizures who follows up for migraines.  UPDATE: Started Emgality in April. Intensity:  *** Duration:  *** Frequency:  *** Frequency of abortive medication: *** Current NSAIDS/analgesics:  Tylenol, ASA 45m,  Current triptans:  none Current ergotamine:  none Current anti-emetic:  none Current muscle relaxants:  none Current Antihypertensive medications:  Metoprolol, candestartan, furosemide Current Antidepressant medications: sertraline 1087mQD, trazodone Current Anticonvulsant medications:  topiramate 5032mHS (has some facial numbness) Current anti-CGRP:  Emgality Current Vitamins/Herbal/Supplements:  K Current Antihistamines/Decongestants:  Zyrtec Other therapy:  rest Hormone/birth control:  none Other medications:  Paliperidone, benztropine, stiolto Respimat, Xanax, albuterol, O2  Caffeine:  Sodas, diet peach tea daily.  No coffee Diet:  Very little water.  Sometimes may skip meals. Exercise:  No.  Sometimes uses the stationary bike Depression:  yes; Anxiety:  yes Other pain:  Joint pain/arthritis, neuropathy Sleep:  diagnosed with severe OSA.  ***  HISTORY:  He reports history of headaches since his 40s47sThey are severe left temporal or right parietal pressure in.  Associated nausea, photophobia, phonophobia, visual disturbance (may see a house that isn't there), sometimes numbness over the scalp.  Last 20 minutes with Tylenol and rest but  will return later that evening.  They occur daily.  No known triggers.  He takes Tylenol twice daily.      Past NSAIDS/analgesics:  Naproxen 500m66mioricet with codeine, Tylenol with codeine Past abortive triptans:  none Past abortive ergotamine:  none Past muscle relaxants:  none Past anti-emetic:  none Past antihypertensive medications:  none Past antidepressant medications:  Does not remember Past anticonvulsant medications:  none Past anti-CGRP:  none Past vitamins/Herbal/Supplements:  none Past antihistamines/decongestants:  none Other past therapies:  none    Family history of headache:  Adopted.  PAST MEDICAL HISTORY: Past Medical History:  Diagnosis Date   Anxiety    Arthritis    Asthma    Bipolar disorder (HCC)[place] Chronic back pain    COPD (chronic obstructive pulmonary disease) (HCC)    Essential hypertension    Gastric ulcer    GERD (gastroesophageal reflux disease)    Head injury    History of kidney stones    Insomnia    Major depression    Mentally challenged    Migraine    Mixed hyperlipidemia    Morbid obesity (HCC)[place] Neuropathy    Pre-diabetes    Pulmonary hamartoma (HCC)[place] Seizures (HCC)[place]  10/27/20- last one age 82-150-14rinary urgency     MEDICATIONS: Current Outpatient Medications on File Prior to Visit  Medication Sig Dispense Refill   ACCU-CHEK GUIDE test strip USE ONCE DAILY TO TEST SUGAR     Accu-Chek Softclix Lancets lancets USE TO TEST ONCE DAILY     acetaminophen (TYLENOL) 650 MG CR tablet Take 1,300 mg by mouth every 8 (eight) hours as needed for pain.     albuterol (  VENTOLIN HFA) 108 (90 Base) MCG/ACT inhaler Inhale 1 puff into the lungs every 6 (six) hours as needed for wheezing or shortness of breath.     aspirin EC 81 MG tablet Take 81 mg by mouth daily.     benztropine (COGENTIN) 1 MG tablet Take 1 mg by mouth daily.     Blood Glucose Monitoring Suppl (ACCU-CHEK GUIDE) w/Device KIT USE ONCE DAILY TO TEST SUAGR      Budeson-Glycopyrrol-Formoterol (BREZTRI AEROSPHERE) 160-9-4.8 MCG/ACT AERO Inhale 2 puffs into the lungs in the morning and at bedtime.     candesartan (ATACAND) 8 MG tablet Take 8 mg by mouth daily.     cetirizine (ZYRTEC) 10 MG tablet Take 10 mg by mouth daily.     doxazosin (CARDURA) 2 MG tablet TAKE 1 TABLET BY MOUTH DAILY 30 tablet 6   finasteride (PROSCAR) 5 MG tablet Take 5 mg by mouth daily.     fluticasone (FLONASE) 50 MCG/ACT nasal spray Place 1 spray into both nostrils daily. 16 g 2   furosemide (LASIX) 40 MG tablet Take 1 tablet (40 mg total) by mouth daily. 90 tablet 1   gabapentin (NEURONTIN) 100 MG capsule Take by mouth.     Galcanezumab-gnlm (EMGALITY) 120 MG/ML SOAJ Inject 120 mg into the skin every 28 (twenty-eight) days. 1.12 mL 5   meclizine (ANTIVERT) 12.5 MG tablet Take 12.5 mg by mouth 3 (three) times daily as needed for dizziness.     Melatonin 10 MG TABS Take 20 mg by mouth at bedtime.     metFORMIN (GLUCOPHAGE) 500 MG tablet Take 500 mg by mouth daily.     metoprolol tartrate (LOPRESSOR) 50 MG tablet TAKE 1 TABLET BY MOUTH TWICE DAILY 60 tablet 6   Multiple Vitamin (MULTIVITAMIN) tablet Take 2 tablets by mouth daily. Gummies     omeprazole (PRILOSEC) 20 MG capsule Take 20 mg by mouth daily.     ondansetron (ZOFRAN ODT) 4 MG disintegrating tablet Take 1 tablet (4 mg total) by mouth every 8 (eight) hours as needed for nausea or vomiting. 20 tablet 5   oxyCODONE-acetaminophen (PERCOCET) 5-325 MG tablet Take 1 tablet by mouth every 4 (four) hours as needed. 30 tablet 0   paliperidone (INVEGA) 3 MG 24 hr tablet Take 3 mg by mouth daily.     phenazopyridine (PYRIDIUM) 100 MG tablet Take 1 tablet (100 mg total) by mouth 3 (three) times daily as needed for pain. 15 tablet 0   potassium chloride SA (KLOR-CON) 20 MEQ tablet Take 1 tablet (20 mEq total) by mouth daily. 90 tablet 1   sertraline (ZOLOFT) 100 MG tablet Take 100 mg by mouth at bedtime.     tamsulosin (FLOMAX) 0.4 MG  CAPS capsule Take 0.4 mg by mouth daily.     topiramate (TOPAMAX) 50 MG tablet Take 50 mg by mouth daily.     traZODone (DESYREL) 100 MG tablet Take 100 mg by mouth at bedtime.     No current facility-administered medications on file prior to visit.    ALLERGIES: Allergies  Allergen Reactions   Bee Venom Anaphylaxis   Benadryl [Diphenhydramine]     Makes him feel weird, like he is going to pass out   Lorazepam Other (See Comments)    Makes him feel woozy.   Penicillins Other (See Comments)    CHILDHOOD ALLERGY   Latex Rash    FAMILY HISTORY: Family History  Adopted: Yes      Objective:  *** General: No acute distress.  Patient appears ***-groomed.   Head:  Normocephalic/atraumatic Eyes:  Fundi examined but not visualized Neck: supple, no paraspinal tenderness, full range of motion Heart:  Regular rate and rhythm Lungs:  Clear to auscultation bilaterally Back: No paraspinal tenderness Neurological Exam: alert and oriented to person, place, and time.  Speech fluent and not dysarthric, language intact.  CN II-XII intact. Bulk and tone normal, muscle strength 5/5 throughout.  Sensation to light touch intact.  Deep tendon reflexes 2+ throughout, toes downgoing.  Finger to nose testing intact.  Gait normal, Romberg negative.   Metta Clines, DO  CC: Josephine Igo, MD

## 2021-05-24 ENCOUNTER — Ambulatory Visit: Payer: Medicare Other | Admitting: Neurology

## 2021-05-25 DIAGNOSIS — I10 Essential (primary) hypertension: Secondary | ICD-10-CM | POA: Diagnosis not present

## 2021-05-25 DIAGNOSIS — R112 Nausea with vomiting, unspecified: Secondary | ICD-10-CM | POA: Diagnosis not present

## 2021-05-25 DIAGNOSIS — Z79899 Other long term (current) drug therapy: Secondary | ICD-10-CM | POA: Diagnosis not present

## 2021-05-25 DIAGNOSIS — Z888 Allergy status to other drugs, medicaments and biological substances status: Secondary | ICD-10-CM | POA: Diagnosis not present

## 2021-05-25 DIAGNOSIS — R0602 Shortness of breath: Secondary | ICD-10-CM | POA: Diagnosis not present

## 2021-05-25 DIAGNOSIS — R1110 Vomiting, unspecified: Secondary | ICD-10-CM | POA: Diagnosis not present

## 2021-05-25 DIAGNOSIS — R109 Unspecified abdominal pain: Secondary | ICD-10-CM | POA: Diagnosis not present

## 2021-05-25 DIAGNOSIS — R309 Painful micturition, unspecified: Secondary | ICD-10-CM | POA: Diagnosis not present

## 2021-05-25 DIAGNOSIS — R748 Abnormal levels of other serum enzymes: Secondary | ICD-10-CM | POA: Diagnosis not present

## 2021-05-25 DIAGNOSIS — D72829 Elevated white blood cell count, unspecified: Secondary | ICD-10-CM | POA: Diagnosis not present

## 2021-05-25 DIAGNOSIS — R0902 Hypoxemia: Secondary | ICD-10-CM | POA: Diagnosis not present

## 2021-05-25 DIAGNOSIS — Z880 Allergy status to penicillin: Secondary | ICD-10-CM | POA: Diagnosis not present

## 2021-05-25 DIAGNOSIS — K5730 Diverticulosis of large intestine without perforation or abscess without bleeding: Secondary | ICD-10-CM | POA: Diagnosis not present

## 2021-05-25 DIAGNOSIS — J811 Chronic pulmonary edema: Secondary | ICD-10-CM | POA: Diagnosis not present

## 2021-05-25 DIAGNOSIS — R1084 Generalized abdominal pain: Secondary | ICD-10-CM | POA: Diagnosis not present

## 2021-05-25 DIAGNOSIS — Z20822 Contact with and (suspected) exposure to covid-19: Secondary | ICD-10-CM | POA: Diagnosis not present

## 2021-05-25 DIAGNOSIS — I517 Cardiomegaly: Secondary | ICD-10-CM | POA: Diagnosis not present

## 2021-05-25 DIAGNOSIS — K529 Noninfective gastroenteritis and colitis, unspecified: Secondary | ICD-10-CM | POA: Diagnosis not present

## 2021-05-25 DIAGNOSIS — R531 Weakness: Secondary | ICD-10-CM | POA: Diagnosis not present

## 2021-05-25 DIAGNOSIS — Z743 Need for continuous supervision: Secondary | ICD-10-CM | POA: Diagnosis not present

## 2021-05-25 DIAGNOSIS — J449 Chronic obstructive pulmonary disease, unspecified: Secondary | ICD-10-CM | POA: Diagnosis not present

## 2021-05-27 ENCOUNTER — Encounter (INDEPENDENT_AMBULATORY_CARE_PROVIDER_SITE_OTHER): Payer: Self-pay | Admitting: *Deleted

## 2021-05-31 DIAGNOSIS — I2510 Atherosclerotic heart disease of native coronary artery without angina pectoris: Secondary | ICD-10-CM | POA: Diagnosis not present

## 2021-05-31 DIAGNOSIS — I10 Essential (primary) hypertension: Secondary | ICD-10-CM | POA: Diagnosis not present

## 2021-05-31 DIAGNOSIS — Z87891 Personal history of nicotine dependence: Secondary | ICD-10-CM | POA: Diagnosis not present

## 2021-05-31 DIAGNOSIS — J441 Chronic obstructive pulmonary disease with (acute) exacerbation: Secondary | ICD-10-CM | POA: Diagnosis not present

## 2021-06-14 ENCOUNTER — Other Ambulatory Visit: Payer: Self-pay

## 2021-06-14 ENCOUNTER — Encounter: Payer: Self-pay | Admitting: Pulmonary Disease

## 2021-06-14 ENCOUNTER — Other Ambulatory Visit: Payer: Self-pay | Admitting: Neurology

## 2021-06-14 ENCOUNTER — Ambulatory Visit (INDEPENDENT_AMBULATORY_CARE_PROVIDER_SITE_OTHER): Payer: Medicare Other | Admitting: Pulmonary Disease

## 2021-06-14 VITALS — BP 128/80 | HR 65 | Temp 98.7°F | Ht 69.0 in | Wt 330.1 lb

## 2021-06-14 DIAGNOSIS — E662 Morbid (severe) obesity with alveolar hypoventilation: Secondary | ICD-10-CM | POA: Diagnosis not present

## 2021-06-14 DIAGNOSIS — Z6841 Body Mass Index (BMI) 40.0 and over, adult: Secondary | ICD-10-CM

## 2021-06-14 DIAGNOSIS — G4733 Obstructive sleep apnea (adult) (pediatric): Secondary | ICD-10-CM | POA: Diagnosis not present

## 2021-06-14 DIAGNOSIS — J411 Mucopurulent chronic bronchitis: Secondary | ICD-10-CM | POA: Diagnosis not present

## 2021-06-14 DIAGNOSIS — G4734 Idiopathic sleep related nonobstructive alveolar hypoventilation: Secondary | ICD-10-CM

## 2021-06-14 DIAGNOSIS — J9611 Chronic respiratory failure with hypoxia: Secondary | ICD-10-CM | POA: Diagnosis not present

## 2021-06-14 NOTE — Patient Instructions (Signed)
Will arrange for CPAP/Bipap titration study at [facility]  Will call to arrange follow up after sleep study reviewed

## 2021-06-14 NOTE — Progress Notes (Signed)
[facility]  Chief Complaint  Patient presents with   Follow-up    SOB is about the same since last OV. 3LO2 prn per patient but is supposed to wear it all the time. Appt to discuss OSA options.   Past Surgical History:  He  has a past surgical history that includes Lithotripsy (2009); Multiple extractions with alveoloplasty (N/A, 12/17/2012); Artery Biopsy (N/A, 10/28/2020); and Cystoscopy with retrograde pyelogram, ureteroscopy and stent placement (Left, 11/30/2020).  Past Medical History:  Anxiety, OA, Asthma, Back pain, Nephrolithiasis, CAD, DM, GERD, Gastric ulcer, HLD, HTN, Diastolic CHF, Depression, Neuropathy, Seizures  Constitutional:  BP 128/80 (BP Location: Right Wrist, Patient Position: Sitting)   Pulse 65   Temp 98.7 F (37.1 C) (Temporal)   Ht '5\' 9"'  (1.753 m)   Wt (!) 330 lb 1.3 oz (149.7 kg)   SpO2 93% Comment: ra  BMI 48.74 kg/m   Brief Summary:  Charles Mathews is a 60 y.o. male former smoker with obstructive sleep apnea, obesity hypoventilation syndrome, chronic respiratory failure with hypoxia, and presumed COPD.  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      Subjective:   He is accompanied by his CNA.   He had home sleep study.  Showed severe sleep apnea with prolonged hypoxemia.  He wakes up from a dream feeling like he being smothered.  This makes him feel anxious.  He hasn't been consistently using supplemental oxygen with activity.  He feels tired and clammy when he does activity without his oxygen on.  He had DME assess for POC, but was told he needed 5 liters with exertion and therefore not a candidate for POC.  He is not having cough, wheeze, or sputum.  Physical Exam:   Appearance - well kempt   ENMT - no sinus tenderness, no oral exudate, no LAN, Mallampati 4 airway, no stridor, edentulous  Respiratory - equal breath sounds bilaterally, no wheezing or rales  CV - s1s2 regular rate  and rhythm, no murmurs  Ext - no clubbing, no edema  Skin - no rashes  Psych - normal mood and affect    Pulmonary testing:    Chest Imaging:  CT chest 04/01/11 >> 3.5 x 2.9 mass RLL PET scan 11/15/12 >> fat density and calcifications in Rt lower lung mass 2.9 cm CT chest 06/05/13 >> 3.5 x 3.1 cm RLL mass with fat and calcification CT chest 08/28/20 >> superior segment RLL hamartoma 4.2 x 4 cm  CT chest 02/05/21 >> no change in size of hamartoma  Sleep Tests:  HST 04/12/21 >> AHI 53.3, SpO2 low 59%.  Spent 633.7 min with SpO2 < 89%.  Cardiac Tests:  Echo 08/28/20 >> EF 55 to 60%, mild LVH  Social History:  He  reports that he quit smoking about 18 years ago. His smoking use included cigarettes. He has a 54.00 pack-year smoking history. He has been exposed to tobacco smoke. He has never used smokeless tobacco. He reports that he does not drink alcohol and does not use drugs.  Family History:  His family history is not on file. He was adopted.     Assessment/Plan:   History of tobacco abuse with reported history of COPD. - continue breztri and prn albuterol - has has not been able to get PFT set up at [facility]; will reschedule for [facility] office at his next follow up after he has his titration study completed  Obstructive sleep apnea. -  reviewed his sleep study results - discussed how sleep apnea can impact his health - treatment options reviewed - will arrange for CPAP titration study at [facility]; he can be transitioned to Bipap with supplemental oxygen as needed  Obesity hypoventilation syndrome. - continue 3 to 5 liters oxygen with exertion and sleep for now - goal SpO2 > 90% - discussed importance of using supplemental oxygen consistently  Morbid obesity with BMI 48.74. - discussed importance of weight loss  Palpitations, Chronic diastolic CHF, Hypertension, Hyperlipidemia. - followed by Dr. Johnny Bridge with [facility]  Time Spent Involved in Patient Care on Day of  Examination:  33 minutes  Follow up:   Patient Instructions  Will arrange for CPAP/Bipap titration study at [facility]  Will call to arrange follow up after sleep study reviewed  Medication List:   Allergies as of 06/14/2021       Reactions   Bee Venom Anaphylaxis   Benadryl [diphenhydramine]    Makes him feel weird, like he is going to pass out   Lorazepam Other (See Comments)   Makes him feel woozy.   Penicillins Other (See Comments)   CHILDHOOD ALLERGY   Latex Rash        Medication List        Accurate as of June 14, 2021 11:16 AM. If you have any questions, ask your nurse or doctor.          Accu-Chek Guide test strip Generic drug: glucose blood USE ONCE DAILY TO TEST SUGAR   Accu-Chek Guide w/Device Kit USE ONCE DAILY TO TEST SUAGR   Accu-Chek Softclix Lancets lancets USE TO TEST ONCE DAILY   acetaminophen 650 MG CR tablet Commonly known as: TYLENOL Take 1,300 mg by mouth every 8 (eight) hours as needed for pain.   albuterol 108 (90 Base) MCG/ACT inhaler Commonly known as: VENTOLIN HFA Inhale 1 puff into the lungs every 6 (six) hours as needed for wheezing or shortness of breath.   aspirin EC 81 MG tablet Take 81 mg by mouth daily.   benztropine 1 MG tablet Commonly known as: COGENTIN Take 1 mg by mouth daily.   Breztri Aerosphere 160-9-4.8 MCG/ACT Aero Generic drug: Budeson-Glycopyrrol-Formoterol Inhale 2 puffs into the lungs in the morning and at bedtime.   candesartan 8 MG tablet Commonly known as: ATACAND Take 8 mg by mouth daily.   cetirizine 10 MG tablet Commonly known as: ZYRTEC Take 10 mg by mouth daily.   doxazosin 2 MG tablet Commonly known as: CARDURA TAKE 1 TABLET BY MOUTH DAILY   Emgality 120 MG/ML Soaj Generic drug: Galcanezumab-gnlm INJECT 120 MG into THE SKIN EVERY 28 DAYS What changed: See the new instructions. Changed by: Dudley Major, DO   finasteride 5 MG tablet Commonly known as:  PROSCAR Take 5 mg by mouth daily.   fluticasone 50 MCG/ACT nasal spray Commonly known as: FLONASE Place 1 spray into both nostrils daily.   furosemide 40 MG tablet Commonly known as: LASIX Take 1 tablet (40 mg total) by mouth daily.   gabapentin 100 MG capsule Commonly known as: NEURONTIN Take by mouth.   meclizine 12.5 MG tablet Commonly known as: ANTIVERT Take 12.5 mg by mouth 3 (three) times daily as needed for dizziness.   Melatonin 10 MG Tabs Take 20 mg by mouth at bedtime.   metFORMIN 500 MG tablet Commonly known as: GLUCOPHAGE Take 500 mg by mouth daily.   metoprolol tartrate 50 MG tablet Commonly known as: LOPRESSOR  TAKE 1 TABLET BY MOUTH TWICE DAILY   multivitamin tablet Take 2 tablets by mouth daily. Gummies   omeprazole 20 MG capsule Commonly known as: PRILOSEC Take 20 mg by mouth daily.   ondansetron 4 MG disintegrating tablet Commonly known as: Zofran ODT Take 1 tablet (4 mg total) by mouth every 8 (eight) hours as needed for nausea or vomiting.   oxyCODONE-acetaminophen 5-325 MG tablet Commonly known as: Percocet Take 1 tablet by mouth every 4 (four) hours as needed.   paliperidone 3 MG 24 hr tablet Commonly known as: INVEGA Take 3 mg by mouth daily.   phenazopyridine 100 MG tablet Commonly known as: Pyridium Take 1 tablet (100 mg total) by mouth 3 (three) times daily as needed for pain.   potassium chloride SA 20 MEQ tablet Commonly known as: KLOR-CON Take 1 tablet (20 mEq total) by mouth daily.   sertraline 100 MG tablet Commonly known as: ZOLOFT Take 100 mg by mouth at bedtime.   tamsulosin 0.4 MG Caps capsule Commonly known as: FLOMAX Take 0.4 mg by mouth daily.   topiramate 50 MG tablet Commonly known as: TOPAMAX Take 50 mg by mouth daily.   traZODone 100 MG tablet Commonly known as: DESYREL Take 100 mg by mouth at bedtime.        Signature:  Chesley Mires, MD [place] Pager - [phone] 06/14/2021, 11:16 AM

## 2021-06-15 ENCOUNTER — Other Ambulatory Visit: Payer: Self-pay | Admitting: Pulmonary Disease

## 2021-06-21 ENCOUNTER — Encounter (INDEPENDENT_AMBULATORY_CARE_PROVIDER_SITE_OTHER): Payer: Self-pay

## 2021-06-21 ENCOUNTER — Encounter (INDEPENDENT_AMBULATORY_CARE_PROVIDER_SITE_OTHER): Payer: Self-pay | Admitting: Gastroenterology

## 2021-06-21 ENCOUNTER — Other Ambulatory Visit: Payer: Self-pay

## 2021-06-21 ENCOUNTER — Other Ambulatory Visit (INDEPENDENT_AMBULATORY_CARE_PROVIDER_SITE_OTHER): Payer: Self-pay

## 2021-06-21 ENCOUNTER — Ambulatory Visit (INDEPENDENT_AMBULATORY_CARE_PROVIDER_SITE_OTHER): Payer: Medicare Other | Admitting: Gastroenterology

## 2021-06-21 VITALS — BP 175/84 | HR 71 | Temp 98.8°F | Ht 69.0 in | Wt 328.0 lb

## 2021-06-21 DIAGNOSIS — R1030 Lower abdominal pain, unspecified: Secondary | ICD-10-CM

## 2021-06-21 DIAGNOSIS — R1310 Dysphagia, unspecified: Secondary | ICD-10-CM

## 2021-06-21 MED ORDER — DICYCLOMINE HCL 10 MG PO CAPS: 10.0000 mg | ORAL_CAPSULE | Freq: Three times a day (TID) | ORAL | 3 refills | Status: DC | PRN

## 2021-06-21 NOTE — Progress Notes (Signed)
Referring Provider: Bonnita Hollow, MD Primary Care Physician:  Charles Hollow, MD Primary GI Physician: new patient, previously Dr. Laural Golden  Chief Complaint  Patient presents with   Diverticulitis    Pt arrives with caregiver Charles Mathews. Pt having vomiting, nausea and diarrhea. Also has concerns of hiatial hernia.    HPI:   Charles Mathews is a 60 y.o. male with past medical history of anxiety, Bipolar, COPD, HTN, Gastric ulcer, GERD, depression, migraines, HLD, neuropathy.   Patient presenting today as a new patient with c/o dysphagia and abdominal pain. Patient had ED visit at [facility] at the end of Oct for c/o nausea/vomiting and diarrhea, labs were unremarkable except for mildly elevated Lipase, CT A/P w/contrast was also unremarkable.   Patient somewhat of a poor historian. Presents today with reports of lower belly pain for the past few years, no nausea or vomiting. Denies any upper abdominal or epigastric pain. Has been taking tums maybe once per week without relief. He endorses lower belly pain is sometimes improved with BM.  He has a BM everyday. Denies diarrhea. He reports that he has to strain to have a BM and feels that he is not emptying his bowels completely.  He denies issues finishing his meals or with his appetite. He has not had any significant weight loss. No blood in stools or black stools. He also feels as though he has a lot of bloating and flatulence that do not seem to be specific to foods he eats. He denies melena or rectal bleeding.   He reports dysphagia that occurs with pills and foods, a few times per week. He occasionally has to throw the food back up. Usually just keeps drinking sips of water and food/pills will typically pass. Reports food/pills feel as though they are getting stuck at the bottom of his throat. Has had no episodes of food impaction. History of mild esophageal dysmotility in the past. He takes omeprazole 45m daily and feels this works well  for him. He endorses a lot of belching but no significant episodes of acid reflux or acid regurgitation.   NSAID use: no Social hx: no etoh or tobacco Fam hLJ:QGBEEFhistory unknown  Last Colonoscopy:10/01/2005? Esophogram: 01/19/18: mild esophageal dysmotility, no mass, stricture or obstruction. EGD: n/a  Recommendations:  Due for screening colonoscopy  Past Medical History:  Diagnosis Date   Anxiety    Arthritis    Asthma    Bipolar disorder (HCC)    Chronic back pain    COPD (chronic obstructive pulmonary disease) (HCC)    Essential hypertension    Gastric ulcer    GERD (gastroesophageal reflux disease)    Head injury    History of kidney stones    Insomnia    Major depression    Mentally challenged    Migraine    Mixed hyperlipidemia    Morbid obesity ([place]    Neuropathy    Pre-diabetes    Pulmonary hamartoma ([place]    Seizures ([place]     10/27/20- last one age 60-14  Urinary urgency     Past Surgical History:  Procedure Laterality Date   ARTERY BIOPSY N/A 10/28/2020   Procedure: BIOPSY TEMPORAL ARTERY;  Surgeon: ERosetta Posner MD;  Location: [facility] OR;  Service: Vascular;  Laterality: N/A;   CYSTOSCOPY WITH RETROGRADE PYELOGRAM, URETEROSCOPY AND STENT PLACEMENT Left 11/30/2020   Procedure: CYSTOSCOPY WITH LEFT RETROGRADE PYELOGRAM, LEFT URETEROSCOPY AND LEFT STENT PLACEMENT;  Surgeon: MCleon Gustin MD;  Location: [facility];  Service: Urology;  Laterality: Left;   LITHOTRIPSY  2009   MULTIPLE EXTRACTIONS WITH ALVEOLOPLASTY N/A 12/17/2012   Procedure: MULTIPLE EXTRACTION WITH ALVEOLOPLASTY;  Surgeon: Gae Bon, DDS;  Location: [place];  Service: Oral Surgery;  Laterality: N/A;    Current Outpatient Medications  Medication Sig Dispense Refill   aspirin EC 81 MG tablet Take 81 mg by mouth daily.     EMGALITY 120 MG/ML SOAJ INJECT 120 MG into THE SKIN EVERY 28 DAYS 1 mL 0   finasteride (PROSCAR) 5 MG tablet Take 5 mg by mouth daily.     fluticasone (FLONASE) 50  MCG/ACT nasal spray INSTILL ONE SPRAY INTO BOTH NOSTRILS DAILY 16 g 11   furosemide (LASIX) 40 MG tablet Take 1 tablet (40 mg total) by mouth daily. 90 tablet 1   gabapentin (NEURONTIN) 100 MG capsule Take by mouth.     metFORMIN (GLUCOPHAGE) 500 MG tablet Take 500 mg by mouth daily.     metoprolol tartrate (LOPRESSOR) 50 MG tablet TAKE 1 TABLET BY MOUTH TWICE DAILY 60 tablet 6   omeprazole (PRILOSEC) 20 MG capsule Take 20 mg by mouth daily.     potassium chloride SA (KLOR-CON) 20 MEQ tablet Take 1 tablet (20 mEq total) by mouth daily. 90 tablet 1   tamsulosin (FLOMAX) 0.4 MG CAPS capsule Take 0.4 mg by mouth daily.     Vitamin D, Ergocalciferol, (DRISDOL) 1.25 MG (50000 UNIT) CAPS capsule Take 50,000 Units by mouth every 7 (seven) days.     ACCU-CHEK GUIDE test strip USE ONCE DAILY TO TEST SUGAR     Accu-Chek Softclix Lancets lancets USE TO TEST ONCE DAILY     acetaminophen (TYLENOL) 650 MG CR tablet Take 1,300 mg by mouth every 8 (eight) hours as needed for pain.     albuterol (VENTOLIN HFA) 108 (90 Base) MCG/ACT inhaler Inhale 1 puff into the lungs every 6 (six) hours as needed for wheezing or shortness of breath.     benztropine (COGENTIN) 1 MG tablet Take 1 mg by mouth daily.     Blood Glucose Monitoring Suppl (ACCU-CHEK GUIDE) w/Device KIT USE ONCE DAILY TO TEST SUAGR     Budeson-Glycopyrrol-Formoterol (BREZTRI AEROSPHERE) 160-9-4.8 MCG/ACT AERO Inhale 2 puffs into the lungs in the morning and at bedtime.     candesartan (ATACAND) 8 MG tablet Take 8 mg by mouth daily.     cetirizine (ZYRTEC) 10 MG tablet Take 10 mg by mouth daily.     doxazosin (CARDURA) 2 MG tablet TAKE 1 TABLET BY MOUTH DAILY 30 tablet 6   meclizine (ANTIVERT) 12.5 MG tablet Take 12.5 mg by mouth 3 (three) times daily as needed for dizziness.     Melatonin 10 MG TABS Take 20 mg by mouth at bedtime.     Multiple Vitamin (MULTIVITAMIN) tablet Take 2 tablets by mouth daily. Gummies     ondansetron (ZOFRAN ODT) 4 MG  disintegrating tablet Take 1 tablet (4 mg total) by mouth every 8 (eight) hours as needed for nausea or vomiting. 20 tablet 5   oxyCODONE-acetaminophen (PERCOCET) 5-325 MG tablet Take 1 tablet by mouth every 4 (four) hours as needed. 30 tablet 0   paliperidone (INVEGA) 3 MG 24 hr tablet Take 3 mg by mouth daily.     phenazopyridine (PYRIDIUM) 100 MG tablet Take 1 tablet (100 mg total) by mouth 3 (three) times daily as needed for pain. 15 tablet 0   sertraline (ZOLOFT) 100 MG tablet Take 100  mg by mouth at bedtime.     topiramate (TOPAMAX) 50 MG tablet Take 50 mg by mouth daily.     traZODone (DESYREL) 100 MG tablet Take 100 mg by mouth at bedtime.     No current facility-administered medications for this visit.    Allergies as of 06/21/2021 - Review Complete 06/21/2021  Allergen Reaction Noted   Bee venom Anaphylaxis 03/31/2011   Benadryl [diphenhydramine]  01/15/2018   Lorazepam Other (See Comments) 04/19/2011   Penicillins Other (See Comments) 03/31/2011   Latex Rash 10/19/2020    Family History  Adopted: Yes    Social History   Socioeconomic History   Marital status: Single    Spouse name: Not on file   Number of children: Not on file   Years of education: Not on file   Highest education level: Not on file  Occupational History   Not on file  Tobacco Use   Smoking status: Former    Packs/day: 2.00    Years: 27.00    Pack years: 54.00    Types: Cigarettes    Quit date: 08/01/2002    Years since quitting: 18.9    Passive exposure: Past   Smokeless tobacco: Never   Tobacco comments:    Patient states that he never was an everyday smoker only tried it when he was 38  Vaping Use   Vaping Use: Never used  Substance and Sexual Activity   Alcohol use: No   Drug use: No   Sexual activity: Never  Other Topics Concern   Not on file  Social History Narrative   Left handed   Social Determinants of Health   Financial Resource Strain: Not on file  Food Insecurity: Not on  file  Transportation Needs: Not on file  Physical Activity: Not on file  Stress: Not on file  Social Connections: Not on file   Review of systems General: negative for malaise, night sweats, fever, chills, weight loss Neck: Negative for lumps, goiter, pain and significant neck swelling Resp: Negative for cough, wheezing, dyspnea at rest CV: Negative for chest pain, leg swelling, palpitations, orthopnea GI: denies melena, hematochezia, nausea, vomiting, diarrhea, odyonophagia, early satiety or unintentional weight loss. +dysphagia +lower abdominal pain +constipation  MSK: Negative for joint pain or swelling, back pain, and muscle pain. Derm: Negative for itching or rash Psych: Denies depression, anxiety, memory loss, confusion. No homicidal or suicidal ideation.  Heme: Negative for prolonged bleeding, bruising easily, and swollen nodes. Endocrine: Negative for cold or heat intolerance, polyuria, polydipsia and goiter. Neuro: negative for tremor, gait imbalance, syncope and seizures. The remainder of the review of systems is noncontributory.  Physical Exam: There were no vitals taken for this visit. General:   Alert and oriented. No distress noted. Pleasant and cooperative.  Head:  Normocephalic and atraumatic. Eyes:  Conjuctiva clear without scleral icterus. Mouth:  Oral mucosa pink and moist. Good dentition. No lesions. Heart: Normal rate and rhythm, s1 and s2 heart sounds present.  Lungs: Clear lung sounds in all lobes. Respirations equal and unlabored. Abdomen:  +BS, full but soft, no tenderness to palpation. No rebound or guarding. No HSM or masses noted. Derm: No palmar erythema or jaundice Msk:  Symmetrical without gross deformities. Normal posture. Extremities:  Without edema. Neurologic:  Alert and  oriented x4 Psych:  Alert and cooperative. Normal mood and affect.  Invalid input(s): 6 MONTHS   ASSESSMENT: REMUS HAGEDORN is a 60 y.o. male presenting today with lower  abdominal pain and  dysphagia.   Patient reports generalized lower abdominal pain, sometimes improved with BM. He denies nausea, vomiting, diarrhea, rectal bleeding or melena. He endorses bloating. He has had no weight loss or changes in his appetite, his last colonoscopy was 2007? However, there are no results available for review of this. CT A/P w contrast done during ER visit in Oct without acute findings.  We will proceed with colonoscopy for further evaluation of his symptoms, though its likely he is experiencing some IBS given his lack of red flag symptoms.   He also endorses some dysphagia with foods and pills, no episodes of food impaction, usually can get food/pills to pass with drinking sips of liquids, though occasionally has to throw food back up as it will not pass. Hx of mild esophageal dysmotility on esophogram in 2019. He denies issues with reflux and is maintained on omeprazole 13m once daily. Denies early satiety. He does endorse belching frequently. Will proceed with EGD for further evaluation of dysphagia as we cannot r/o esophageal stricture.    PLAN:  Schedule EGD/Colonoscopy 2. Continue Omeprazole 465mdaily  3. Chewing precautions 4. Bentyl 1042mID PRN 5. Avoid trigger foods for GERD 6. Trial low FODMAP diet 7. Can try otc phazyme or IBgard for pain/bloating  No blood thinner, currently on metformin   Follow Up: F/u to be determined after procedures  Charles Parran L. CarAlver SorrowSN, APRN, AGNP-C Adult-Gerontology Nurse Practitioner [facility]

## 2021-06-21 NOTE — H&P (View-Only) (Signed)
Referring Provider: Bonnita Hollow, MD Primary Care Physician:  Bonnita Hollow, MD Primary GI Physician: new patient, previously Dr. Laural Golden  Chief Complaint  Patient presents with   Diverticulitis    Pt arrives with caregiver Charles Mathews. Pt having vomiting, nausea and diarrhea. Also has concerns of hiatial hernia.    HPI:   Charles Mathews is a 60 y.o. male with past medical history of anxiety, Bipolar, COPD, HTN, Gastric ulcer, GERD, depression, migraines, HLD, neuropathy.   Patient presenting today as a new patient with c/o dysphagia and abdominal pain. Patient had ED visit at [facility] at the end of Oct for c/o nausea/vomiting and diarrhea, labs were unremarkable except for mildly elevated Lipase, CT A/P w/contrast was also unremarkable.   Patient somewhat of a poor historian. Presents today with reports of lower belly pain for the past few years, no nausea or vomiting. Denies any upper abdominal or epigastric pain. Has been taking tums maybe once per week without relief. He endorses lower belly pain is sometimes improved with BM.  He has a BM everyday. Denies diarrhea. He reports that he has to strain to have a BM and feels that he is not emptying his bowels completely.  He denies issues finishing his meals or with his appetite. He has not had any significant weight loss. No blood in stools or black stools. He also feels as though he has a lot of bloating and flatulence that do not seem to be specific to foods he eats. He denies melena or rectal bleeding.   He reports dysphagia that occurs with pills and foods, a few times per week. He occasionally has to throw the food back up. Usually just keeps drinking sips of water and food/pills will typically pass. Reports food/pills feel as though they are getting stuck at the bottom of his throat. Has had no episodes of food impaction. History of mild esophageal dysmotility in the past. He takes omeprazole 33m daily and feels this works well  for him. He endorses a lot of belching but no significant episodes of acid reflux or acid regurgitation.   NSAID use: no Social hx: no etoh or tobacco Fam hOX:BDZHGDhistory unknown  Last Colonoscopy:10/01/2005? Esophogram: 01/19/18: mild esophageal dysmotility, no mass, stricture or obstruction. 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[facility]    Seizures ([facility]     10/27/20- last one age 60-14  Urinary urgency     Past Surgical History:  Procedure Laterality Date   ARTERY BIOPSY N/A 10/28/2020   Procedure: BIOPSY TEMPORAL ARTERY;  Surgeon: ERosetta Posner MD;  Location: [facility] OR;  Service: Vascular;  Laterality: N/A;   CYSTOSCOPY WITH RETROGRADE PYELOGRAM, URETEROSCOPY AND STENT PLACEMENT Left 11/30/2020   Procedure: CYSTOSCOPY WITH LEFT RETROGRADE PYELOGRAM, LEFT URETEROSCOPY AND LEFT STENT PLACEMENT;  Surgeon: MCleon Gustin MD;  Location: [facility];  Service: Urology;  Laterality: Left;   LITHOTRIPSY  2009   MULTIPLE EXTRACTIONS WITH ALVEOLOPLASTY N/A 12/17/2012   Procedure: MULTIPLE EXTRACTION WITH ALVEOLOPLASTY;  Surgeon: Gae Bon, DDS;  Location: [facility];  Service: Oral Surgery;  Laterality: N/A;    Current Outpatient Medications  Medication Sig Dispense Refill   aspirin EC 81 MG tablet Take 81 mg by mouth daily.     EMGALITY 120 MG/ML SOAJ INJECT 120 MG into THE SKIN EVERY 28 DAYS 1 mL 0   finasteride (PROSCAR) 5 MG tablet Take 5 mg by mouth daily.     fluticasone (FLONASE) 50  MCG/ACT nasal spray INSTILL ONE SPRAY INTO BOTH NOSTRILS DAILY 16 g 11   furosemide (LASIX) 40 MG tablet Take 1 tablet (40 mg total) by mouth daily. 90 tablet 1   gabapentin (NEURONTIN) 100 MG capsule Take by mouth.     metFORMIN (GLUCOPHAGE) 500 MG tablet Take 500 mg by mouth daily.     metoprolol tartrate (LOPRESSOR) 50 MG tablet TAKE 1 TABLET BY MOUTH TWICE DAILY 60 tablet 6   omeprazole (PRILOSEC) 20 MG capsule Take 20 mg by mouth daily.     potassium chloride SA (KLOR-CON) 20 MEQ tablet Take 1 tablet (20 mEq total) by mouth daily. 90 tablet 1   tamsulosin (FLOMAX) 0.4 MG CAPS capsule Take 0.4 mg by mouth daily.     Vitamin D, Ergocalciferol, (DRISDOL) 1.25 MG (50000 UNIT) CAPS capsule Take 50,000 Units by mouth every 7 (seven) days.     ACCU-CHEK GUIDE test strip USE ONCE DAILY TO TEST SUGAR     Accu-Chek Softclix Lancets lancets USE TO TEST ONCE DAILY     acetaminophen (TYLENOL) 650 MG CR tablet Take 1,300 mg by mouth every 8 (eight) hours as needed for pain.     albuterol (VENTOLIN HFA) 108 (90 Base) MCG/ACT inhaler Inhale 1 puff into the lungs every 6 (six) hours as needed for wheezing or shortness of breath.     benztropine (COGENTIN) 1 MG tablet Take 1 mg by mouth daily.     Blood Glucose Monitoring Suppl (ACCU-CHEK GUIDE) w/Device KIT USE ONCE DAILY TO TEST SUAGR     Budeson-Glycopyrrol-Formoterol (BREZTRI AEROSPHERE) 160-9-4.8 MCG/ACT AERO Inhale 2 puffs into the lungs in the morning and at bedtime.     candesartan (ATACAND) 8 MG tablet Take 8 mg by mouth daily.     cetirizine (ZYRTEC) 10 MG tablet Take 10 mg by mouth daily.     doxazosin (CARDURA) 2 MG tablet TAKE 1 TABLET BY MOUTH DAILY 30 tablet 6   meclizine (ANTIVERT) 12.5 MG tablet Take 12.5 mg by mouth 3 (three) times daily as needed for dizziness.     Melatonin 10 MG TABS Take 20 mg by mouth at bedtime.     Multiple Vitamin (MULTIVITAMIN) tablet Take 2 tablets by mouth daily. Gummies     ondansetron (ZOFRAN ODT) 4 MG  disintegrating tablet Take 1 tablet (4 mg total) by mouth every 8 (eight) hours as needed for nausea or vomiting. 20 tablet 5   oxyCODONE-acetaminophen (PERCOCET) 5-325 MG tablet Take 1 tablet by mouth every 4 (four) hours as needed. 30 tablet 0   paliperidone (INVEGA) 3 MG 24 hr tablet Take 3 mg by mouth daily.     phenazopyridine (PYRIDIUM) 100 MG tablet Take 1 tablet (100 mg total) by mouth 3 (three) times daily as needed for pain. 15 tablet 0   sertraline (ZOLOFT) 100 MG tablet Take 100  mg by mouth at bedtime.     topiramate (TOPAMAX) 50 MG tablet Take 50 mg by mouth daily.     traZODone (DESYREL) 100 MG tablet Take 100 mg by mouth at bedtime.     No current facility-administered medications for this visit.    Allergies as of 06/21/2021 - Review Complete 06/21/2021  Allergen Reaction Noted   Bee venom Anaphylaxis 03/31/2011   Benadryl [diphenhydramine]  01/15/2018   Lorazepam Other (See Comments) 04/19/2011   Penicillins Other (See Comments) 03/31/2011   Latex Rash 10/19/2020    Family History  Adopted: Yes    Social History   Socioeconomic History   Marital status: Single    Spouse name: Not on file   Number of children: Not on file   Years of education: Not on file   Highest education level: Not on file  Occupational History   Not on file  Tobacco Use   Smoking status: Former    Packs/day: 2.00    Years: 27.00    Pack years: 54.00    Types: Cigarettes    Quit date: 08/01/2002    Years since quitting: 18.9    Passive exposure: Past   Smokeless tobacco: Never   Tobacco comments:    Patient states that he never was an everyday smoker only tried it when he was 23  Vaping Use   Vaping Use: Never used  Substance and Sexual Activity   Alcohol use: No   Drug use: No   Sexual activity: Never  Other Topics Concern   Not on file  Social History Narrative   Left handed   Social Determinants of Health   Financial Resource Strain: Not on file  Food Insecurity: Not on  file  Transportation Needs: Not on file  Physical Activity: Not on file  Stress: Not on file  Social Connections: Not on file   Review of systems General: negative for malaise, night sweats, fever, chills, weight loss Neck: Negative for lumps, goiter, pain and significant neck swelling Resp: Negative for cough, wheezing, dyspnea at rest CV: Negative for chest pain, leg swelling, palpitations, orthopnea GI: denies melena, hematochezia, nausea, vomiting, diarrhea, odyonophagia, early satiety or unintentional weight loss. +dysphagia +lower abdominal pain +constipation  MSK: Negative for joint pain or swelling, back pain, and muscle pain. Derm: Negative for itching or rash Psych: Denies depression, anxiety, memory loss, confusion. No homicidal or suicidal ideation.  Heme: Negative for prolonged bleeding, bruising easily, and swollen nodes. Endocrine: Negative for cold or heat intolerance, polyuria, polydipsia and goiter. Neuro: negative for tremor, gait imbalance, syncope and seizures. The remainder of the review of systems is noncontributory.  Physical Exam: There were no vitals taken for this visit. General:   Alert and oriented. No distress noted. Pleasant and cooperative.  Head:  Normocephalic and atraumatic. Eyes:  Conjuctiva clear without scleral icterus. Mouth:  Oral mucosa pink and moist. Good dentition. No lesions. Heart: Normal rate and rhythm, s1 and s2 heart sounds present.  Lungs: Clear lung sounds in all lobes. Respirations equal and unlabored. Abdomen:  +BS, full but soft, no tenderness to palpation. No rebound or guarding. No HSM or masses noted. Derm: No palmar erythema or jaundice Msk:  Symmetrical without gross deformities. Normal posture. Extremities:  Without edema. Neurologic:  Alert and  oriented x4 Psych:  Alert and cooperative. Normal mood and affect.  Invalid input(s): 6 MONTHS   ASSESSMENT: KAULIN CHAVES is a 60 y.o. male presenting today with lower  abdominal pain and  dysphagia.   Patient reports generalized lower abdominal pain, sometimes improved with BM. He denies nausea, vomiting, diarrhea, rectal bleeding or melena. He endorses bloating. He has had no weight loss or changes in his appetite, his last colonoscopy was 2007? However, there are no results available for review of this. CT A/P w contrast done during ER visit in Oct without acute findings.  We will proceed with colonoscopy for further evaluation of his symptoms, though its likely he is experiencing some IBS given his lack of red flag symptoms.   He also endorses some dysphagia with foods and pills, no episodes of food impaction, usually can get food/pills to pass with drinking sips of liquids, though occasionally has to throw food back up as it will not pass. Hx of mild esophageal dysmotility on esophogram in 2019. He denies issues with reflux and is maintained on omeprazole 60m once daily. Denies early satiety. He does endorse belching frequently. Will proceed with EGD for further evaluation of dysphagia as we cannot r/o esophageal stricture.    PLAN:  Schedule EGD/Colonoscopy 2. Continue Omeprazole 428mdaily  3. Chewing precautions 4. Bentyl 1042mID PRN 5. Avoid trigger foods for GERD 6. Trial low FODMAP diet 7. Can try otc phazyme or IBgard for pain/bloating  No blood thinner, currently on metformin   Follow Up: F/u to be determined after procedures  Jawanza Zambito L. CarAlver SorrowSN, APRN, AGNP-C Adult-Gerontology Nurse Practitioner [facility]

## 2021-06-21 NOTE — Patient Instructions (Signed)
We will get you scheduled for EGD and colonoscopy for further evaluation of your symptoms.  I suspect you could be dealing with some irritable bowel syndrome causing your belly pain, gas and bloating. I am sending a prescription for bentyl 10mg , you can take this up to TID.  Please continue your omeprazole 40mg  daily, make sure you are taking this 30-45 minutes prior to breakfast. Stay upright 2-3 hours after eating, prior to lying down.  Continue taking small bites of food, chewing thoroughly and drinking sips of liquids between bites to avoid choking.  Avoid food such as chocolate, caffeine, spicy, greasy, heavy foods as these can worsen acid reflux. Be mindful that certain foods can also cause worsening gas/bloating. I have provided the low FODMAP diet as something you can try to see if this helps with your symptoms.

## 2021-06-22 ENCOUNTER — Telehealth (INDEPENDENT_AMBULATORY_CARE_PROVIDER_SITE_OTHER): Payer: Self-pay | Admitting: *Deleted

## 2021-06-22 ENCOUNTER — Telehealth (INDEPENDENT_AMBULATORY_CARE_PROVIDER_SITE_OTHER): Payer: Self-pay

## 2021-06-22 DIAGNOSIS — J449 Chronic obstructive pulmonary disease, unspecified: Secondary | ICD-10-CM | POA: Diagnosis not present

## 2021-06-22 DIAGNOSIS — J411 Mucopurulent chronic bronchitis: Secondary | ICD-10-CM | POA: Diagnosis not present

## 2021-06-22 MED ORDER — PEG 3350-KCL-NA BICARB-NACL 420 G PO SOLR: 4000.0000 mL | ORAL | 0 refills | Status: DC

## 2021-06-22 NOTE — Telephone Encounter (Signed)
Left on pt's vm that rx was sent in.

## 2021-06-22 NOTE — Telephone Encounter (Signed)
Charles Mathews, CMA  

## 2021-06-22 NOTE — Telephone Encounter (Signed)
Pt's CNA Myriam Jacobson called about the prep for colonoscopy. States [facility] did not receive it.   [phone]

## 2021-06-23 ENCOUNTER — Ambulatory Visit (INDEPENDENT_AMBULATORY_CARE_PROVIDER_SITE_OTHER): Payer: Medicare Other | Admitting: Gastroenterology

## 2021-06-28 ENCOUNTER — Other Ambulatory Visit: Payer: Self-pay

## 2021-06-28 ENCOUNTER — Ambulatory Visit: Payer: Medicare Other | Attending: Pulmonary Disease | Admitting: Pulmonary Disease

## 2021-06-28 DIAGNOSIS — Z6841 Body Mass Index (BMI) 40.0 and over, adult: Secondary | ICD-10-CM | POA: Diagnosis not present

## 2021-06-28 DIAGNOSIS — E662 Morbid (severe) obesity with alveolar hypoventilation: Secondary | ICD-10-CM | POA: Insufficient documentation

## 2021-06-28 DIAGNOSIS — J9611 Chronic respiratory failure with hypoxia: Secondary | ICD-10-CM | POA: Diagnosis not present

## 2021-06-28 DIAGNOSIS — G4733 Obstructive sleep apnea (adult) (pediatric): Secondary | ICD-10-CM | POA: Insufficient documentation

## 2021-06-30 ENCOUNTER — Telehealth: Payer: Self-pay | Admitting: Pulmonary Disease

## 2021-06-30 DIAGNOSIS — G4733 Obstructive sleep apnea (adult) (pediatric): Secondary | ICD-10-CM | POA: Diagnosis not present

## 2021-06-30 NOTE — Procedures (Signed)
     Patient Name: Charles Mathews, Charles Mathews Date: 06/28/2021 Gender: Male D.O.B: [birth year] Age (years): 66 Referring Provider: Chesley Mires MD, ABSM Height (inches): 69 Interpreting Physician: Chesley Mires MD, ABSM Weight (lbs): 328 RPSGT: Rosebud Poles BMI: 48 MRN: [id number] Neck Size: 20.00  CLINICAL INFORMATION The patient is referred for a CPAP titration to treat sleep apnea.  Date of HST 04/12/21:  AHI 53.3, SpO2 low 59%.  SLEEP STUDY TECHNIQUE As per the AASM Manual for the Scoring of Sleep and Associated Events v2.3 (April 2016) with a hypopnea requiring 4% desaturations.  The channels recorded and monitored were frontal, central and occipital EEG, electrooculogram (EOG), submentalis EMG (chin), nasal and oral airflow, thoracic and abdominal wall motion, anterior tibialis EMG, snore microphone, electrocardiogram, and pulse oximetry. Continuous positive airway pressure (CPAP) was initiated at the beginning of the study and titrated to treat sleep-disordered breathing.  MEDICATIONS Medications self-administered by patient taken the night of the study : N/A  TECHNICIAN COMMENTS Comments added by technician: CPAP therapy started at 6 cm of H2O per patients' request (couldn't feel the pressure). Titration increased to 11 cm of H2O due events in REM-SUPINE during later part of study. Patient tolerated CPAP very well. Optimal pressure obtained on CPAP therapy, without switching to BILEVEL therapy. The study started at 2 LPM of O2 supplement due to patient use of 3 LPM 24/7 at home Comments added by scorer: N/A  RESPIRATORY PARAMETERS Optimal PAP Pressure (cm): 11 AHI at Optimal Pressure (/hr): 4.3 Overall Minimal O2 (%): 80.00 Supine % at Optimal Pressure (%): 100 Minimal O2 at Optimal Pressure (%): 83.0   The study was conducted with him using 2 liters supplemental oxygen.  SLEEP ARCHITECTURE The study was initiated at 10:58:48 PM and ended at 5:47:17 AM.  Sleep onset time  was 14.8 minutes and the sleep efficiency was 84.8%. The total sleep time was 346.5 minutes.  The patient spent 3.90% of the night in stage N1 sleep, 62.34% in stage N2 sleep, 24.39% in stage N3 and 9.4% in REM.Stage REM latency was 249.5 minutes  Wake after sleep onset was 47.2. Alpha intrusion was absent. Supine sleep was 23.25%.  CARDIAC DATA The 2 lead EKG demonstrated sinus rhythm. The mean heart rate was 49.96 beats per minute. Other EKG findings include: None.  LEG MOVEMENT DATA The total Periodic Limb Movements of Sleep (PLMS) were 156. The PLMS index was 27.01. A PLMS index of <15 is considered normal in adults.  IMPRESSIONS - He did well with CPAP 11 cm H2O and 2 liters supplemental oxygen.  DIAGNOSIS - Obstructive Sleep Apnea (G47.33)  RECOMMENDATIONS - Trial of CPAP therapy on 11 cm H2O with 2 liters supplemental oxygen. - He as fitted with a Medium size Resmed Full Face Mask AirFit F20 mask and heated humidification. - Avoid alcohol, sedatives and other CNS depressants that may worsen sleep apnea and disrupt normal sleep architecture. - Sleep hygiene should be reviewed to assess factors that may improve sleep quality. - Weight management and regular exercise should be initiated or continued.  [Electronically signed] 06/30/2021 09:40 AM  Chesley Mires MD, ABSM Diplomate, American Board of Sleep Medicine   NPI: [id number] [place] PH: [phone]   FX: [phone] [place]

## 2021-06-30 NOTE — Patient Instructions (Signed)
Charles Mathews  06/30/2021     @PREFPERIOPPHARMACY @   Your procedure is scheduled on 07/06/2021.   Report to [facility] at  [place].   Call this number if you have problems the morning of surgery:  [phone]   Remember:  Follow the diet and prep instructions given to you by the office.       Use your inhaler before you come and bring your rescue inhaler with you.    Take these medicines the morning of surgery with A SIP OF WATER         cogentin, zyrtec, cardura, antivert, metoprolol, prilosec, zofran (if needed), invega, flomax, topamax, tramadol(if needed).     Do not wear jewelry, make-up or nail polish.  Do not wear lotions, powders, or perfumes, or deodorant.  Do not shave 48 hours prior to surgery.  Men may shave face and neck.  Do not bring valuables to the hospital.  [facility] is not responsible for any belongings or valuables.  Contacts, dentures or bridgework may not be worn into surgery.  Leave your suitcase in the car.  After surgery it may be brought to your room.  For patients admitted to the hospital, discharge time will be determined by your treatment team.  Patients discharged the day of surgery will not be allowed to drive home and must have someone with them for 24 hours.    Special instructions:   DO NOT smoke tobacco or vape for 24 hours before your procedure.  Please read over the following fact sheets that you were given. Anesthesia Post-op Instructions and Care and Recovery After Surgery      Colonoscopy, Adult, Care After This sheet gives you information about how to care for yourself after your procedure. Your health care provider may also give you more specific instructions. If you have problems or questions, contact your health care provider. What can I expect after the procedure? After the procedure, it is common to have: A small amount of blood in your stool for 24 hours after the procedure. Some gas. Mild cramping  or bloating of your abdomen. Follow these instructions at home: Eating and drinking  Drink enough fluid to keep your urine pale yellow. Follow instructions from your health care provider about eating or drinking restrictions. Resume your normal diet as instructed by your health care provider. Avoid heavy or fried foods that are hard to digest. Activity Rest as told by your health care provider. Avoid sitting for a long time without moving. Get up to take short walks every 1-2 hours. This is important to improve blood flow and breathing. Ask for help if you feel weak or unsteady. Return to your normal activities as told by your health care provider. Ask your health care provider what activities are safe for you. Managing cramping and bloating  Try walking around when you have cramps or feel bloated. Apply heat to your abdomen as told by your health care provider. Use the heat source that your health care provider recommends, such as a moist heat pack or a heating pad. Place a towel between your skin and the heat source. Leave the heat on for 20-30 minutes. Remove the heat if your skin turns bright red. This is especially important if you are unable to feel pain, heat, or cold. You may have a greater risk of getting burned. General instructions If you were given a sedative during the procedure, it can affect you for  several hours. Do not drive or operate machinery until your health care provider says that it is safe. For the first 24 hours after the procedure: Do not sign important documents. Do not drink alcohol. Do your regular daily activities at a slower pace than normal. Eat soft foods that are easy to digest. Take over-the-counter and prescription medicines only as told by your health care provider. Keep all follow-up visits as told by your health care provider. This is important. Contact a health care provider if: You have blood in your stool 2-3 days after the procedure. Get help  right away if you have: More than a small spotting of blood in your stool. Large blood clots in your stool. Swelling of your abdomen. Nausea or vomiting. A fever. Increasing pain in your abdomen that is not relieved with medicine. Summary After the procedure, it is common to have a small amount of blood in your stool. You may also have mild cramping and bloating of your abdomen. If you were given a sedative during the procedure, it can affect you for several hours. Do not drive or operate machinery until your health care provider says that it is safe. Get help right away if you have a lot of blood in your stool, nausea or vomiting, a fever, or increased pain in your abdomen. This information is not intended to replace advice given to you by your health care provider. Make sure you discuss any questions you have with your health care provider. Document Revised: 05/24/2019 Document Reviewed: 02/11/2019 [name] After This sheet gives you information about how to care for yourself after your procedure. Your health care provider may also give you more specific instructions. If you have problems or questions, contact your health care provider. What can I expect after the procedure? After the procedure, it is common to have: Tiredness. Forgetfulness about what happened after the procedure. Impaired judgment for important decisions. Nausea or vomiting. Some difficulty with balance. Follow these instructions at home: For the time period you were told by your health care provider:   Rest as needed. Do not participate in activities where you could fall or become injured. Do not drive or use machinery. Do not drink alcohol. Do not take sleeping pills or medicines that cause drowsiness. Do not make important decisions or sign legal documents. Do not take care of children on your own. Eating and drinking Follow the diet that  is recommended by your health care provider. Drink enough fluid to keep your urine pale yellow. If you vomit: Drink water, juice, or soup when you can drink without vomiting. Make sure you have little or no nausea before eating solid foods. General instructions Have a responsible adult stay with you for the time you are told. It is important to have someone help care for you until you are awake and alert. Take over-the-counter and prescription medicines only as told by your health care provider. If you have sleep apnea, surgery and certain medicines can increase your risk for breathing problems. Follow instructions from your health care provider about wearing your sleep device: Anytime you are sleeping, including during daytime naps. While taking prescription pain medicines, sleeping medicines, or medicines that make you drowsy. Avoid smoking. Keep all follow-up visits as told by your health care provider. This is important. Contact a health care provider if: You keep feeling nauseous or you keep vomiting. You feel light-headed. You are still sleepy or having trouble  with balance after 24 hours. You develop a rash. You have a fever. You have redness or swelling around the IV site. Get help right away if: You have trouble breathing. You have new-onset confusion at home. Summary For several hours after your procedure, you may feel tired. You may also be forgetful and have poor judgment. Have a responsible adult stay with you for the time you are told. It is important to have someone help care for you until you are awake and alert. Rest as told. Do not drive or operate machinery. Do not drink alcohol or take sleeping pills. Get help right away if you have trouble breathing, or if you suddenly become confused. This information is not intended to replace advice given to you by your health care provider. Make sure you discuss any questions you have with your health care provider. Document  Revised: 04/02/2020 Document Reviewed: 06/20/2019 Elsevier Patient Education  2022 [employer].

## 2021-06-30 NOTE — Telephone Encounter (Signed)
CPAP titration 06/28/21 >> CPAP 11 cm H2O with 2 liters O2 >> AHI 4.3.   Please let him know he did well during sleep study with CPAP 11 cm H2O and 2 liters oxygen.    Please send order to [facility] to arrange for CPAP 11 cm H2O with heated humidity and mask of choice, and his CPAP needs to have 2 liters oxygen bleed in.  Please arrange for follow up in [place] office with me in 3 to 4 months.

## 2021-07-01 ENCOUNTER — Other Ambulatory Visit: Payer: Self-pay

## 2021-07-01 ENCOUNTER — Encounter (HOSPITAL_COMMUNITY): Payer: Self-pay

## 2021-07-01 ENCOUNTER — Encounter (HOSPITAL_COMMUNITY)
Admission: RE | Admit: 2021-07-01 | Discharge: 2021-07-01 | Disposition: A | Discharge status: Home / Self Care | Payer: Medicare Other | Source: Ambulatory Visit | Attending: Gastroenterology | Admitting: Gastroenterology

## 2021-07-01 DIAGNOSIS — R1030 Lower abdominal pain, unspecified: Secondary | ICD-10-CM | POA: Insufficient documentation

## 2021-07-01 DIAGNOSIS — Z01812 Encounter for preprocedural laboratory examination: Secondary | ICD-10-CM | POA: Insufficient documentation

## 2021-07-01 DIAGNOSIS — R1310 Dysphagia, unspecified: Secondary | ICD-10-CM | POA: Insufficient documentation

## 2021-07-01 HISTORY — DX: Sleep apnea, unspecified: G47.30

## 2021-07-01 LAB — BASIC METABOLIC PANEL
Anion gap: 7 (ref 5–15)
BUN: 27 mg/dL — ABNORMAL HIGH (ref 6–20)
CO2: 26 mmol/L (ref 22–32)
Calcium: 8.8 mg/dL — ABNORMAL LOW (ref 8.9–10.3)
Chloride: 108 mmol/L (ref 98–111)
Creatinine, Ser: 1.19 mg/dL (ref 0.61–1.24)
GFR, Estimated: >60 mL/min (ref >60)
Glucose, Bld: 115 mg/dL — ABNORMAL HIGH (ref 70–99)
Potassium: 4.4 mmol/L (ref 3.5–5.1)
Sodium: 141 mmol/L (ref 135–145)

## 2021-07-02 NOTE — Telephone Encounter (Signed)
Called and spoke with Myriam Jacobson per DPR to let her know the results of the sleep study and also let her know that order has been placed for him to be set up with cpap with the settings recommended and to have 2 liters bled in. They expressed understanding. Order has been sent and recall placed for follow up. Nothing further needed at this time.

## 2021-07-06 ENCOUNTER — Ambulatory Visit (HOSPITAL_COMMUNITY): Payer: Medicare Other | Admitting: Anesthesiology

## 2021-07-06 ENCOUNTER — Encounter (HOSPITAL_COMMUNITY)
Admission: RE | Disposition: A | Discharge status: Home / Self Care | Payer: Self-pay | Source: Home / Self Care | Attending: Gastroenterology

## 2021-07-06 ENCOUNTER — Ambulatory Visit (HOSPITAL_COMMUNITY)
Admission: RE | Admit: 2021-07-06 | Discharge: 2021-07-06 | Disposition: A | Discharge status: Home / Self Care | Payer: Medicare Other | Attending: Gastroenterology | Admitting: Gastroenterology

## 2021-07-06 ENCOUNTER — Encounter (HOSPITAL_COMMUNITY): Payer: Self-pay | Admitting: Gastroenterology

## 2021-07-06 DIAGNOSIS — M1990 Unspecified osteoarthritis, unspecified site: Secondary | ICD-10-CM | POA: Diagnosis not present

## 2021-07-06 DIAGNOSIS — I10 Essential (primary) hypertension: Secondary | ICD-10-CM | POA: Diagnosis not present

## 2021-07-06 DIAGNOSIS — G4730 Sleep apnea, unspecified: Secondary | ICD-10-CM | POA: Insufficient documentation

## 2021-07-06 DIAGNOSIS — F319 Bipolar disorder, unspecified: Secondary | ICD-10-CM | POA: Diagnosis not present

## 2021-07-06 DIAGNOSIS — F419 Anxiety disorder, unspecified: Secondary | ICD-10-CM | POA: Diagnosis not present

## 2021-07-06 DIAGNOSIS — D124 Benign neoplasm of descending colon: Secondary | ICD-10-CM | POA: Insufficient documentation

## 2021-07-06 DIAGNOSIS — Z6841 Body Mass Index (BMI) 40.0 and over, adult: Secondary | ICD-10-CM | POA: Diagnosis not present

## 2021-07-06 DIAGNOSIS — R1030 Lower abdominal pain, unspecified: Secondary | ICD-10-CM

## 2021-07-06 DIAGNOSIS — R109 Unspecified abdominal pain: Secondary | ICD-10-CM | POA: Diagnosis not present

## 2021-07-06 DIAGNOSIS — K2970 Gastritis, unspecified, without bleeding: Secondary | ICD-10-CM | POA: Diagnosis not present

## 2021-07-06 DIAGNOSIS — N289 Disorder of kidney and ureter, unspecified: Secondary | ICD-10-CM | POA: Insufficient documentation

## 2021-07-06 DIAGNOSIS — K648 Other hemorrhoids: Secondary | ICD-10-CM | POA: Insufficient documentation

## 2021-07-06 DIAGNOSIS — R519 Headache, unspecified: Secondary | ICD-10-CM | POA: Insufficient documentation

## 2021-07-06 DIAGNOSIS — D123 Benign neoplasm of transverse colon: Secondary | ICD-10-CM | POA: Diagnosis not present

## 2021-07-06 DIAGNOSIS — R569 Unspecified convulsions: Secondary | ICD-10-CM | POA: Diagnosis not present

## 2021-07-06 DIAGNOSIS — K2289 Other specified disease of esophagus: Secondary | ICD-10-CM | POA: Insufficient documentation

## 2021-07-06 DIAGNOSIS — K635 Polyp of colon: Secondary | ICD-10-CM | POA: Diagnosis not present

## 2021-07-06 DIAGNOSIS — Z8711 Personal history of peptic ulcer disease: Secondary | ICD-10-CM | POA: Diagnosis not present

## 2021-07-06 DIAGNOSIS — J449 Chronic obstructive pulmonary disease, unspecified: Secondary | ICD-10-CM | POA: Insufficient documentation

## 2021-07-06 DIAGNOSIS — Z87891 Personal history of nicotine dependence: Secondary | ICD-10-CM | POA: Diagnosis not present

## 2021-07-06 DIAGNOSIS — K219 Gastro-esophageal reflux disease without esophagitis: Secondary | ICD-10-CM | POA: Diagnosis not present

## 2021-07-06 DIAGNOSIS — D122 Benign neoplasm of ascending colon: Secondary | ICD-10-CM | POA: Diagnosis not present

## 2021-07-06 DIAGNOSIS — R1310 Dysphagia, unspecified: Secondary | ICD-10-CM

## 2021-07-06 DIAGNOSIS — K2270 Barrett's esophagus without dysplasia: Secondary | ICD-10-CM | POA: Diagnosis not present

## 2021-07-06 HISTORY — PX: ESOPHAGOGASTRODUODENOSCOPY (EGD) WITH PROPOFOL: SHX5813

## 2021-07-06 HISTORY — PX: COLONOSCOPY WITH PROPOFOL: SHX5780

## 2021-07-06 HISTORY — PX: SAVORY DILATION: SHX5439

## 2021-07-06 HISTORY — PX: POLYPECTOMY: SHX5525

## 2021-07-06 HISTORY — PX: BIOPSY: SHX5522

## 2021-07-06 LAB — HM COLONOSCOPY

## 2021-07-06 LAB — GLUCOSE, CAPILLARY: Glucose-Capillary: 129 mg/dL — ABNORMAL HIGH (ref 70–99)

## 2021-07-06 SURGERY — COLONOSCOPY WITH PROPOFOL
Anesthesia: General

## 2021-07-06 MED ORDER — IPRATROPIUM-ALBUTEROL 0.5-2.5 (3) MG/3ML IN SOLN: 3.0000 mL | Freq: Once | RESPIRATORY_TRACT | Status: AC

## 2021-07-06 MED ORDER — PROPOFOL 10 MG/ML IV BOLUS
INTRAVENOUS | Status: AC
  Filled 2021-07-06: qty 20

## 2021-07-06 MED ORDER — PROPOFOL 10 MG/ML IV BOLUS
INTRAVENOUS | Status: DC | PRN
  Administered 2021-07-06: 100 mg via INTRAVENOUS

## 2021-07-06 MED ORDER — LACTATED RINGERS IV SOLN
INTRAVENOUS | Status: DC | PRN
Start: 2021-07-06 — End: 2021-07-06

## 2021-07-06 MED ORDER — PROPOFOL 500 MG/50ML IV EMUL
INTRAVENOUS | Status: DC | PRN
Start: 2021-07-06 — End: 2021-07-06
  Administered 2021-07-06: 150 ug/kg/min via INTRAVENOUS

## 2021-07-06 MED ORDER — IPRATROPIUM-ALBUTEROL 0.5-2.5 (3) MG/3ML IN SOLN
RESPIRATORY_TRACT | Status: AC
  Administered 2021-07-06: 3 mL via RESPIRATORY_TRACT
  Filled 2021-07-06: qty 3

## 2021-07-06 MED ORDER — OMEPRAZOLE 20 MG PO CPDR: 20.0000 mg | DELAYED_RELEASE_CAPSULE | Freq: Two times a day (BID) | ORAL | 0 refills | Status: DC

## 2021-07-06 NOTE — Transfer of Care (Signed)
Immediate Anesthesia Transfer of Care Note  Patient: DEYON CHIZEK  Procedure(s) Performed: COLONOSCOPY WITH PROPOFOL ESOPHAGOGASTRODUODENOSCOPY (EGD) WITH PROPOFOL BIOPSY SAVORY DILATION POLYPECTOMY  Patient Location: PACU  Anesthesia Type:General  Level of Consciousness: awake, alert , oriented and patient cooperative  Airway & Oxygen Therapy: Patient Spontanous Breathing and Patient connected to nasal cannula oxygen  Post-op Assessment: Report given to RN, Post -op Vital signs reviewed and stable and Patient moving all extremities X 4  Post vital signs: Reviewed and stable  Last Vitals:  Vitals Value Taken Time  BP    Temp    Pulse 63 07/06/21 1148  Resp 18 07/06/21 1148  SpO2 94 % 07/06/21 1148  Vitals shown include unvalidated device data.  Last Pain:  Vitals:   07/06/21 1034  TempSrc:   PainSc: 9          Complications: No notable events documented.

## 2021-07-06 NOTE — Anesthesia Postprocedure Evaluation (Signed)
Anesthesia Post Note  Patient: Charles Mathews  Procedure(s) Performed: COLONOSCOPY WITH PROPOFOL ESOPHAGOGASTRODUODENOSCOPY (EGD) WITH PROPOFOL BIOPSY SAVORY DILATION POLYPECTOMY  Patient location during evaluation: PACU Anesthesia Type: General Level of consciousness: awake and alert and oriented Pain management: pain level controlled Vital Signs Assessment: post-procedure vital signs reviewed and stable Respiratory status: spontaneous breathing, nonlabored ventilation and respiratory function stable Cardiovascular status: blood pressure returned to baseline and stable Postop Assessment: no apparent nausea or vomiting Anesthetic complications: no   No notable events documented.   Last Vitals:  Vitals:   07/06/21 1156 07/06/21 1203  BP: 122/79 117/60  Pulse: (!) 57 (!) 59  Resp: 20 20  Temp:  (!) 36.1 C  SpO2: 97% 98%    Last Pain:  Vitals:   07/06/21 1203  TempSrc: Oral  PainSc: 9                  Jerlean Peralta C Shona Pardo

## 2021-07-06 NOTE — Op Note (Addendum)
[facility] Patient Name: Charles Mathews Procedure Date: 07/06/2021 10:24 AM MRN: [id number] Date of Birth: March 29, 1961 Attending MD: [name] ,  CSN: [id number] Age: 60 Admit Type: Outpatient Procedure:                Upper GI endoscopy Indications:              Abdominal pain, Dysphagia Providers:                [name], Janeece Riggers, RN, Kristine L.                            Risa Grill, Technician Referring MD:              Medicines:                Monitored Anesthesia Care Complications:            No immediate complications. Estimated Blood Loss:     Estimated blood loss: none. Procedure:                Pre-Anesthesia Assessment:                           - Prior to the procedure, a History and Physical                            was performed, and patient medications, allergies                            and sensitivities were reviewed. The patient's                            tolerance of previous anesthesia was reviewed.                           - The risks and benefits of the procedure and the                            sedation options and risks were discussed with the                            patient. All questions were answered and informed                            consent was obtained.                           - ASA Grade Assessment: III - A patient with severe                            systemic disease.                           After obtaining informed consent, the endoscope was                            passed under direct vision. Throughout the  procedure, the patient's blood pressure, pulse, and                            oxygen saturations were monitored continuously. The                            [id number]-H190 ([id number]) scope was introduced through the                            mouth, and advanced to the second part of duodenum.                            The upper GI endoscopy was accomplished without                             difficulty. The patient tolerated the procedure                            well. Scope In: 10:40:56 AM Scope Out: 11:03:19 AM Total Procedure Duration: 0 hours 22 minutes 23 seconds  Findings:      No endoscopic abnormality was evident in the esophagus to explain the       patient's complaint of dysphagia. It was decided, however, to proceed       with dilation of the entire esophagus. A guidewire was placed and the       scope was withdrawn. Dilation was performed with a Savary dilator with       no resistance at 18 mm. No mucosal disruption observed.      The esophagus and gastroesophageal junction were examined with white       light and narrow band imaging (NBI). There were esophageal mucosal       changes suspicious for short-segment Barrett's esophagus. These changes       involved the mucosa at the upper extent of the gastric folds (40 cm from       the incisors) extending to the Z-line (37 cm from the incisors). Islands       of salmon-colored mucosa were present from 37 to 40 cm. The maximum       longitudinal extent of these esophageal mucosal changes was 3 cm in       length. This was biopsied with a cold forceps for histology every 1 cm       in 4 different bottles per [facility] protocol. No nodularities were       observed.      Segmental moderate inflammation characterized by congestion (edema) and       erythema was found in the gastric antrum. Biopsies were taken with a       cold forceps for Helicobacter pylori testing.      The examined duodenum was normal. Biopsies were taken with a cold       forceps for histology. Impression:               - No endoscopic esophageal abnormality to explain                            patient's dysphagia. Esophagus dilated.                           -  Esophageal mucosal changes suspicious for                            short-segment Barrett's esophagus. Biopsied.                           - Gastritis. Biopsied.                            - Normal examined duodenum. Biopsied. Moderate Sedation:      Per Anesthesia Care Recommendation:           - Discharge patient to home (ambulatory).                           - Resume previous diet.                           - Await pathology results.                           - Use Prilosec (omeprazole) 20 mg PO BID for 3                            months, then decrease to once a day.                           - Await pathology results.                           - Check H. pylori serology.                           - If persistent dysphagia may consider esophageal                            manometry. Procedure Code(s):        --- Professional ---                           [phone], Esophagogastroduodenoscopy, flexible,                            transoral; with insertion of guide wire followed by                            passage of dilator(s) through esophagus over guide                            wire                           52841, 47, Esophagogastroduodenoscopy, flexible,                            transoral; with biopsy, single or multiple Diagnosis Code(s):        --- Professional ---  R13.10, Dysphagia, unspecified                           K22.8, Other specified diseases of esophagus                           K29.70, Gastritis, unspecified, without bleeding                           R10.9, Unspecified abdominal pain CPT copyright 2019 American [employer]. All rights reserved. The codes documented in this report are preliminary and upon coder review may  be revised to meet current compliance requirements. [name], MD [name],  07/06/2021 12:02:45 PM This report has been signed electronically. Number of Addenda: 0

## 2021-07-06 NOTE — Interval H&P Note (Signed)
History and Physical Interval Note:  07/06/2021 9:14 AM  Patient is a poor historian but reports having pain in the right flank with some episode of dysphagia.  Does not provide any other details regarding his symptoms.  GENERAL: The patient is AO x3, in no acute distress. Obese. HEENT: Head is normocephalic and atraumatic. EOMI are intact. Mouth is well hydrated and without lesions. NECK: Supple. No masses LUNGS: Clear to auscultation. No presence of rhonchi/wheezing/rales. Adequate chest expansion HEART: RRR, normal s1 and s2. ABDOMEN: Soft, nontender, no guarding, no peritoneal signs, and nondistended. BS +. No masses. EXTREMITIES: Without any cyanosis, clubbing, rash, lesions or edema. NEUROLOGIC: AOx3, no focal motor deficit. SKIN: no jaundice, no rashes   Charles Mathews  has presented today for surgery, with the diagnosis of Lower Abdominal Pain,dysphagia.  The various methods of treatment have been discussed with the patient and family. After consideration of risks, benefits and other options for treatment, the patient has consented to  Procedure(s) with comments: COLONOSCOPY WITH PROPOFOL (N/A) - 10:15 ESOPHAGOGASTRODUODENOSCOPY (EGD) WITH PROPOFOL (N/A) as a surgical intervention.  The patient's history has been reviewed, patient examined, no change in status, stable for surgery.  I have reviewed the patient's chart and labs.  Questions were answered to the patient's satisfaction.     [name]

## 2021-07-06 NOTE — Discharge Instructions (Addendum)
You are being discharged to home.  Resume your previous diet.  We are waiting for your pathology results.  Take Prilosec (omeprazole) 20 mg by mouth twice a day for three months, then decrease to once a day.   We are waiting for your pathology results.  Your physician has recommended a repeat colonoscopy for surveillance based on pathology results.

## 2021-07-06 NOTE — Anesthesia Preprocedure Evaluation (Signed)
Anesthesia Evaluation  Patient identified by MRN, date of birth, ID band Patient awake    Reviewed: Allergy & Precautions, NPO status , Patient's Chart, lab work & pertinent test results, reviewed documented beta blocker date and time   History of Anesthesia Complications Negative for: history of anesthetic complications  Airway Mallampati: III  TM Distance: >3 FB Neck ROM: Full    Dental  (+) Edentulous Upper, Edentulous Lower   Pulmonary shortness of breath and with exertion, asthma , sleep apnea and Oxygen sleep apnea , COPD, former smoker,    Pulmonary exam normal breath sounds clear to auscultation       Cardiovascular Exercise Tolerance: Poor hypertension, Pt. on home beta blockers and Pt. on medications Normal cardiovascular exam Rhythm:Regular Rate:Normal     Neuro/Psych  Headaches, Seizures - (last seizure 2 weeks ago), Well Controlled,  PSYCHIATRIC DISORDERS Anxiety Depression Bipolar Disorder    GI/Hepatic PUD, GERD  Medicated,  Endo/Other  Morbid obesity  Renal/GU Renal InsufficiencyRenal disease     Musculoskeletal  (+) Arthritis , Osteoarthritis,    Abdominal   Peds  Hematology negative hematology ROS (+)   Anesthesia Other Findings Room air oxygen saturations 88-91%  Reproductive/Obstetrics                            Anesthesia Physical Anesthesia Plan  ASA: 4  Anesthesia Plan: General   Post-op Pain Management: Minimal or no pain anticipated   Induction: Intravenous  PONV Risk Score and Plan: TIVA  Airway Management Planned: Nasal Cannula and Natural Airway  Additional Equipment:   Intra-op Plan:   Post-operative Plan:   Informed Consent: I have reviewed the patients History and Physical, chart, labs and discussed the procedure including the risks, benefits and alternatives for the proposed anesthesia with the patient or authorized representative who has  indicated his/her understanding and acceptance.     Dental advisory given  Plan Discussed with: CRNA and Surgeon  Anesthesia Plan Comments:        Anesthesia Quick Evaluation

## 2021-07-06 NOTE — Op Note (Addendum)
[facility] Patient Name: Charles Mathews Procedure Date: 07/06/2021 11:08 AM MRN: [id number] Date of Birth: 03-Jan-1961 Attending MD: [name] ,  CSN: [id number] Age: 60 Admit Type: Outpatient Procedure:                Colonoscopy Indications:              Abdominal pain Providers:                [name], Janeece Riggers, RN, Kristine L.                            Risa Grill, Technician Referring MD:              Medicines:                Monitored Anesthesia Care Complications:            No immediate complications. Estimated Blood Loss:     Estimated blood loss: none. Procedure:                Pre-Anesthesia Assessment:                           - Prior to the procedure, a History and Physical                            was performed, and patient medications, allergies                            and sensitivities were reviewed. The patient's                            tolerance of previous anesthesia was reviewed.                           - The risks and benefits of the procedure and the                            sedation options and risks were discussed with the                            patient. All questions were answered and informed                            consent was obtained.                           - ASA Grade Assessment: III - A patient with severe                            systemic disease.                           After obtaining informed consent, the colonoscope                            was passed under direct vision. Throughout the  procedure, the patient's blood pressure, pulse, and                            oxygen saturations were monitored continuously. The                            [id number]-HQ190L ([id number]) scope was introduced through                            the anus and advanced to the the cecum, identified                            by appendiceal orifice and ileocecal valve. The                             colonoscopy was performed without difficulty. The                            patient tolerated the procedure well. The quality                            of the bowel preparation was adequate. Scope In: 11:10:33 AM Scope Out: 11:39:45 AM Scope Withdrawal Time: 0 hours 19 minutes 17 seconds  Total Procedure Duration: 0 hours 29 minutes 12 seconds  Findings:      The perianal and digital rectal examinations were normal.      Six sessile polyps were found in the descending colon, splenic flexure       and ascending colon. The polyps were 3 to 7 mm in size. These polyps       were removed with a cold snare. Resection and retrieval were complete.      A 2 mm polyp was found in the transverse colon. The polyp was sessile.       The polyp was removed with a cold biopsy forceps. Resection and       retrieval were complete.      Non-bleeding internal hemorrhoids were found during retroflexion. The       hemorrhoids were moderate. Impression:               - Six 3 to 7 mm polyps in the descending colon, at                            the splenic flexure and in the ascending colon,                            removed with a cold snare. Resected and retrieved.                           - One 2 mm polyp in the transverse colon, removed                            with a cold biopsy forceps. Resected and retrieved.                           -  Non-bleeding internal hemorrhoids. Moderate Sedation:      Per Anesthesia Care Recommendation:           - Discharge patient to home (ambulatory).                           - Resume previous diet.                           - Await pathology results.                           - Repeat colonoscopy for surveillance based on                            pathology results. Procedure Code(s):        --- Professional ---                           [id number], Colonoscopy, flexible; with removal of                            tumor(s), polyp(s), or other lesion(s) by snare                             technique                           45380, 40, Colonoscopy, flexible; with biopsy,                            single or multiple Diagnosis Code(s):        --- Professional ---                           K63.5, Polyp of colon                           K64.8, Other hemorrhoids                           R10.9, Unspecified abdominal pain CPT copyright 2019 American Medical Association. All rights reserved. The codes documented in this report are preliminary and upon coder review may  be revised to meet current compliance requirements. [name], MD [name],  07/06/2021 12:01:15 PM This report has been signed electronically. Number of Addenda: 0

## 2021-07-08 LAB — SURGICAL PATHOLOGY

## 2021-07-08 LAB — H. PYLORI ANTIBODY, IGG: H Pylori IgG: 0.29 Index Value (ref 0.00–0.79)

## 2021-07-09 ENCOUNTER — Encounter (HOSPITAL_COMMUNITY): Payer: Self-pay | Admitting: Gastroenterology

## 2021-07-09 ENCOUNTER — Encounter (INDEPENDENT_AMBULATORY_CARE_PROVIDER_SITE_OTHER): Payer: Self-pay | Admitting: *Deleted

## 2021-07-18 ENCOUNTER — Other Ambulatory Visit: Payer: Self-pay | Admitting: Neurology

## 2021-07-18 ENCOUNTER — Other Ambulatory Visit: Payer: Self-pay | Admitting: Physician Assistant

## 2021-07-19 NOTE — Telephone Encounter (Signed)
Pt aide advised to call the office to schedule a visit to get further refills.

## 2021-07-22 DIAGNOSIS — J449 Chronic obstructive pulmonary disease, unspecified: Secondary | ICD-10-CM | POA: Diagnosis not present

## 2021-07-22 DIAGNOSIS — J411 Mucopurulent chronic bronchitis: Secondary | ICD-10-CM | POA: Diagnosis not present

## 2021-07-28 ENCOUNTER — Other Ambulatory Visit: Payer: Self-pay

## 2021-07-28 ENCOUNTER — Ambulatory Visit (HOSPITAL_COMMUNITY)
Admission: RE | Admit: 2021-07-28 | Discharge: 2021-07-28 | Disposition: A | Discharge status: Home / Self Care | Payer: Medicare Other | Source: Ambulatory Visit | Attending: Urology | Admitting: Urology

## 2021-07-28 DIAGNOSIS — N200 Calculus of kidney: Secondary | ICD-10-CM | POA: Diagnosis not present

## 2021-08-03 ENCOUNTER — Telehealth: Payer: Self-pay | Admitting: Pulmonary Disease

## 2021-08-04 ENCOUNTER — Ambulatory Visit (INDEPENDENT_AMBULATORY_CARE_PROVIDER_SITE_OTHER): Payer: Commercial Managed Care - HMO | Admitting: Urology

## 2021-08-04 VITALS — BP 153/78 | HR 67

## 2021-08-04 DIAGNOSIS — N200 Calculus of kidney: Secondary | ICD-10-CM | POA: Diagnosis not present

## 2021-08-04 DIAGNOSIS — R350 Frequency of micturition: Secondary | ICD-10-CM

## 2021-08-04 DIAGNOSIS — N410 Acute prostatitis: Secondary | ICD-10-CM | POA: Diagnosis not present

## 2021-08-04 LAB — URINALYSIS, ROUTINE W REFLEX MICROSCOPIC
Bilirubin, UA: NEGATIVE
Glucose, UA: NEGATIVE
Ketones, UA: NEGATIVE
Leukocytes,UA: NEGATIVE
Nitrite, UA: NEGATIVE
Protein,UA: NEGATIVE
RBC, UA: NEGATIVE
Specific Gravity, UA: 1.02 (ref 1.005–1.030)
Urobilinogen, Ur: 0.2 mg/dL (ref 0.2–1.0)
pH, UA: 6 (ref 5.0–7.5)

## 2021-08-04 MED ORDER — DOXYCYCLINE HYCLATE 100 MG PO CAPS: 100.0000 mg | ORAL_CAPSULE | Freq: Two times a day (BID) | ORAL | 0 refills | Status: AC

## 2021-08-04 NOTE — Progress Notes (Signed)
08/04/2021 10:27 AM   Charles Mathews [birth year] [id number]  Referring provider: Bonnita Hollow, MD [place],  [place] [address]  No chief complaint on file. Recheck stones  HPI: 08/04/21  [name] presents for fu of nephrolithiasis. He continues to c/o intermittent right flank pain. Pt also c/o 2 week h/o urinary frequency and nocturia 3-4x/night with c/o weak stream and intermittency. No burning, hematuria. No fever, chills, abdominal pain. Pt admits to poorly controlled glucose levels. On finasteride, flomax. Admits to limited water/fluid intake om general. Renal US 07/28/21 negative for calculi. Normal study. IPPS=21 QOL=5 UA-clear today PVR=69m  01/29/21 [name] a 61yohere for followup for nephrolithiasis. He has intermittent right flank pain. Renal [place] shows left lower pole calcification and no right renal calculi. No LUTS. 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[place]    Seizures ([place]     10/27/20- last one age 61-61  Sleep apnea    Urinary urgency     Surgical History: Past Surgical History:  Procedure Laterality Date   ARTERY BIOPSY N/A 10/28/2020   Procedure: BIOPSY TEMPORAL ARTERY;  Surgeon: ERosetta Posner MD;  [facility];  Service: Vascular;  Laterality: N/A;   BIOPSY  07/06/2021   Procedure: BIOPSY;  Surgeon: CHarvel Quale MD;  [facility];  Service: Gastroenterology;;   COLONOSCOPY WITH PROPOFOL N/A 07/06/2021   Procedure: COLONOSCOPY WITH  PROPOFOL;  Surgeon: CHarvel Quale MD;  [facility];  Service: Gastroenterology;  Laterality: N/A;  10:15   CYSTOSCOPY WITH RETROGRADE PYELOGRAM, URETEROSCOPY AND STENT PLACEMENT Left 11/30/2020   Procedure: CYSTOSCOPY WITH LEFT RETROGRADE PYELOGRAM, LEFT URETEROSCOPY AND LEFT STENT PLACEMENT;  Surgeon: MCleon Gustin MD;  Location: [facility];  Service: Urology;  Laterality: Left;   ESOPHAGOGASTRODUODENOSCOPY (EGD) WITH PROPOFOL N/A 07/06/2021   Procedure: ESOPHAGOGASTRODUODENOSCOPY (EGD) WITH PROPOFOL;  Surgeon: CHarvel Quale MD;  [facility];  Service: Gastroenterology;  Laterality: N/A;   LITHOTRIPSY  2009   MULTIPLE EXTRACTIONS WITH ALVEOLOPLASTY N/A 12/17/2012   Procedure: MULTIPLE EXTRACTION WITH ALVEOLOPLASTY;  Surgeon: SGae Bon DDS;  [place] [facility]  Service: Oral Surgery;  Laterality: N/A;   POLYPECTOMY  07/06/2021   Procedure: POLYPECTOMY;  Surgeon: CHarvel Quale MD;  [facility];  Service: Gastroenterology;;   SAzzie AlmasDILATION  07/06/2021   Procedure: SAzzie AlmasDILATION;  Surgeon: CMontez Morita DQuillian Quince MD;  [facility];  Service: Gastroenterology;;    Home Medications:  Allergies as of 08/04/2021       Reactions   Bee Venom Anaphylaxis   Benadryl [diphenhydramine]    Makes him feel weird, like he is going to pass out   Lorazepam Other (See Comments)   Makes him feel woozy.   Penicillins Other (See Comments)   CHILDHOOD ALLERGY  Latex Rash        Medication List        Accurate as of August 04, 2021 10:27 AM. If you have any questions, ask your nurse or doctor.          Accu-Chek Guide test strip Generic drug: glucose blood USE ONCE DAILY TO TEST SUGAR   Accu-Chek Guide w/Device Kit USE ONCE DAILY TO TEST SUAGR   Accu-Chek Softclix Lancets lancets USE TO TEST ONCE DAILY   acetaminophen 650 MG CR tablet Commonly known as: TYLENOL Take 1,300 mg by mouth every 8  (eight) hours as needed for pain.   albuterol 108 (90 Base) MCG/ACT inhaler Commonly known as: VENTOLIN HFA Inhale 1 puff into the lungs every 6 (six) hours as needed for wheezing or shortness of breath.   aspirin EC 81 MG tablet Take 81 mg by mouth daily.   benztropine 1 MG tablet Commonly known as: COGENTIN Take 1 mg by mouth daily.   Breztri Aerosphere 160-9-4.8 MCG/ACT Aero Generic drug: Budeson-Glycopyrrol-Formoterol Inhale 2 puffs into the lungs in the morning and at bedtime.   candesartan 16 MG tablet Commonly known as: ATACAND Take 8 mg by mouth daily.   cetirizine 10 MG tablet Commonly known as: ZYRTEC Take 10 mg by mouth daily.   Clear Eyes for Dry Eyes 1-0.25 % Soln Generic drug: Carboxymethylcellul-Glycerin Place 1 drop into both eyes daily as needed (dry eyes).   dicyclomine 10 MG capsule Commonly known as: BENTYL Take 1 capsule (10 mg total) by mouth 3 (three) times daily as needed for spasms.   doxazosin 2 MG tablet Commonly known as: CARDURA TAKE 1 TABLET BY MOUTH DAILY   doxycycline 100 MG capsule Commonly known as: VIBRAMYCIN Take 1 capsule (100 mg total) by mouth every 12 (twelve) hours for 14 days.   Emgality 120 MG/ML Soaj Generic drug: Galcanezumab-gnlm INJECT 120 MG into THE SKIN EVERY 28 DAYS   finasteride 5 MG tablet Commonly known as: PROSCAR Take 5 mg by mouth daily.   fluticasone 50 MCG/ACT nasal spray Commonly known as: FLONASE INSTILL ONE SPRAY INTO BOTH NOSTRILS DAILY What changed: See the new instructions.   furosemide 40 MG tablet Commonly known as: LASIX Take 1 tablet (40 mg total) by mouth daily.   gabapentin 100 MG capsule Commonly known as: NEURONTIN Take 100 mg by mouth at bedtime.   meclizine 25 MG tablet Commonly known as: ANTIVERT Take 12.5 mg by mouth daily.   Melatonin 10 MG Tabs Take 10 mg by mouth at bedtime.   metFORMIN 500 MG tablet Commonly known as: GLUCOPHAGE Take 500 mg by mouth daily.    metoprolol tartrate 50 MG tablet Commonly known as: LOPRESSOR TAKE 1 TABLET BY MOUTH TWICE DAILY   multivitamin tablet Take 2 tablets by mouth daily. Gummies   omeprazole 20 MG capsule Commonly known as: PRILOSEC Take 1 capsule (20 mg total) by mouth 2 (two) times daily before a meal.   ondansetron 4 MG disintegrating tablet Commonly known as: Zofran ODT Take 1 tablet (4 mg total) by mouth every 8 (eight) hours as needed for nausea or vomiting.   oxyCODONE-acetaminophen 5-325 MG tablet Commonly known as: Percocet Take 1 tablet by mouth every 4 (four) hours as needed.   Ozempic (0.25 or 0.5 MG/DOSE) 2 MG/1.5ML Sopn Generic drug: Semaglutide(0.25 or 0.5MG/DOS) Inject 0.25 mg into the skin every Friday.   paliperidone 3 MG 24 hr tablet Commonly known as: INVEGA Take 3 mg by mouth daily.  phenazopyridine 100 MG tablet Commonly known as: Pyridium Take 1 tablet (100 mg total) by mouth 3 (three) times daily as needed for pain.   potassium chloride SA 20 MEQ tablet Commonly known as: KLOR-CON M Take 1 tablet (20 mEq total) by mouth daily.   rosuvastatin 40 MG tablet Commonly known as: CRESTOR Take 40 mg by mouth daily.   sertraline 100 MG tablet Commonly known as: ZOLOFT Take 100 mg by mouth at bedtime.   tamsulosin 0.4 MG Caps capsule Commonly known as: FLOMAX Take 0.4 mg by mouth daily.   topiramate 50 MG tablet Commonly known as: TOPAMAX Take 50 mg by mouth daily.   traMADol 50 MG tablet Commonly known as: ULTRAM Take 50 mg by mouth every 6 (six) hours as needed for severe pain.   traZODone 100 MG tablet Commonly known as: DESYREL Take 100 mg by mouth at bedtime.   Vitamin D (Ergocalciferol) 1.25 MG (50000 UNIT) Caps capsule Commonly known as: DRISDOL Take 50,000 Units by mouth every 7 (seven) days.        Allergies:  Allergies  Allergen Reactions   Bee Venom Anaphylaxis   Benadryl [Diphenhydramine]     Makes him feel weird, like he is going to  pass out   Lorazepam Other (See Comments)    Makes him feel woozy.   Penicillins Other (See Comments)    CHILDHOOD ALLERGY   Latex Rash    Family History: Family History  Adopted: Yes    Social History:  reports that he quit smoking about 19 years ago. His smoking use included cigarettes. He has a 54.00 pack-year smoking history. He has been exposed to tobacco smoke. He has never used smokeless tobacco. He reports that he does not drink alcohol and does not use drugs.  ROS: All other review of systems were reviewed and are negative except what is noted above in HPI  Physical Exam: BP (!) 153/78    Pulse 67   Constitutional:  Alert and oriented, No acute distress. HEENT: [place] AT, moist mucus membranes.  Trachea midline, no masses. Cardiovascular: No clubbing, cyanosis, or edema. Respiratory: Normal respiratory effort, no increased work of breathing. GI: Abdomen is soft, mildly tender suprapubic aspect. No masses, gaurding. GU: Left sided CVA tenderness.  Lymph: No cervical or inguinal lymphadenopathy. Skin: No rashes, bruises or suspicious lesions. Neurologic: Grossly intact, no focal deficits, moving all 4 extremities. Psychiatric: Normal mood and affect.  Laboratory Data: Lab Results  Component Value Date   WBC 9.1 11/27/2020   HGB 13.6 11/30/2020   HCT 40.0 11/30/2020   MCV 99.1 11/27/2020   PLT 130 (L) 11/27/2020    Lab Results  Component Value Date   CREATININE 1.19 07/01/2021    Lab Results  Component Value Date   PSA 0.15 04/02/2011    No results found for: TESTOSTERONE  Lab Results  Component Value Date   HGBA1C 6.4 (H) 11/27/2020    Urinalysis    Component Value Date/Time   COLORURINE YELLOW 04/01/2011 1533   APPEARANCEUR Clear 01/29/2021 1006   LABSPEC 1.015 04/01/2011 1533   PHURINE 8.0 04/01/2011 1533   GLUCOSEU Negative 01/29/2021 1006   HGBUR NEGATIVE 04/01/2011 1533   BILIRUBINUR Negative 01/29/2021 [place] 04/01/2011  1533   PROTEINUR Negative 01/29/2021 1006   PROTEINUR NEGATIVE 04/01/2011 1533   UROBILINOGEN 0.2 02/12/2020 1144   UROBILINOGEN 0.2 04/01/2011 1533   NITRITE Negative 01/29/2021 1006   NITRITE NEGATIVE 04/01/2011 1533   LEUKOCYTESUR Negative  01/29/2021 1006    Lab Results  Component Value Date   LABMICR Comment 01/29/2021   WBCUA None seen 12/07/2020   LABEPIT None seen 12/07/2020   MUCUS Present 12/07/2020   BACTERIA None seen 12/07/2020    Pertinent Imaging: Narrative & Impression  CLINICAL DATA:  Nephrolithiasis follow-up.   EXAM: RENAL / URINARY TRACT ULTRASOUND COMPLETE   COMPARISON:  CT abdomen pelvis dated May 25, 2021. Renal ultrasound dated January 25, 2021.   FINDINGS: Right Kidney:   Renal measurements: 12.8 x 6.1 x 5.5 cm = volume: 226 mL. Echogenicity within normal limits. No mass or hydronephrosis visualized.   Left Kidney:   Renal measurements: 12.3 x 4.0 x 4.9 cm = volume: 128 mL. Echogenicity within normal limits. No mass or hydronephrosis visualized.   Bladder:   Appears normal for degree of bladder distention.   Other:   None.   IMPRESSION: 1. Normal renal ultrasound.     Electronically Signed   By: Titus Dubin M.D.   On: 07/29/2021 14:28   Results for orders placed during the hospital encounter of 10/11/18  DG Abd 1 View  Narrative CLINICAL DATA:  Right flank soreness.  EXAM: ABDOMEN - 1 VIEW  COMPARISON:  Abdominal radiographs, 06/27/2018.  CT, 03/03/2018.  FINDINGS: No evidence of renal or ureteral stones. Normal bowel gas pattern. Soft tissues are unremarkable. Skeletal structures are within normal limits.  IMPRESSION: Negative exam. No evidence of renal or ureteral stones. Intrarenal stones noted on the prior CT are either no longer present or, more likely, not visualized due to overlying bowel gas and stool.   Electronically Signed By: Lajean Manes M.D. On: 10/11/2018 20:56  No results found for this  or any previous visit.  No results found for this or any previous visit.  No results found for this or any previous visit.  Results for orders placed during the hospital encounter of 07/28/21  Ultrasound renal complete  Narrative CLINICAL DATA:  Nephrolithiasis follow-up.  EXAM: RENAL / URINARY TRACT ULTRASOUND COMPLETE  COMPARISON:  CT abdomen pelvis dated May 25, 2021. Renal ultrasound dated January 25, 2021.  FINDINGS: Right Kidney:  Renal measurements: 12.8 x 6.1 x 5.5 cm = volume: 226 mL. Echogenicity within normal limits. No mass or hydronephrosis visualized.  Left Kidney:  Renal measurements: 12.3 x 4.0 x 4.9 cm = volume: 128 mL. Echogenicity within normal limits. No mass or hydronephrosis visualized.  Bladder:  Appears normal for degree of bladder distention.  Other:  None.  IMPRESSION: 1. Normal renal ultrasound.   Electronically Signed By: Titus Dubin M.D. On: 07/29/2021 14:28  No results found for this or any previous visit.  No results found for this or any previous visit.  No results found for this or any previous visit.   Assessment & Plan:    1. Nephrolithiasis 2 years with FU renal US. Will return in 6 months for final [place]. - Urinalysis, Routine w reflex microscopic  2. Urinary frequency, suspect UTI/prostatitis with recent onset LUTS Tx with Doxy x 2 weeks and FU for recheck if sxs persist.   Return in about 6 months (around 02/01/2022).  [place], Berneice Heinrich, PA-C  [facility] [place]

## 2021-08-04 NOTE — Progress Notes (Signed)
PVR 6

## 2021-08-04 NOTE — Progress Notes (Signed)
Urological Symptom Review  Patient is experiencing the following symptoms: Get up at night to urinate Leakage of urine Trouble starting stream   Review of Systems  Gastrointestinal (upper)  : Negative for upper GI symptoms  Gastrointestinal (lower) : Negative for lower GI symptoms  Constitutional : Negative for symptoms  Skin: Negative for skin symptoms  Eyes: Negative for eye symptoms  Ear/Nose/Throat : Negative for Ear/Nose/Throat symptoms  Hematologic/Lymphatic: Negative for Hematologic/Lymphatic symptoms  Cardiovascular : Negative for cardiovascular symptoms  Respiratory : Cough Shortness of breath  Endocrine: Negative for endocrine symptoms  Musculoskeletal: Joint pain  Neurological: Headaches  Psychologic: Depression Anxiety

## 2021-08-05 ENCOUNTER — Telehealth: Payer: Self-pay

## 2021-08-05 NOTE — Telephone Encounter (Signed)
New message   EGIDIO LOFGREN (Key: [id number]) Emgality 120MG /ML auto-injectors (migraine)   Form [employer] Medicare Part D Electronic Prior Authorization Form (2017 NCPDP) Created 3 days ago Sent to Plan less than a minute ago Plan Response less than a minute ago Submit Clinical Questions Determination Message from Plan This medication or product was previously approved on [id number] from 2021-08-01 to 2022-07-31. **Please note: This request was submitted electronically. Formulary lowering, tiering exception, cost reduction and/or pre-benefit determination review (including prospective Medicare hospice reviews) requests cannot be requested using this method of submission. Providers contact us at [phone] for further assistance.

## 2021-08-05 NOTE — Telephone Encounter (Signed)
[place] but she did not answer. Left message for her to call us back. The order for the bipap and O2 was placed on 07/02/21 and was sent to [facility].

## 2021-08-05 NOTE — Telephone Encounter (Signed)
Spoke with Charles Mathews. She stated that [facility] stated that they did not receive the order. I advised her that order was sent to [facility] on 12/2 and that we received a confirmation on 12/6. Per Charles Mathews, [facility] has not received the order. Advised her that I would check with the PCCs to see if they had received anything else for the order. Sent a Teams message to [facility] staff.   She verbalized understanding. Nothing further needed.

## 2021-08-15 ENCOUNTER — Other Ambulatory Visit: Payer: Self-pay | Admitting: Cardiology

## 2021-08-22 DIAGNOSIS — J411 Mucopurulent chronic bronchitis: Secondary | ICD-10-CM | POA: Diagnosis not present

## 2021-08-22 DIAGNOSIS — J449 Chronic obstructive pulmonary disease, unspecified: Secondary | ICD-10-CM | POA: Diagnosis not present

## 2021-08-29 DIAGNOSIS — I2510 Atherosclerotic heart disease of native coronary artery without angina pectoris: Secondary | ICD-10-CM | POA: Diagnosis not present

## 2021-08-29 DIAGNOSIS — I10 Essential (primary) hypertension: Secondary | ICD-10-CM | POA: Diagnosis not present

## 2021-09-08 ENCOUNTER — Telehealth: Payer: Self-pay | Admitting: Pulmonary Disease

## 2021-09-09 NOTE — Telephone Encounter (Signed)
Called and spoke to [place], pts CNA (on [place]). She states the pt was advised by the sleep tech that the pt needs to be on a bipap not a cpap. Donia Guiles that Dr. Halford Chessman reviewed the CPAP titration study and advised that pt be placed on CPAP with 2lpm O2. Myriam Jacobson states they refused this previously because she was under the impression we were going to order a BiPAP. Advised Myriam Jacobson there is nothing in pt's chart that indicates Dr. Halford Chessman wanted pt on BiPAP and the pt should follow Dr. Collie Siad recs for his OSA. Donia Guiles to call back [facility] and move forward with the CPAP with O2. Advised her to call us back if there are any issues. She verbalized understanding and denied any further questions or concerns at this time.

## 2021-09-14 DIAGNOSIS — H9201 Otalgia, right ear: Secondary | ICD-10-CM | POA: Diagnosis not present

## 2021-09-14 DIAGNOSIS — H6120 Impacted cerumen, unspecified ear: Secondary | ICD-10-CM | POA: Diagnosis not present

## 2021-09-22 DIAGNOSIS — J449 Chronic obstructive pulmonary disease, unspecified: Secondary | ICD-10-CM | POA: Diagnosis not present

## 2021-09-22 DIAGNOSIS — J411 Mucopurulent chronic bronchitis: Secondary | ICD-10-CM | POA: Diagnosis not present

## 2021-09-23 ENCOUNTER — Other Ambulatory Visit (INDEPENDENT_AMBULATORY_CARE_PROVIDER_SITE_OTHER): Payer: Self-pay | Admitting: Gastroenterology

## 2021-09-23 NOTE — Telephone Encounter (Signed)
Per Egd 07/06/2021 - Use Prilosec (omeprazole) 20 mg PO BID for 3 months, then decrease to once a day.

## 2021-09-30 DIAGNOSIS — G4733 Obstructive sleep apnea (adult) (pediatric): Secondary | ICD-10-CM | POA: Diagnosis not present

## 2021-10-04 DIAGNOSIS — G4733 Obstructive sleep apnea (adult) (pediatric): Secondary | ICD-10-CM | POA: Diagnosis not present

## 2021-10-04 DIAGNOSIS — J449 Chronic obstructive pulmonary disease, unspecified: Secondary | ICD-10-CM | POA: Diagnosis not present

## 2021-10-08 DIAGNOSIS — Z880 Allergy status to penicillin: Secondary | ICD-10-CM | POA: Diagnosis not present

## 2021-10-08 DIAGNOSIS — Z888 Allergy status to other drugs, medicaments and biological substances status: Secondary | ICD-10-CM | POA: Diagnosis not present

## 2021-10-08 DIAGNOSIS — S0990XA Unspecified injury of head, initial encounter: Secondary | ICD-10-CM | POA: Diagnosis not present

## 2021-10-08 DIAGNOSIS — I10 Essential (primary) hypertension: Secondary | ICD-10-CM | POA: Diagnosis not present

## 2021-10-08 DIAGNOSIS — Z79899 Other long term (current) drug therapy: Secondary | ICD-10-CM | POA: Diagnosis not present

## 2021-10-08 DIAGNOSIS — R42 Dizziness and giddiness: Secondary | ICD-10-CM | POA: Diagnosis not present

## 2021-10-08 DIAGNOSIS — K529 Noninfective gastroenteritis and colitis, unspecified: Secondary | ICD-10-CM | POA: Diagnosis not present

## 2021-10-08 DIAGNOSIS — R55 Syncope and collapse: Secondary | ICD-10-CM | POA: Diagnosis not present

## 2021-10-08 DIAGNOSIS — Z91030 Bee allergy status: Secondary | ICD-10-CM | POA: Diagnosis not present

## 2021-10-08 DIAGNOSIS — R109 Unspecified abdominal pain: Secondary | ICD-10-CM | POA: Diagnosis not present

## 2021-10-08 DIAGNOSIS — J449 Chronic obstructive pulmonary disease, unspecified: Secondary | ICD-10-CM | POA: Diagnosis not present

## 2021-10-08 DIAGNOSIS — Z20822 Contact with and (suspected) exposure to covid-19: Secondary | ICD-10-CM | POA: Diagnosis not present

## 2021-10-16 ENCOUNTER — Other Ambulatory Visit: Payer: Self-pay | Admitting: Cardiology

## 2021-10-16 ENCOUNTER — Other Ambulatory Visit: Payer: Self-pay | Admitting: Physician Assistant

## 2021-10-18 ENCOUNTER — Telehealth: Payer: Self-pay | Admitting: *Deleted

## 2021-10-18 NOTE — Telephone Encounter (Signed)
Refill request received from [facility] for simvastatin ?Contacted aide/CNA Myriam Jacobson to verify patient no longer takes simvastatin as reported at last visit in August 2022 ?Per Myriam Jacobson, patient does not take simvastatin ?Refill request denied ?

## 2021-10-20 DIAGNOSIS — E1165 Type 2 diabetes mellitus with hyperglycemia: Secondary | ICD-10-CM | POA: Diagnosis not present

## 2021-10-20 DIAGNOSIS — K2100 Gastro-esophageal reflux disease with esophagitis, without bleeding: Secondary | ICD-10-CM | POA: Diagnosis not present

## 2021-10-20 DIAGNOSIS — J449 Chronic obstructive pulmonary disease, unspecified: Secondary | ICD-10-CM | POA: Diagnosis not present

## 2021-10-20 DIAGNOSIS — E876 Hypokalemia: Secondary | ICD-10-CM | POA: Diagnosis not present

## 2021-10-20 DIAGNOSIS — J411 Mucopurulent chronic bronchitis: Secondary | ICD-10-CM | POA: Diagnosis not present

## 2021-10-20 DIAGNOSIS — E7849 Other hyperlipidemia: Secondary | ICD-10-CM | POA: Diagnosis not present

## 2021-10-20 DIAGNOSIS — I10 Essential (primary) hypertension: Secondary | ICD-10-CM | POA: Diagnosis not present

## 2021-10-29 DIAGNOSIS — I2510 Atherosclerotic heart disease of native coronary artery without angina pectoris: Secondary | ICD-10-CM | POA: Diagnosis not present

## 2021-10-29 DIAGNOSIS — I10 Essential (primary) hypertension: Secondary | ICD-10-CM | POA: Diagnosis not present

## 2021-11-08 DIAGNOSIS — J441 Chronic obstructive pulmonary disease with (acute) exacerbation: Secondary | ICD-10-CM | POA: Diagnosis not present

## 2021-11-17 ENCOUNTER — Ambulatory Visit: Payer: Commercial Managed Care - HMO | Admitting: Pulmonary Disease

## 2021-11-28 DIAGNOSIS — R61 Generalized hyperhidrosis: Secondary | ICD-10-CM | POA: Diagnosis not present

## 2021-11-28 DIAGNOSIS — R0689 Other abnormalities of breathing: Secondary | ICD-10-CM | POA: Diagnosis not present

## 2021-11-28 DIAGNOSIS — R0789 Other chest pain: Secondary | ICD-10-CM | POA: Diagnosis not present

## 2021-11-28 DIAGNOSIS — R079 Chest pain, unspecified: Secondary | ICD-10-CM | POA: Diagnosis not present

## 2021-11-28 DIAGNOSIS — R0902 Hypoxemia: Secondary | ICD-10-CM | POA: Diagnosis not present

## 2021-11-29 DIAGNOSIS — J449 Chronic obstructive pulmonary disease, unspecified: Secondary | ICD-10-CM | POA: Diagnosis not present

## 2021-11-29 DIAGNOSIS — G4733 Obstructive sleep apnea (adult) (pediatric): Secondary | ICD-10-CM | POA: Diagnosis not present

## 2021-11-29 DIAGNOSIS — J411 Mucopurulent chronic bronchitis: Secondary | ICD-10-CM | POA: Diagnosis not present

## 2021-12-01 DIAGNOSIS — R0789 Other chest pain: Secondary | ICD-10-CM | POA: Diagnosis not present

## 2021-12-04 DIAGNOSIS — R059 Cough, unspecified: Secondary | ICD-10-CM | POA: Diagnosis not present

## 2021-12-04 DIAGNOSIS — Z20822 Contact with and (suspected) exposure to covid-19: Secondary | ICD-10-CM | POA: Diagnosis not present

## 2021-12-04 DIAGNOSIS — Z888 Allergy status to other drugs, medicaments and biological substances status: Secondary | ICD-10-CM | POA: Diagnosis not present

## 2021-12-04 DIAGNOSIS — I10 Essential (primary) hypertension: Secondary | ICD-10-CM | POA: Diagnosis not present

## 2021-12-04 DIAGNOSIS — J449 Chronic obstructive pulmonary disease, unspecified: Secondary | ICD-10-CM | POA: Diagnosis not present

## 2021-12-04 DIAGNOSIS — R1031 Right lower quadrant pain: Secondary | ICD-10-CM | POA: Diagnosis not present

## 2021-12-04 DIAGNOSIS — R531 Weakness: Secondary | ICD-10-CM | POA: Diagnosis not present

## 2021-12-04 DIAGNOSIS — Z743 Need for continuous supervision: Secondary | ICD-10-CM | POA: Diagnosis not present

## 2021-12-04 DIAGNOSIS — E876 Hypokalemia: Secondary | ICD-10-CM | POA: Diagnosis not present

## 2021-12-04 DIAGNOSIS — R1084 Generalized abdominal pain: Secondary | ICD-10-CM | POA: Diagnosis not present

## 2021-12-04 DIAGNOSIS — R0689 Other abnormalities of breathing: Secondary | ICD-10-CM | POA: Diagnosis not present

## 2021-12-04 DIAGNOSIS — Z91012 Allergy to eggs: Secondary | ICD-10-CM | POA: Diagnosis not present

## 2021-12-04 DIAGNOSIS — Z880 Allergy status to penicillin: Secondary | ICD-10-CM | POA: Diagnosis not present

## 2021-12-04 DIAGNOSIS — Z91040 Latex allergy status: Secondary | ICD-10-CM | POA: Diagnosis not present

## 2021-12-22 ENCOUNTER — Other Ambulatory Visit: Payer: Self-pay | Admitting: Cardiology

## 2021-12-30 DIAGNOSIS — J411 Mucopurulent chronic bronchitis: Secondary | ICD-10-CM | POA: Diagnosis not present

## 2021-12-30 DIAGNOSIS — J449 Chronic obstructive pulmonary disease, unspecified: Secondary | ICD-10-CM | POA: Diagnosis not present

## 2021-12-31 DIAGNOSIS — G4733 Obstructive sleep apnea (adult) (pediatric): Secondary | ICD-10-CM | POA: Diagnosis not present

## 2022-01-15 ENCOUNTER — Other Ambulatory Visit: Payer: Self-pay | Admitting: Cardiology

## 2022-01-18 DIAGNOSIS — R5383 Other fatigue: Secondary | ICD-10-CM | POA: Diagnosis not present

## 2022-01-18 DIAGNOSIS — E559 Vitamin D deficiency, unspecified: Secondary | ICD-10-CM | POA: Diagnosis not present

## 2022-01-18 DIAGNOSIS — E876 Hypokalemia: Secondary | ICD-10-CM | POA: Diagnosis not present

## 2022-01-18 DIAGNOSIS — K219 Gastro-esophageal reflux disease without esophagitis: Secondary | ICD-10-CM | POA: Diagnosis not present

## 2022-01-18 DIAGNOSIS — E782 Mixed hyperlipidemia: Secondary | ICD-10-CM | POA: Diagnosis not present

## 2022-01-18 DIAGNOSIS — Z1329 Encounter for screening for other suspected endocrine disorder: Secondary | ICD-10-CM | POA: Diagnosis not present

## 2022-01-18 DIAGNOSIS — E7849 Other hyperlipidemia: Secondary | ICD-10-CM | POA: Diagnosis not present

## 2022-01-18 DIAGNOSIS — D519 Vitamin B12 deficiency anemia, unspecified: Secondary | ICD-10-CM | POA: Diagnosis not present

## 2022-01-18 DIAGNOSIS — I10 Essential (primary) hypertension: Secondary | ICD-10-CM | POA: Diagnosis not present

## 2022-01-18 DIAGNOSIS — E1122 Type 2 diabetes mellitus with diabetic chronic kidney disease: Secondary | ICD-10-CM | POA: Diagnosis not present

## 2022-01-27 DIAGNOSIS — J9611 Chronic respiratory failure with hypoxia: Secondary | ICD-10-CM | POA: Diagnosis not present

## 2022-01-27 DIAGNOSIS — E876 Hypokalemia: Secondary | ICD-10-CM | POA: Diagnosis not present

## 2022-01-27 DIAGNOSIS — Z23 Encounter for immunization: Secondary | ICD-10-CM | POA: Diagnosis not present

## 2022-01-27 DIAGNOSIS — G4733 Obstructive sleep apnea (adult) (pediatric): Secondary | ICD-10-CM | POA: Diagnosis not present

## 2022-01-27 DIAGNOSIS — E782 Mixed hyperlipidemia: Secondary | ICD-10-CM | POA: Diagnosis not present

## 2022-01-27 DIAGNOSIS — E1165 Type 2 diabetes mellitus with hyperglycemia: Secondary | ICD-10-CM | POA: Diagnosis not present

## 2022-01-27 DIAGNOSIS — E7849 Other hyperlipidemia: Secondary | ICD-10-CM | POA: Diagnosis not present

## 2022-01-27 DIAGNOSIS — Z0001 Encounter for general adult medical examination with abnormal findings: Secondary | ICD-10-CM | POA: Diagnosis not present

## 2022-01-27 DIAGNOSIS — I10 Essential (primary) hypertension: Secondary | ICD-10-CM | POA: Diagnosis not present

## 2022-01-27 DIAGNOSIS — J449 Chronic obstructive pulmonary disease, unspecified: Secondary | ICD-10-CM | POA: Diagnosis not present

## 2022-01-27 DIAGNOSIS — K2100 Gastro-esophageal reflux disease with esophagitis, without bleeding: Secondary | ICD-10-CM | POA: Diagnosis not present

## 2022-01-31 ENCOUNTER — Ambulatory Visit: Payer: Commercial Managed Care - HMO | Admitting: Cardiology

## 2022-01-31 NOTE — Progress Notes (Deleted)
Cardiology Office Note  Date: 01/31/2022   ID: Charles Mathews, DOB 07/26/1961, MRN [id number]  PCP:  Bonnita Hollow, MD  Cardiologist:  Rozann Lesches, MD Electrophysiologist:  None   No chief complaint on file.   History of Present Illness: Charles Mathews is a 61 y.o. male last seen in August 2022.  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[place])    Seizures ([place])     10/27/20- last one age 68-14   Sleep apnea    Urinary urgency     Past Surgical History:  Procedure Laterality Date   ARTERY BIOPSY N/A 10/28/2020   Procedure: BIOPSY TEMPORAL ARTERY;  Surgeon: Rosetta Posner, MD;  Location: [facility] OR;  Service: Vascular;  Laterality: N/A;   BIOPSY  07/06/2021   Procedure: BIOPSY;  Surgeon: Harvel Quale, MD;  Location: [facility];  Service: Gastroenterology;;   COLONOSCOPY WITH PROPOFOL N/A 07/06/2021   Procedure: COLONOSCOPY WITH PROPOFOL;  Surgeon: Harvel Quale, MD;  Location: [facility];  Service: Gastroenterology;  Laterality: N/A;  10:15   CYSTOSCOPY WITH RETROGRADE PYELOGRAM, URETEROSCOPY AND STENT PLACEMENT Left 11/30/2020   Procedure: CYSTOSCOPY WITH LEFT RETROGRADE PYELOGRAM, LEFT URETEROSCOPY AND LEFT STENT PLACEMENT;  Surgeon: Cleon Gustin, MD;  Location: [facility];  Service: Urology;  Laterality: Left;   ESOPHAGOGASTRODUODENOSCOPY (EGD) WITH PROPOFOL N/A 07/06/2021   Procedure: ESOPHAGOGASTRODUODENOSCOPY (EGD) WITH PROPOFOL;  Surgeon: Harvel Quale, MD;  Location: [facility];  Service:  Gastroenterology;  Laterality: N/A;   LITHOTRIPSY  2009   MULTIPLE EXTRACTIONS WITH ALVEOLOPLASTY N/A 12/17/2012   Procedure: MULTIPLE EXTRACTION WITH ALVEOLOPLASTY;  Surgeon: Gae Bon, DDS;  Location: [place];  Service: Oral Surgery;  Laterality: N/A;   POLYPECTOMY  07/06/2021   Procedure: POLYPECTOMY;  Surgeon: Harvel Quale, MD;  [facility]: [facility];  Service: Gastroenterology;;   Azzie Almas DILATION  07/06/2021   Procedure: Azzie Almas DILATION;  Surgeon: Montez Morita, Quillian Quince, MD;  Location: [facility];  Service: Gastroenterology;;    Current Outpatient Medications  Medication Sig Dispense Refill   ACCU-CHEK GUIDE test strip USE ONCE DAILY TO TEST SUGAR     Accu-Chek Softclix Lancets lancets USE TO TEST ONCE DAILY     acetaminophen (TYLENOL) 650 MG CR tablet Take 1,300 mg by mouth every 8 (eight) hours as needed for pain.     albuterol (VENTOLIN HFA) 108 (90 Base) MCG/ACT inhaler Inhale 1 puff into the lungs every 6 (six) hours as needed for wheezing or shortness of breath.     aspirin EC 81 MG tablet Take 81 mg by mouth daily.     benztropine (COGENTIN) 1 MG tablet Take 1 mg by mouth daily.     Blood Glucose Monitoring Suppl (ACCU-CHEK GUIDE) w/Device KIT USE ONCE DAILY TO TEST SUAGR     Budeson-Glycopyrrol-Formoterol (BREZTRI AEROSPHERE) 160-9-4.8 MCG/ACT AERO Inhale 2 puffs into the  lungs in the morning and at bedtime.     candesartan (ATACAND) 16 MG tablet Take 8 mg by mouth daily.     Carboxymethylcellul-Glycerin (CLEAR EYES FOR DRY EYES) 1-0.25 % SOLN Place 1 drop into both eyes daily as needed (dry eyes).     cetirizine (ZYRTEC) 10 MG tablet Take 10 mg by mouth daily.     dicyclomine (BENTYL) 10 MG capsule Take 1 capsule (10 mg total) by mouth 3 (three) times daily as needed for spasms. 90 capsule 3   doxazosin (CARDURA) 2 MG tablet TAKE 1 TABLET BY MOUTH DAILY - MUST SCHEDULE APPOINTMENT FOR MORE REFILLS 30 tablet 1   EMGALITY 120 MG/ML SOAJ INJECT 120 MG into THE  SKIN EVERY 28 DAYS 1 mL 0   finasteride (PROSCAR) 5 MG tablet Take 5 mg by mouth daily.     fluticasone (FLONASE) 50 MCG/ACT nasal spray INSTILL ONE SPRAY INTO BOTH NOSTRILS DAILY (Patient taking differently: Place 1 spray into both nostrils daily as needed for allergies.) 16 g 11   furosemide (LASIX) 40 MG tablet TAKE 1 TABLET BY MOUTH DAILY 90 tablet 1   gabapentin (NEURONTIN) 100 MG capsule Take 100 mg by mouth at bedtime.     meclizine (ANTIVERT) 25 MG tablet Take 12.5 mg by mouth daily.     Melatonin 10 MG TABS Take 10 mg by mouth at bedtime.     metFORMIN (GLUCOPHAGE) 500 MG tablet Take 500 mg by mouth daily.     metoprolol tartrate (LOPRESSOR) 50 MG tablet TAKE 1 TABLET BY MOUTH TWICE DAILY 180 tablet 1   Multiple Vitamin (MULTIVITAMIN) tablet Take 2 tablets by mouth daily. Gummies     omeprazole (PRILOSEC) 20 MG capsule Take 1 capsule (20 mg total) by mouth daily. 90 capsule 3   ondansetron (ZOFRAN ODT) 4 MG disintegrating tablet Take 1 tablet (4 mg total) by mouth every 8 (eight) hours as needed for nausea or vomiting. 20 tablet 5   paliperidone (INVEGA) 3 MG 24 hr tablet Take 3 mg by mouth daily.     phenazopyridine (PYRIDIUM) 100 MG tablet Take 1 tablet (100 mg total) by mouth 3 (three) times daily as needed for pain. 15 tablet 0   potassium chloride SA (KLOR-CON M) 20 MEQ tablet TAKE 1 TABLET BY MOUTH DAILY 90 tablet 1   rosuvastatin (CRESTOR) 40 MG tablet Take 40 mg by mouth daily.     Semaglutide,0.25 or 0.5MG/DOS, (OZEMPIC, 0.25 OR 0.5 MG/DOSE,) 2 MG/1.5ML SOPN Inject 0.25 mg into the skin every Friday.     sertraline (ZOLOFT) 100 MG tablet Take 100 mg by mouth at bedtime.     tamsulosin (FLOMAX) 0.4 MG CAPS capsule Take 0.4 mg by mouth daily.     topiramate (TOPAMAX) 50 MG tablet Take 50 mg by mouth daily.     traMADol (ULTRAM) 50 MG tablet Take 50 mg by mouth every 6 (six) hours as needed for severe pain.     traZODone (DESYREL) 100 MG tablet Take 100 mg by mouth at bedtime.      Vitamin D, Ergocalciferol, (DRISDOL) 1.25 MG (50000 UNIT) CAPS capsule Take 50,000 Units by mouth every 7 (seven) days.     No current facility-administered medications for this visit.   Allergies:  Bee venom, Benadryl [diphenhydramine], Lorazepam, Penicillins, and Latex   Social History: The patient  reports that he quit smoking about 19 years ago. His smoking use included cigarettes. He has a 54.00 pack-year smoking history. He has been exposed  to tobacco smoke. He has never used smokeless tobacco. He reports that he does not drink alcohol and does not use drugs.   Family History: The patient's family history is not on file. He was adopted.   ROS:  Please see the history of present illness. Otherwise, complete review of systems is positive for {NONE DEFAULTED:18576}.  All other systems are reviewed and negative.   Physical Exam: VS:  There were no vitals taken for this visit., BMI There is no height or weight on file to calculate BMI.  Wt Readings from Last 3 Encounters:  07/06/21 (!) 328 lb 7.8 oz (149 kg)  07/01/21 (!) 328 lb (148.8 kg)  06/21/21 (!) 328 lb (148.8 kg)    General: Patient appears comfortable at rest. HEENT: Conjunctiva and lids normal, oropharynx clear with moist mucosa. Neck: Supple, no elevated JVP or carotid bruits, no thyromegaly. Lungs: Clear to auscultation, nonlabored breathing at rest. Cardiac: Regular rate and rhythm, no S3 or significant systolic murmur, no pericardial rub. Abdomen: Soft, nontender, no hepatomegaly, bowel sounds present, no guarding or rebound. Extremities: No pitting edema, distal pulses 2+. Skin: Warm and dry. Musculoskeletal: No kyphosis. Neuropsychiatric: Alert and oriented x3, affect grossly appropriate.  ECG:  An ECG dated 08/06/2020 was personally reviewed today and demonstrated:  Sinus rhythm.  Recent Labwork: 07/01/2021: BUN 27; Creatinine, Ser 1.19; Potassium 4.4; Sodium 141  May 2023: Hemoglobin 12.8, platelets 116,  potassium 3.2, BUN 14, creatinine 0.85, AST 11, ALT 11, magnesium 1.6  Other Studies Reviewed Today:  Echocardiogram 08/28/2020:  1. Left ventricular ejection fraction, by estimation, is 55 to 60%. The  left ventricle has normal function. The left ventricle has no regional  wall motion abnormalities. There is mild left ventricular hypertrophy.  Left ventricular diastolic parameters  were normal.   2. Right ventricular systolic function is normal. The right ventricular  size is normal.   3. The mitral valve is normal in structure. No evidence of mitral valve  regurgitation. No evidence of mitral stenosis.   4. The aortic valve is tricuspid. Aortic valve regurgitation is not  visualized. No aortic stenosis is present.    Lexiscan Myoview 09/01/2020: No diagnostic ST segment changes to indicate ischemia. Small, mild intensity, apical anteroseptal and basal inferolateral defects that are fixed and most consistent with soft tissue attenuation. This is a low risk study. Nuclear stress EF: 52%.  Assessment and Plan:    Medication Adjustments/Labs and Tests Ordered: Current medicines are reviewed at length with the patient today.  Concerns regarding medicines are outlined above.   Tests Ordered: No orders of the defined types were placed in this encounter.   Medication Changes: No orders of the defined types were placed in this encounter.   Disposition:  Follow up {follow up:15908}  Signed, Satira Sark, MD, [facility] 01/31/2022 7:58 AM    [place] [facility] [address], [place], [place] [address] Phone: [phone]; Fax: [phone]

## 2022-02-02 ENCOUNTER — Ambulatory Visit: Payer: Commercial Managed Care - HMO | Admitting: Urology

## 2022-02-10 ENCOUNTER — Ambulatory Visit (HOSPITAL_COMMUNITY): Admission: RE | Admit: 2022-02-10 | Payer: Medicare Other | Source: Ambulatory Visit

## 2022-02-10 ENCOUNTER — Other Ambulatory Visit: Payer: Self-pay | Admitting: Cardiology

## 2022-02-19 DIAGNOSIS — J411 Mucopurulent chronic bronchitis: Secondary | ICD-10-CM | POA: Diagnosis not present

## 2022-02-19 DIAGNOSIS — J449 Chronic obstructive pulmonary disease, unspecified: Secondary | ICD-10-CM | POA: Diagnosis not present

## 2022-02-28 DIAGNOSIS — J441 Chronic obstructive pulmonary disease with (acute) exacerbation: Secondary | ICD-10-CM | POA: Diagnosis not present

## 2022-02-28 DIAGNOSIS — I10 Essential (primary) hypertension: Secondary | ICD-10-CM | POA: Diagnosis not present

## 2022-02-28 DIAGNOSIS — I2510 Atherosclerotic heart disease of native coronary artery without angina pectoris: Secondary | ICD-10-CM | POA: Diagnosis not present

## 2022-03-16 ENCOUNTER — Ambulatory Visit: Payer: Self-pay | Admitting: Licensed Clinical Social Worker

## 2022-03-16 NOTE — Patient Outreach (Signed)
  Care Coordination   03/16/2022 Name: Charles Mathews MRN: [id number] DOB: [birth year]   Care Coordination Outreach Attempts:  An unsuccessful telephone outreach was attempted today to offer the patient information about available care coordination services as a benefit of their health plan.   Follow Up Plan:  Additional outreach attempts will be made to offer the patient care coordination information and services.   Encounter Outcome:  No Answer  Care Coordination Interventions Activated:  Yes   Care Coordination Interventions:  No, not indicated    Lenor Derrick, MSW  Social Worker [facility]  [phone]

## 2022-03-22 ENCOUNTER — Other Ambulatory Visit: Payer: Self-pay | Admitting: Cardiology

## 2022-03-22 DIAGNOSIS — J449 Chronic obstructive pulmonary disease, unspecified: Secondary | ICD-10-CM | POA: Diagnosis not present

## 2022-03-22 DIAGNOSIS — J411 Mucopurulent chronic bronchitis: Secondary | ICD-10-CM | POA: Diagnosis not present

## 2022-04-15 DIAGNOSIS — R0602 Shortness of breath: Secondary | ICD-10-CM | POA: Diagnosis not present

## 2022-04-15 DIAGNOSIS — R231 Pallor: Secondary | ICD-10-CM | POA: Diagnosis not present

## 2022-04-15 DIAGNOSIS — J9601 Acute respiratory failure with hypoxia: Secondary | ICD-10-CM | POA: Diagnosis not present

## 2022-04-15 DIAGNOSIS — Z91012 Allergy to eggs: Secondary | ICD-10-CM | POA: Diagnosis not present

## 2022-04-15 DIAGNOSIS — Z792 Long term (current) use of antibiotics: Secondary | ICD-10-CM | POA: Diagnosis not present

## 2022-04-15 DIAGNOSIS — R7303 Prediabetes: Secondary | ICD-10-CM | POA: Diagnosis not present

## 2022-04-15 DIAGNOSIS — E1165 Type 2 diabetes mellitus with hyperglycemia: Secondary | ICD-10-CM | POA: Diagnosis not present

## 2022-04-15 DIAGNOSIS — I10 Essential (primary) hypertension: Secondary | ICD-10-CM | POA: Diagnosis not present

## 2022-04-15 DIAGNOSIS — J1282 Pneumonia due to coronavirus disease 2019: Secondary | ICD-10-CM | POA: Diagnosis not present

## 2022-04-15 DIAGNOSIS — A4189 Other specified sepsis: Secondary | ICD-10-CM | POA: Diagnosis not present

## 2022-04-15 DIAGNOSIS — J439 Emphysema, unspecified: Secondary | ICD-10-CM | POA: Diagnosis not present

## 2022-04-15 DIAGNOSIS — I2510 Atherosclerotic heart disease of native coronary artery without angina pectoris: Secondary | ICD-10-CM | POA: Diagnosis not present

## 2022-04-15 DIAGNOSIS — U071 COVID-19: Secondary | ICD-10-CM | POA: Diagnosis not present

## 2022-04-15 DIAGNOSIS — R6889 Other general symptoms and signs: Secondary | ICD-10-CM | POA: Diagnosis not present

## 2022-04-15 DIAGNOSIS — J9611 Chronic respiratory failure with hypoxia: Secondary | ICD-10-CM | POA: Diagnosis not present

## 2022-04-15 DIAGNOSIS — Z888 Allergy status to other drugs, medicaments and biological substances status: Secondary | ICD-10-CM | POA: Diagnosis not present

## 2022-04-15 DIAGNOSIS — Z7901 Long term (current) use of anticoagulants: Secondary | ICD-10-CM | POA: Diagnosis not present

## 2022-04-15 DIAGNOSIS — Z7952 Long term (current) use of systemic steroids: Secondary | ICD-10-CM | POA: Diagnosis not present

## 2022-04-15 DIAGNOSIS — R918 Other nonspecific abnormal finding of lung field: Secondary | ICD-10-CM | POA: Diagnosis not present

## 2022-04-15 DIAGNOSIS — Z880 Allergy status to penicillin: Secondary | ICD-10-CM | POA: Diagnosis not present

## 2022-04-15 DIAGNOSIS — R509 Fever, unspecified: Secondary | ICD-10-CM | POA: Diagnosis not present

## 2022-04-15 DIAGNOSIS — R062 Wheezing: Secondary | ICD-10-CM | POA: Diagnosis not present

## 2022-04-15 DIAGNOSIS — J440 Chronic obstructive pulmonary disease with acute lower respiratory infection: Secondary | ICD-10-CM | POA: Diagnosis not present

## 2022-04-15 DIAGNOSIS — Z743 Need for continuous supervision: Secondary | ICD-10-CM | POA: Diagnosis not present

## 2022-04-15 DIAGNOSIS — Z9981 Dependence on supplemental oxygen: Secondary | ICD-10-CM | POA: Diagnosis not present

## 2022-04-15 DIAGNOSIS — Z87891 Personal history of nicotine dependence: Secondary | ICD-10-CM | POA: Diagnosis not present

## 2022-04-15 DIAGNOSIS — J441 Chronic obstructive pulmonary disease with (acute) exacerbation: Secondary | ICD-10-CM | POA: Diagnosis not present

## 2022-04-15 DIAGNOSIS — R051 Acute cough: Secondary | ICD-10-CM | POA: Diagnosis not present

## 2022-04-15 DIAGNOSIS — E119 Type 2 diabetes mellitus without complications: Secondary | ICD-10-CM | POA: Diagnosis not present

## 2022-04-15 DIAGNOSIS — R6520 Severe sepsis without septic shock: Secondary | ICD-10-CM | POA: Diagnosis not present

## 2022-04-15 DIAGNOSIS — Z79899 Other long term (current) drug therapy: Secondary | ICD-10-CM | POA: Diagnosis not present

## 2022-04-21 ENCOUNTER — Other Ambulatory Visit: Payer: Self-pay | Admitting: Cardiology

## 2022-04-22 DIAGNOSIS — J449 Chronic obstructive pulmonary disease, unspecified: Secondary | ICD-10-CM | POA: Diagnosis not present

## 2022-04-22 DIAGNOSIS — J411 Mucopurulent chronic bronchitis: Secondary | ICD-10-CM | POA: Diagnosis not present

## 2022-04-28 DIAGNOSIS — Z23 Encounter for immunization: Secondary | ICD-10-CM | POA: Diagnosis not present

## 2022-05-12 DIAGNOSIS — H00014 Hordeolum externum left upper eyelid: Secondary | ICD-10-CM | POA: Diagnosis not present

## 2022-05-19 DIAGNOSIS — U071 COVID-19: Secondary | ICD-10-CM | POA: Diagnosis not present

## 2022-05-19 DIAGNOSIS — Z23 Encounter for immunization: Secondary | ICD-10-CM | POA: Diagnosis not present

## 2022-05-22 DIAGNOSIS — J411 Mucopurulent chronic bronchitis: Secondary | ICD-10-CM | POA: Diagnosis not present

## 2022-05-22 DIAGNOSIS — J449 Chronic obstructive pulmonary disease, unspecified: Secondary | ICD-10-CM | POA: Diagnosis not present

## 2022-05-25 DIAGNOSIS — R197 Diarrhea, unspecified: Secondary | ICD-10-CM | POA: Diagnosis not present

## 2022-05-25 DIAGNOSIS — R52 Pain, unspecified: Secondary | ICD-10-CM | POA: Diagnosis not present

## 2022-05-25 DIAGNOSIS — R1084 Generalized abdominal pain: Secondary | ICD-10-CM | POA: Diagnosis not present

## 2022-05-27 DIAGNOSIS — Z79899 Other long term (current) drug therapy: Secondary | ICD-10-CM | POA: Diagnosis not present

## 2022-05-27 DIAGNOSIS — Z743 Need for continuous supervision: Secondary | ICD-10-CM | POA: Diagnosis not present

## 2022-05-27 DIAGNOSIS — R0689 Other abnormalities of breathing: Secondary | ICD-10-CM | POA: Diagnosis not present

## 2022-05-27 DIAGNOSIS — R109 Unspecified abdominal pain: Secondary | ICD-10-CM | POA: Diagnosis not present

## 2022-05-27 DIAGNOSIS — K2970 Gastritis, unspecified, without bleeding: Secondary | ICD-10-CM | POA: Diagnosis not present

## 2022-05-27 DIAGNOSIS — Z888 Allergy status to other drugs, medicaments and biological substances status: Secondary | ICD-10-CM | POA: Diagnosis not present

## 2022-05-27 DIAGNOSIS — Z20822 Contact with and (suspected) exposure to covid-19: Secondary | ICD-10-CM | POA: Diagnosis not present

## 2022-05-27 DIAGNOSIS — N281 Cyst of kidney, acquired: Secondary | ICD-10-CM | POA: Diagnosis not present

## 2022-05-27 DIAGNOSIS — K219 Gastro-esophageal reflux disease without esophagitis: Secondary | ICD-10-CM | POA: Diagnosis not present

## 2022-05-27 DIAGNOSIS — I10 Essential (primary) hypertension: Secondary | ICD-10-CM | POA: Diagnosis not present

## 2022-05-27 DIAGNOSIS — Z880 Allergy status to penicillin: Secondary | ICD-10-CM | POA: Diagnosis not present

## 2022-05-27 DIAGNOSIS — N3289 Other specified disorders of bladder: Secondary | ICD-10-CM | POA: Diagnosis not present

## 2022-05-27 DIAGNOSIS — R1011 Right upper quadrant pain: Secondary | ICD-10-CM | POA: Diagnosis not present

## 2022-05-27 DIAGNOSIS — R6889 Other general symptoms and signs: Secondary | ICD-10-CM | POA: Diagnosis not present

## 2022-05-27 DIAGNOSIS — R1013 Epigastric pain: Secondary | ICD-10-CM | POA: Diagnosis not present

## 2022-06-06 DIAGNOSIS — R5381 Other malaise: Secondary | ICD-10-CM | POA: Diagnosis not present

## 2022-06-06 DIAGNOSIS — I10 Essential (primary) hypertension: Secondary | ICD-10-CM | POA: Diagnosis not present

## 2022-06-06 DIAGNOSIS — R110 Nausea: Secondary | ICD-10-CM | POA: Diagnosis not present

## 2022-06-22 ENCOUNTER — Other Ambulatory Visit: Payer: Self-pay | Admitting: Pulmonary Disease

## 2022-06-22 DIAGNOSIS — J449 Chronic obstructive pulmonary disease, unspecified: Secondary | ICD-10-CM | POA: Diagnosis not present

## 2022-06-22 DIAGNOSIS — J411 Mucopurulent chronic bronchitis: Secondary | ICD-10-CM | POA: Diagnosis not present

## 2022-06-25 DIAGNOSIS — Z880 Allergy status to penicillin: Secondary | ICD-10-CM | POA: Diagnosis not present

## 2022-06-25 DIAGNOSIS — Z91012 Allergy to eggs: Secondary | ICD-10-CM | POA: Diagnosis not present

## 2022-06-25 DIAGNOSIS — R1031 Right lower quadrant pain: Secondary | ICD-10-CM | POA: Diagnosis not present

## 2022-06-25 DIAGNOSIS — Z79899 Other long term (current) drug therapy: Secondary | ICD-10-CM | POA: Diagnosis not present

## 2022-06-25 DIAGNOSIS — Z743 Need for continuous supervision: Secondary | ICD-10-CM | POA: Diagnosis not present

## 2022-06-25 DIAGNOSIS — R109 Unspecified abdominal pain: Secondary | ICD-10-CM | POA: Diagnosis not present

## 2022-06-25 DIAGNOSIS — Z888 Allergy status to other drugs, medicaments and biological substances status: Secondary | ICD-10-CM | POA: Diagnosis not present

## 2022-06-25 DIAGNOSIS — R197 Diarrhea, unspecified: Secondary | ICD-10-CM | POA: Diagnosis not present

## 2022-06-25 DIAGNOSIS — F32A Depression, unspecified: Secondary | ICD-10-CM | POA: Diagnosis not present

## 2022-06-25 DIAGNOSIS — R6889 Other general symptoms and signs: Secondary | ICD-10-CM | POA: Diagnosis not present

## 2022-06-25 DIAGNOSIS — R300 Dysuria: Secondary | ICD-10-CM | POA: Diagnosis not present

## 2022-06-25 DIAGNOSIS — Z91040 Latex allergy status: Secondary | ICD-10-CM | POA: Diagnosis not present

## 2022-06-25 DIAGNOSIS — Z7951 Long term (current) use of inhaled steroids: Secondary | ICD-10-CM | POA: Diagnosis not present

## 2022-06-25 DIAGNOSIS — Z91030 Bee allergy status: Secondary | ICD-10-CM | POA: Diagnosis not present

## 2022-06-25 DIAGNOSIS — K219 Gastro-esophageal reflux disease without esophagitis: Secondary | ICD-10-CM | POA: Diagnosis not present

## 2022-06-25 DIAGNOSIS — I10 Essential (primary) hypertension: Secondary | ICD-10-CM | POA: Diagnosis not present

## 2022-06-28 DIAGNOSIS — R1084 Generalized abdominal pain: Secondary | ICD-10-CM | POA: Diagnosis not present

## 2022-06-28 DIAGNOSIS — Z743 Need for continuous supervision: Secondary | ICD-10-CM | POA: Diagnosis not present

## 2022-06-28 DIAGNOSIS — I10 Essential (primary) hypertension: Secondary | ICD-10-CM | POA: Diagnosis not present

## 2022-06-28 DIAGNOSIS — R109 Unspecified abdominal pain: Secondary | ICD-10-CM | POA: Diagnosis not present

## 2022-06-28 DIAGNOSIS — R110 Nausea: Secondary | ICD-10-CM | POA: Diagnosis not present

## 2022-06-28 DIAGNOSIS — F32A Depression, unspecified: Secondary | ICD-10-CM | POA: Diagnosis not present

## 2022-06-28 DIAGNOSIS — Z91012 Allergy to eggs: Secondary | ICD-10-CM | POA: Diagnosis not present

## 2022-06-28 DIAGNOSIS — R6889 Other general symptoms and signs: Secondary | ICD-10-CM | POA: Diagnosis not present

## 2022-06-28 DIAGNOSIS — Z880 Allergy status to penicillin: Secondary | ICD-10-CM | POA: Diagnosis not present

## 2022-06-28 DIAGNOSIS — Z888 Allergy status to other drugs, medicaments and biological substances status: Secondary | ICD-10-CM | POA: Diagnosis not present

## 2022-06-30 DIAGNOSIS — R1011 Right upper quadrant pain: Secondary | ICD-10-CM | POA: Diagnosis not present

## 2022-07-01 ENCOUNTER — Ambulatory Visit: Payer: Medicare Other | Attending: Nurse Practitioner | Admitting: Nurse Practitioner

## 2022-07-01 ENCOUNTER — Encounter: Payer: Self-pay | Admitting: Nurse Practitioner

## 2022-07-01 VITALS — BP 130/70 | HR 69 | Ht 69.0 in | Wt 309.0 lb

## 2022-07-01 DIAGNOSIS — J449 Chronic obstructive pulmonary disease, unspecified: Secondary | ICD-10-CM

## 2022-07-01 DIAGNOSIS — I5032 Chronic diastolic (congestive) heart failure: Secondary | ICD-10-CM

## 2022-07-01 DIAGNOSIS — E785 Hyperlipidemia, unspecified: Secondary | ICD-10-CM | POA: Diagnosis not present

## 2022-07-01 DIAGNOSIS — R011 Cardiac murmur, unspecified: Secondary | ICD-10-CM

## 2022-07-01 DIAGNOSIS — R0789 Other chest pain: Secondary | ICD-10-CM | POA: Diagnosis not present

## 2022-07-01 DIAGNOSIS — I10 Essential (primary) hypertension: Secondary | ICD-10-CM | POA: Diagnosis not present

## 2022-07-01 NOTE — Progress Notes (Signed)
Cardiology Office Note:    Date:  07/02/2022   ID:  Charles Mathews, DOB [birth year], MRN [id number]  PCP:  Garnette Gunner, MD   [facility] Providers Cardiologist:  Nona Dell, MD     Referring MD: Garnette Gunner, MD   CC: Here for follow-up  History of Present Illness:    Charles Mathews is a 61 y.o. male with a hx of the following:  Chronic diastolic CHF HTN Obesity Hx of chest pain COPD  Patient is a 61 year old male with past medical history as mentioned above.  Echocardiogram in 2022 revealed EF 55 to 60%, no RWMA, mild LVH, no MR, overall no significant valvular dysfunction.  Myoview in 2022 was low risk, normal study.  Last seen by Dr. Diona Browner on March 22, 2021.  Previously saw Dr. Purvis Sheffield sworn and was transitioning to care with Dr. Diona Browner.  He was overall doing well from a cardiac perspective.  He was being followed by [place] [facility], was wearing supplemental oxygen typically, but did not have it at office visit that day.  No changes were made to his medications and no testing was ordered.  He was told to follow-up in 6 months.  Since last office visit, he has had several ED visits within the past year.  Most significantly he presented to [facility] ED on October 08, 2021 with chief complaint of syncope, passed out at dinner, also was complaining of diarrhea and shortness of breath.  Stated he had passed out while at out at [employer].  He had an additional syncopal episode in the bathroom while in the waiting room.  Did also no abdominal pain, was noting shortness of breath and was noted to wear 2 to 3 L of oxygen continuously due to history of COPD, however he was without oxygen that day.  Workup in ED was overall unremarkable.  Patient's vomiting and diarrhea was thought to be due to a viral gastroenteritis.  CT of abdomen and pelvis was unremarkable, nothing acute.  He denied any cardiac complaints surrounding his syncopal episode.  No  evidence of seizures surrounding event. Was d/c in stable condition and instructed to closely f/u with PCP.   Most recently, he presented to the ED on June 28, 2022 with chief complaint of generalized abdominal pain, nausea.  Complained of burning with urination as well.  Reported history of kidney stones, denied fevers, chills, nausea or vomiting.  He was seen prior to this visit in the ED 3 days ago with exact same symptoms.  Denied any chest pain or shortness of breath.  Overall labs are unremarkable.  He had a total of 4 CT scans of his abdomen and pelvis in 2023 that were all unremarkable.  Urinalysis from 3 days prior was unremarkable.  Was discharged in stable condition that day.  Today he presents for overdue follow-up visit with his CMA. CC today is chest pain, located in the center of the chest, non-radiating, most noticeable with swallowing, chronic and stable per his report. Describes pain as "hard." Pain is relieved when resting and made worse with picking up heavy objects. Says he has a history of hiatal hernia, so he is unsure if pain is coming from his heart or his hiatal hernia. Sees a GI doctor. Some chronic, stable dyspnea with exertion. Has hx of COPD and presents to the clinic today without O2 therapy. Denies any palpitations, syncope, presyncope, dizziness, orthopnea, PND, swelling, significant weight changes, acute bleeding, or claudication. Denies  any other questions or concerns.   Past Medical History:  Diagnosis Date   Anxiety    Arthritis    Asthma    Bipolar disorder (HCC)    Chronic back pain    COPD (chronic obstructive pulmonary disease) (HCC)    Essential hypertension    Gastric ulcer    GERD (gastroesophageal reflux disease)    Head injury    History of kidney stones    Insomnia    Major depression    Mentally challenged    Migraine    Mixed hyperlipidemia    Morbid obesity (HCC)    Neuropathy    Pre-diabetes    Pulmonary hamartoma (HCC)    Seizures  (HCC)     10/27/20- last one age [age]   Sleep apnea    Urinary urgency     Past Surgical History:  Procedure Laterality Date   ARTERY BIOPSY N/A 10/28/2020   Procedure: BIOPSY TEMPORAL ARTERY;  Surgeon: Larina Earthly, MD;  Location: [facility] OR;  Service: Vascular;  Laterality: N/A;   BIOPSY  07/06/2021   Procedure: BIOPSY;  Surgeon: Dolores Frame, MD;  Location: [facility] SUITE;  Service: Gastroenterology;;   COLONOSCOPY WITH PROPOFOL N/A 07/06/2021   Procedure: COLONOSCOPY WITH PROPOFOL;  Surgeon: Dolores Frame, MD;  Location: [facility];  Service: Gastroenterology;  Laterality: N/A;  10:15   CYSTOSCOPY WITH RETROGRADE PYELOGRAM, URETEROSCOPY AND STENT PLACEMENT Left 11/30/2020   Procedure: CYSTOSCOPY WITH LEFT RETROGRADE PYELOGRAM, LEFT URETEROSCOPY AND LEFT STENT PLACEMENT;  Surgeon: Malen Gauze, MD;  Location: [facility] ORS;  Service: Urology;  Laterality: Left;   ESOPHAGOGASTRODUODENOSCOPY (EGD) WITH PROPOFOL N/A 07/06/2021   Procedure: ESOPHAGOGASTRODUODENOSCOPY (EGD) WITH PROPOFOL;  Surgeon: Dolores Frame, MD;  Location: [facility];  Service: Gastroenterology;  Laterality: N/A;   LITHOTRIPSY  2009   MULTIPLE EXTRACTIONS WITH ALVEOLOPLASTY N/A 12/17/2012   Procedure: MULTIPLE EXTRACTION WITH ALVEOLOPLASTY;  Surgeon: Georgia Lopes, DDS;  Location: [facility] OR;  Service: Oral Surgery;  Laterality: N/A;   POLYPECTOMY  07/06/2021   Procedure: POLYPECTOMY;  Surgeon: Dolores Frame, MD;  Location: [facility];  Service: Gastroenterology;;   Gaspar Bidding DILATION  07/06/2021   Procedure: Gaspar Bidding DILATION;  Surgeon: Marguerita Merles, Reuel Boom, MD;  Location: [facility];  Service: Gastroenterology;;    Current Medications: Current Meds  Medication Sig   ACCU-CHEK GUIDE test strip USE ONCE DAILY TO TEST SUGAR   Accu-Chek Softclix Lancets lancets USE TO TEST ONCE DAILY   acetaminophen (TYLENOL) 650 MG CR tablet Take 1,300 mg by mouth every 8 (eight) hours as  needed for pain.   albuterol (VENTOLIN HFA) 108 (90 Base) MCG/ACT inhaler Inhale 1 puff into the lungs every 6 (six) hours as needed for wheezing or shortness of breath.   aspirin EC 81 MG tablet Take 81 mg by mouth daily.   benztropine (COGENTIN) 1 MG tablet Take 1 mg by mouth daily.   Blood Glucose Monitoring Suppl (ACCU-CHEK GUIDE) w/Device KIT USE ONCE DAILY TO TEST SUAGR   Budeson-Glycopyrrol-Formoterol (BREZTRI AEROSPHERE) 160-9-4.8 MCG/ACT AERO Inhale 2 puffs into the lungs in the morning and at bedtime.   candesartan (ATACAND) 16 MG tablet Take 8 mg by mouth daily.   Carboxymethylcellul-Glycerin (CLEAR EYES FOR DRY EYES) 1-0.25 % SOLN Place 1 drop into both eyes daily as needed (dry eyes).   cetirizine (ZYRTEC) 10 MG tablet Take 10 mg by mouth daily.   dicyclomine (BENTYL) 10 MG capsule Take 1 capsule (10 mg total)  by mouth 3 (three) times daily as needed for spasms.   doxazosin (CARDURA) 2 MG tablet TAKE 1 TABLET BY MOUTH DAILY - MUST SCHEDULE APPOINTMENT FOR MORE REFILLS   EMGALITY 120 MG/ML SOAJ INJECT 120 MG into THE SKIN EVERY 28 DAYS   finasteride (PROSCAR) 5 MG tablet Take 5 mg by mouth daily.   fluticasone (FLONASE) 50 MCG/ACT nasal spray INSTILL ONE SPRAY INTO BOTH NOSTRILS DAILY   furosemide (LASIX) 40 MG tablet TAKE 1 TABLET BY MOUTH DAILY   gabapentin (NEURONTIN) 100 MG capsule Take 100 mg by mouth at bedtime.   meclizine (ANTIVERT) 25 MG tablet Take 12.5 mg by mouth daily.   Melatonin 10 MG TABS Take 10 mg by mouth at bedtime.   metFORMIN (GLUCOPHAGE) 500 MG tablet Take 500 mg by mouth daily.   metoprolol tartrate (LOPRESSOR) 50 MG tablet TAKE 1 TABLET BY MOUTH TWICE DAILY   Multiple Vitamin (MULTIVITAMIN) tablet Take 2 tablets by mouth daily. Gummies   omeprazole (PRILOSEC) 20 MG capsule Take 1 capsule (20 mg total) by mouth daily.   ondansetron (ZOFRAN ODT) 4 MG disintegrating tablet Take 1 tablet (4 mg total) by mouth every 8 (eight) hours as needed for nausea or  vomiting.   paliperidone (INVEGA) 3 MG 24 hr tablet Take 3 mg by mouth daily.   phenazopyridine (PYRIDIUM) 100 MG tablet Take 1 tablet (100 mg total) by mouth 3 (three) times daily as needed for pain.   potassium chloride SA (KLOR-CON M) 20 MEQ tablet TAKE 1 TABLET BY MOUTH DAILY   Semaglutide,0.25 or 0.5MG /DOS, (OZEMPIC, 0.25 OR 0.5 MG/DOSE,) 2 MG/1.5ML SOPN Inject 0.25 mg into the skin every Friday.   sertraline (ZOLOFT) 100 MG tablet Take 100 mg by mouth at bedtime.   tamsulosin (FLOMAX) 0.4 MG CAPS capsule Take 0.4 mg by mouth daily.   traMADol (ULTRAM) 50 MG tablet Take 50 mg by mouth every 6 (six) hours as needed for severe pain.   traZODone (DESYREL) 100 MG tablet Take 100 mg by mouth at bedtime.     Allergies:   Bee venom, Benadryl [diphenhydramine], Lorazepam, Penicillins, and Latex   Social History   Socioeconomic History   Marital status: Single    Spouse name: Not on file   Number of children: Not on file   Years of education: Not on file   Highest education level: Not on file  Occupational History   Not on file  Tobacco Use   Smoking status: Former    Packs/day: 2.00    Years: 27.00    Total pack years: 54.00    Types: Cigarettes    Quit date: 08/01/2002    Years since quitting: 19.9    Passive exposure: Past   Smokeless tobacco: Never   Tobacco comments:    Patient states that he never was an everyday smoker only tried it when he was 14  Vaping Use   Vaping Use: Never used  Substance and Sexual Activity   Alcohol use: No   Drug use: No   Sexual activity: Never  Other Topics Concern   Not on file  Social History Narrative   Left handed   Social Determinants of Health   Financial Resource Strain: Not on file  Food Insecurity: Not on file  Transportation Needs: Not on file  Physical Activity: Not on file  Stress: Not on file  Social Connections: Not on file     ROS:   Review of Systems  Constitutional:  Positive for malaise/fatigue. Negative  for  chills, diaphoresis, fever and weight loss.  HENT: Negative.    Eyes: Negative.   Respiratory:  Positive for shortness of breath. Negative for cough, hemoptysis, sputum production and wheezing.   Cardiovascular:  Positive for chest pain. Negative for palpitations, orthopnea, claudication, leg swelling and PND.  Gastrointestinal:  Positive for heartburn. Negative for abdominal pain, blood in stool, constipation, diarrhea, nausea and vomiting.  Genitourinary: Negative.   Musculoskeletal: Negative.   Skin: Negative.   Neurological: Negative.   Endo/Heme/Allergies: Negative.   Psychiatric/Behavioral: Negative.      Please see the history of present illness.    All other systems reviewed and are negative.  EKGs/Labs/Other Studies Reviewed:    The following studies were reviewed today:   EKG:  EKG is ordered today.  The ekg ordered today demonstrates NSR, 65 bpm, otherwise nothing acute.   Myoview on 09/01/2020: No diagnostic ST segment changes to indicate ischemia. Small, mild intensity, apical anteroseptal and basal inferolateral defects that are fixed and most consistent with soft tissue attenuation. This is a low risk study. Nuclear stress EF: 52%.   2D Echo on 08/28/2020:  1. Left ventricular ejection fraction, by estimation, is 55 to 60%. The  left ventricle has normal function. The left ventricle has no regional  wall motion abnormalities. There is mild left ventricular hypertrophy.  Left ventricular diastolic parameters  were normal.   2. Right ventricular systolic function is normal. The right ventricular  size is normal.   3. The mitral valve is normal in structure. No evidence of mitral valve  regurgitation. No evidence of mitral stenosis.   4. The aortic valve is tricuspid. Aortic valve regurgitation is not  visualized. No aortic stenosis is present.   Recent Labs: No results found for requested labs within last 365 days.  Recent Lipid Panel    Component Value  Date/Time   CHOL 135 04/02/2011 0550   TRIG 84 04/02/2011 0550   HDL 40 04/02/2011 0550   CHOLHDL 3.4 04/02/2011 0550   VLDL 17 04/02/2011 0550   LDLCALC 78 04/02/2011 0550    Physical Exam:    VS:  BP 130/70 (BP Location: Right Arm, Patient Position: Sitting, Cuff Size: Large)   Pulse 69   Ht 5\' 9"  (1.753 m)   Wt (!) 309 lb (140.2 kg)   SpO2 92%   BMI 45.63 kg/m     Wt Readings from Last 3 Encounters:  07/01/22 (!) 309 lb (140.2 kg)  07/06/21 (!) 328 lb 7.8 oz (149 kg)  07/01/21 (!) 328 lb (148.8 kg)     GEN: Morbidly obese, 61 y.o. male in no acute distress HEENT: Normal NECK: No JVD; No carotid bruits CARDIAC: S1/S2, RRR, Grade 2/6 systolic murmur along LSB, no rubs or gallops; 2+ femoral pulses throughout, strong and equal bilaterally RESPIRATORY:  Clear and diminished to auscultation without rales, wheezing or rhonchi  ABDOMEN: Soft, non-tender, non-distended MUSCULOSKELETAL:  No edema; No deformity  SKIN: Warm and dry NEUROLOGIC:  Alert and oriented x 3 PSYCHIATRIC:  Normal affect   ASSESSMENT:    1. Atypical chest pain   2. Hypertension, unspecified type   3. Chronic diastolic heart failure (HCC)   4. Heart murmur   5. Chronic obstructive pulmonary disease, unspecified COPD type (HCC)   6. Hyperlipidemia, unspecified hyperlipidemia type   7. Morbid obesity (HCC)    PLAN:    In order of problems listed above:  Atypical chest pain We reviewed last Myoview was normal and low risk  and how etiology does not sound cardiac and sounds most likely GI. Continue current medication regimen. Discussed heart healthy diet and regular cardiovascular exercise encouraged. Continue to follow up with GI.   HTN BP 130/70. BP well controlled at home. Continue current medication regimen. Discussed to monitor BP at home at least 2 hours after medications and sitting for 5-10 minutes. Heart healthy diet and regular cardiovascular exercise encouraged.   Chronic diastolic CHF,  heart murmur Last TTE 08/2020 revealed EF 55-60%, does admit to some DOE, which could be due to COPD and deconditioning, however will repeat Echo at this time to evaluate EF and valvular function, Grade 2/6 systolic murmur noted at LSB on exam. Does not appear volume up on exam. Continue lasix and potassium. Low sodium diet, fluid restriction <2L, and daily weights encouraged. Educated to contact our office for weight gain of 2 lbs overnight or 5 lbs in one week. Heart healthy diet and regular cardiovascular exercise encouraged.   4. COPD Denies any recent exacerbations. Continue to f/u with pulmonology. Continue current medication regimen.   5. HLD Labs from June 2023 revealed total cholesterol 131, HDL 45, LDL 65, and triglycerides 540.  Continue rosuvastatin. Heart healthy diet and regular cardiovascular exercise encouraged.    6. Morbid obesity BMI today 45.63. Weight loss via diet and exercise encouraged. Discussed the impact being overweight would have on cardiovascular risk. Heart healthy diet and regular cardiovascular exercise encouraged.     7. Disposition: Follow-up with me or Dr. Diona Browner in 2 months or sooner if anything changes.   Medication Adjustments/Labs and Tests Ordered: Current medicines are reviewed at length with the patient today.  Concerns regarding medicines are outlined above.  Orders Placed This Encounter  Procedures   EKG 12-Lead   ECHOCARDIOGRAM COMPLETE   No orders of the defined types were placed in this encounter.   Patient Instructions  Medication Instructions:  Your physician recommends that you continue on your current medications as directed. Please refer to the Current Medication list given to you today.   Labwork: none  Testing/Procedures: Your physician has requested that you have an echocardiogram. Echocardiography is a painless test that uses sound waves to create images of your heart. It provides your doctor with information about the size  and shape of your heart and how well your heart's chambers and valves are working. This procedure takes approximately one hour. There are no restrictions for this procedure. Please do NOT wear cologne, perfume, aftershave, or lotions (deodorant is allowed). Please arrive 15 minutes prior to your appointment time.   Follow-Up:  Your physician recommends that you schedule a follow-up appointment in: 2 months  Any Other Special Instructions Will Be Listed Below (If Applicable).  If you need a refill on your cardiac medications before your next appointment, please call your pharmacy.    Signed, Sharlene Dory, NP  07/02/2022 1:58 PM    [facility]

## 2022-07-01 NOTE — Patient Instructions (Signed)
Medication Instructions:  Your physician recommends that you continue on your current medications as directed. Please refer to the Current Medication list given to you today.   Labwork: none  Testing/Procedures: Your physician has requested that you have an echocardiogram. Echocardiography is a painless test that uses sound waves to create images of your heart. It provides your doctor with information about the size and shape of your heart and how well your heart's chambers and valves are working. This procedure takes approximately one hour. There are no restrictions for this procedure. Please do NOT wear cologne, perfume, aftershave, or lotions (deodorant is allowed). Please arrive 15 minutes prior to your appointment time.   Follow-Up:  Your physician recommends that you schedule a follow-up appointment in: 2 months  Any Other Special Instructions Will Be Listed Below (If Applicable).  If you need a refill on your cardiac medications before your next appointment, please call your pharmacy.

## 2022-07-04 DIAGNOSIS — J449 Chronic obstructive pulmonary disease, unspecified: Secondary | ICD-10-CM | POA: Diagnosis not present

## 2022-07-04 DIAGNOSIS — Z0001 Encounter for general adult medical examination with abnormal findings: Secondary | ICD-10-CM | POA: Diagnosis not present

## 2022-07-04 DIAGNOSIS — Z23 Encounter for immunization: Secondary | ICD-10-CM | POA: Diagnosis not present

## 2022-07-04 DIAGNOSIS — K2100 Gastro-esophageal reflux disease with esophagitis, without bleeding: Secondary | ICD-10-CM | POA: Diagnosis not present

## 2022-07-04 DIAGNOSIS — J9611 Chronic respiratory failure with hypoxia: Secondary | ICD-10-CM | POA: Diagnosis not present

## 2022-07-04 DIAGNOSIS — E1165 Type 2 diabetes mellitus with hyperglycemia: Secondary | ICD-10-CM | POA: Diagnosis not present

## 2022-07-04 DIAGNOSIS — E876 Hypokalemia: Secondary | ICD-10-CM | POA: Diagnosis not present

## 2022-07-04 DIAGNOSIS — I10 Essential (primary) hypertension: Secondary | ICD-10-CM | POA: Diagnosis not present

## 2022-07-04 DIAGNOSIS — E7849 Other hyperlipidemia: Secondary | ICD-10-CM | POA: Diagnosis not present

## 2022-07-04 DIAGNOSIS — G4733 Obstructive sleep apnea (adult) (pediatric): Secondary | ICD-10-CM | POA: Diagnosis not present

## 2022-07-05 DIAGNOSIS — K7689 Other specified diseases of liver: Secondary | ICD-10-CM | POA: Diagnosis not present

## 2022-07-05 DIAGNOSIS — R1011 Right upper quadrant pain: Secondary | ICD-10-CM | POA: Diagnosis not present

## 2022-07-13 ENCOUNTER — Other Ambulatory Visit: Payer: Medicare Other

## 2022-07-14 ENCOUNTER — Ambulatory Visit: Payer: Medicare Other | Attending: Nurse Practitioner

## 2022-07-14 DIAGNOSIS — R011 Cardiac murmur, unspecified: Secondary | ICD-10-CM

## 2022-07-14 LAB — ECHOCARDIOGRAM COMPLETE
AR max vel: 2.53 cm2
AV Peak grad: 12.4 mmHg
Ao pk vel: 1.76 m/s
Area-P 1/2: 2.87 cm2
Calc EF: 57.2 %
MV M vel: 3.21 m/s
MV Peak grad: 41.2 mmHg
S' Lateral: 2.7 cm
Single Plane A2C EF: 54 %
Single Plane A4C EF: 60.6 %

## 2022-07-16 DIAGNOSIS — R109 Unspecified abdominal pain: Secondary | ICD-10-CM | POA: Diagnosis not present

## 2022-07-16 DIAGNOSIS — R918 Other nonspecific abnormal finding of lung field: Secondary | ICD-10-CM | POA: Diagnosis not present

## 2022-07-16 DIAGNOSIS — I10 Essential (primary) hypertension: Secondary | ICD-10-CM | POA: Diagnosis not present

## 2022-07-16 DIAGNOSIS — Z888 Allergy status to other drugs, medicaments and biological substances status: Secondary | ICD-10-CM | POA: Diagnosis not present

## 2022-07-16 DIAGNOSIS — Z91012 Allergy to eggs: Secondary | ICD-10-CM | POA: Diagnosis not present

## 2022-07-16 DIAGNOSIS — Z91040 Latex allergy status: Secondary | ICD-10-CM | POA: Diagnosis not present

## 2022-07-16 DIAGNOSIS — Q859 Phakomatosis, unspecified: Secondary | ICD-10-CM | POA: Diagnosis not present

## 2022-07-16 DIAGNOSIS — Z880 Allergy status to penicillin: Secondary | ICD-10-CM | POA: Diagnosis not present

## 2022-07-16 DIAGNOSIS — Z79899 Other long term (current) drug therapy: Secondary | ICD-10-CM | POA: Diagnosis not present

## 2022-07-19 DIAGNOSIS — Z91040 Latex allergy status: Secondary | ICD-10-CM | POA: Diagnosis not present

## 2022-07-19 DIAGNOSIS — R059 Cough, unspecified: Secondary | ICD-10-CM | POA: Diagnosis not present

## 2022-07-19 DIAGNOSIS — Z87891 Personal history of nicotine dependence: Secondary | ICD-10-CM | POA: Diagnosis not present

## 2022-07-19 DIAGNOSIS — R6889 Other general symptoms and signs: Secondary | ICD-10-CM | POA: Diagnosis not present

## 2022-07-19 DIAGNOSIS — R109 Unspecified abdominal pain: Secondary | ICD-10-CM | POA: Diagnosis not present

## 2022-07-19 DIAGNOSIS — R0602 Shortness of breath: Secondary | ICD-10-CM | POA: Diagnosis not present

## 2022-07-19 DIAGNOSIS — Z743 Need for continuous supervision: Secondary | ICD-10-CM | POA: Diagnosis not present

## 2022-07-19 DIAGNOSIS — Z888 Allergy status to other drugs, medicaments and biological substances status: Secondary | ICD-10-CM | POA: Diagnosis not present

## 2022-07-19 DIAGNOSIS — R2991 Unspecified symptoms and signs involving the musculoskeletal system: Secondary | ICD-10-CM | POA: Diagnosis not present

## 2022-07-19 DIAGNOSIS — K219 Gastro-esophageal reflux disease without esophagitis: Secondary | ICD-10-CM | POA: Diagnosis not present

## 2022-07-19 DIAGNOSIS — Z91012 Allergy to eggs: Secondary | ICD-10-CM | POA: Diagnosis not present

## 2022-07-19 DIAGNOSIS — I10 Essential (primary) hypertension: Secondary | ICD-10-CM | POA: Diagnosis not present

## 2022-07-19 DIAGNOSIS — J45901 Unspecified asthma with (acute) exacerbation: Secondary | ICD-10-CM | POA: Diagnosis not present

## 2022-07-19 DIAGNOSIS — Z880 Allergy status to penicillin: Secondary | ICD-10-CM | POA: Diagnosis not present

## 2022-07-19 DIAGNOSIS — M791 Myalgia, unspecified site: Secondary | ICD-10-CM | POA: Diagnosis not present

## 2022-07-19 DIAGNOSIS — Z1152 Encounter for screening for COVID-19: Secondary | ICD-10-CM | POA: Diagnosis not present

## 2022-07-21 ENCOUNTER — Other Ambulatory Visit: Payer: Self-pay | Admitting: Cardiology

## 2022-07-22 DIAGNOSIS — J449 Chronic obstructive pulmonary disease, unspecified: Secondary | ICD-10-CM | POA: Diagnosis not present

## 2022-07-22 DIAGNOSIS — J411 Mucopurulent chronic bronchitis: Secondary | ICD-10-CM | POA: Diagnosis not present

## 2022-07-24 DIAGNOSIS — R6889 Other general symptoms and signs: Secondary | ICD-10-CM | POA: Diagnosis not present

## 2022-07-24 DIAGNOSIS — Z743 Need for continuous supervision: Secondary | ICD-10-CM | POA: Diagnosis not present

## 2022-07-24 DIAGNOSIS — J449 Chronic obstructive pulmonary disease, unspecified: Secondary | ICD-10-CM | POA: Diagnosis not present

## 2022-07-24 DIAGNOSIS — Z880 Allergy status to penicillin: Secondary | ICD-10-CM | POA: Diagnosis not present

## 2022-07-24 DIAGNOSIS — Z91040 Latex allergy status: Secondary | ICD-10-CM | POA: Diagnosis not present

## 2022-07-24 DIAGNOSIS — Z20822 Contact with and (suspected) exposure to covid-19: Secondary | ICD-10-CM | POA: Diagnosis not present

## 2022-07-24 DIAGNOSIS — R051 Acute cough: Secondary | ICD-10-CM | POA: Diagnosis not present

## 2022-07-24 DIAGNOSIS — Z888 Allergy status to other drugs, medicaments and biological substances status: Secondary | ICD-10-CM | POA: Diagnosis not present

## 2022-07-24 DIAGNOSIS — R0981 Nasal congestion: Secondary | ICD-10-CM | POA: Diagnosis not present

## 2022-07-24 DIAGNOSIS — Z91012 Allergy to eggs: Secondary | ICD-10-CM | POA: Diagnosis not present

## 2022-07-24 DIAGNOSIS — R059 Cough, unspecified: Secondary | ICD-10-CM | POA: Diagnosis not present

## 2022-07-24 DIAGNOSIS — Z87891 Personal history of nicotine dependence: Secondary | ICD-10-CM | POA: Diagnosis not present

## 2022-07-27 DIAGNOSIS — Z87891 Personal history of nicotine dependence: Secondary | ICD-10-CM | POA: Diagnosis not present

## 2022-07-27 DIAGNOSIS — R110 Nausea: Secondary | ICD-10-CM | POA: Diagnosis not present

## 2022-07-27 DIAGNOSIS — R1011 Right upper quadrant pain: Secondary | ICD-10-CM | POA: Diagnosis not present

## 2022-07-27 DIAGNOSIS — Z888 Allergy status to other drugs, medicaments and biological substances status: Secondary | ICD-10-CM | POA: Diagnosis not present

## 2022-07-27 DIAGNOSIS — J069 Acute upper respiratory infection, unspecified: Secondary | ICD-10-CM | POA: Diagnosis not present

## 2022-07-27 DIAGNOSIS — Z880 Allergy status to penicillin: Secondary | ICD-10-CM | POA: Diagnosis not present

## 2022-07-27 DIAGNOSIS — R109 Unspecified abdominal pain: Secondary | ICD-10-CM | POA: Diagnosis not present

## 2022-08-07 DIAGNOSIS — R109 Unspecified abdominal pain: Secondary | ICD-10-CM | POA: Diagnosis not present

## 2022-08-07 DIAGNOSIS — Z87891 Personal history of nicotine dependence: Secondary | ICD-10-CM | POA: Diagnosis not present

## 2022-08-07 DIAGNOSIS — R110 Nausea: Secondary | ICD-10-CM | POA: Diagnosis not present

## 2022-08-07 DIAGNOSIS — Z743 Need for continuous supervision: Secondary | ICD-10-CM | POA: Diagnosis not present

## 2022-08-07 DIAGNOSIS — Z880 Allergy status to penicillin: Secondary | ICD-10-CM | POA: Diagnosis not present

## 2022-08-07 DIAGNOSIS — J069 Acute upper respiratory infection, unspecified: Secondary | ICD-10-CM | POA: Diagnosis not present

## 2022-08-07 DIAGNOSIS — R6889 Other general symptoms and signs: Secondary | ICD-10-CM | POA: Diagnosis not present

## 2022-08-07 DIAGNOSIS — R059 Cough, unspecified: Secondary | ICD-10-CM | POA: Diagnosis not present

## 2022-08-07 DIAGNOSIS — R1011 Right upper quadrant pain: Secondary | ICD-10-CM | POA: Diagnosis not present

## 2022-08-07 DIAGNOSIS — Z888 Allergy status to other drugs, medicaments and biological substances status: Secondary | ICD-10-CM | POA: Diagnosis not present

## 2022-08-22 DIAGNOSIS — J411 Mucopurulent chronic bronchitis: Secondary | ICD-10-CM | POA: Diagnosis not present

## 2022-08-22 DIAGNOSIS — J449 Chronic obstructive pulmonary disease, unspecified: Secondary | ICD-10-CM | POA: Diagnosis not present

## 2022-08-25 ENCOUNTER — Encounter: Payer: Self-pay | Admitting: Gastroenterology

## 2022-09-01 ENCOUNTER — Ambulatory Visit: Payer: 59 | Attending: Nurse Practitioner | Admitting: Nurse Practitioner

## 2022-09-01 ENCOUNTER — Encounter: Payer: Self-pay | Admitting: Nurse Practitioner

## 2022-09-01 VITALS — BP 120/80 | HR 56 | Ht 69.0 in | Wt 298.6 lb

## 2022-09-01 DIAGNOSIS — I10 Essential (primary) hypertension: Secondary | ICD-10-CM

## 2022-09-01 DIAGNOSIS — J449 Chronic obstructive pulmonary disease, unspecified: Secondary | ICD-10-CM

## 2022-09-01 DIAGNOSIS — I5032 Chronic diastolic (congestive) heart failure: Secondary | ICD-10-CM

## 2022-09-01 DIAGNOSIS — E785 Hyperlipidemia, unspecified: Secondary | ICD-10-CM | POA: Diagnosis not present

## 2022-09-01 DIAGNOSIS — Z8719 Personal history of other diseases of the digestive system: Secondary | ICD-10-CM | POA: Diagnosis not present

## 2022-09-01 NOTE — Patient Instructions (Addendum)
Medication Instructions:  Your physician recommends that you continue on your current medications as directed. Please refer to the Current Medication list given to you today.  Labwork: none  Testing/Procedures: none  Follow-Up: Your physician recommends that you schedule a follow-up appointment in: 6 months with Dr. Domenic Polite  Any Other Special Instructions Will Be Listed Below (If Applicable). You have been referred to Gastroenterology  If you need a refill on your cardiac medications before your next appointment, please call your pharmacy.

## 2022-09-01 NOTE — Progress Notes (Signed)
Cardiology Office Note:    Date:  09/01/2022  ID:  Charles Mathews, DOB 10-Sep-1960, MRN [id number]  PCP:  Caryl Bis, MD   [facility] Cardiologist:  Rozann Lesches, MD     Referring MD: Bonnita Hollow, MD   CC: Here for follow-up  History of Present Illness:    Charles Mathews is a 62 y.o. male with a hx of the following:  Chronic diastolic CHF HTN Obesity Hx of chest pain COPD Hiatal hernia  Patient is a very pleasant 62 year old male with past medical history as mentioned above.  Echocardiogram in 2022 revealed EF 55 to 60%, no RWMA, mild LVH, no MR, overall no significant valvular dysfunction.  Myoview in 2022 was low risk, normal study.  Seen by Dr. Domenic Polite on March 22, 2021.  Previously saw Dr. Bronson Ing.  He was overall doing well from a cardiac perspective.  He was being followed by [facility], was wearing supplemental oxygen typically, but did not have it at office visit that day.  No changes were made to his medications and no testing was ordered.  He was told to follow-up in 6 months.  Since last office visit, he has had several ED visits within the past year.  Most significantly he presented to [facility] ED on October 08, 2021 with chief complaint of syncope, passed out at dinner, also was complaining of diarrhea and shortness of breath.  Stated he had passed out while at out at [employer].  He had an additional syncopal episode in the bathroom while in the waiting room.  Did also no abdominal pain, was noting shortness of breath and was noted to wear 2 to 3 L of oxygen continuously due to history of COPD, however he was without oxygen that day.  Workup in ED was overall unremarkable.  Patient's vomiting and diarrhea was thought to be due to a viral gastroenteritis.  CT of abdomen and pelvis was unremarkable, nothing acute.  He denied any cardiac complaints surrounding his syncopal episode.  No evidence of seizures surrounding event.  Was d/c in stable condition and instructed to closely f/u with PCP.   Had ED vist on June 28, 2022 with chief complaint of generalized abdominal pain, nausea.  Complained of burning with urination as well.  Reported history of kidney stones, denied fevers, chills, nausea or vomiting.  He was seen prior to this visit in the ED 3 days ago with exact same symptoms.  Denied any chest pain or shortness of breath.  Overall labs are unremarkable.  He had a total of 4 CT scans of his abdomen and pelvis in 2023 that were all unremarkable.  Urinalysis from 3 days prior was unremarkable.  Was discharged in stable condition that day.  07/01/2022 - I saw him overdue follow-up visit with his CMA. CC was central chest pain, non-radiating, noticeable with swallowing, chronic and stable.  Pain is relieved when resting and made worse with picking up heavy objects. History of hiatal hernia. Sees a GI doctor. Noted chronic, stable dyspnea with exertion.  Updated echocardiogram revealed normal EF, mildly calcified aortic valve, no significant valvular abnormalities.  09/01/2022 - today presents for follow-up.  He states he is doing well.  Denies any recurring chest pain, shortness of breath, palpitations, syncope, presyncope, dizziness, orthopnea, PND, swelling or significant weight changes, acute bleeding, or claudication.  Tolerating his medications well.  Denies any questions or concerns today.  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[place])    Seizures ([place])     10/27/20- last one  age [age]   Sleep apnea    Urinary urgency     Past Surgical History:  Procedure Laterality Date   ARTERY BIOPSY N/A 10/28/2020   Procedure: BIOPSY TEMPORAL ARTERY;  Surgeon: Rosetta Posner, MD;  Location: [facility] OR;  Service: Vascular;  Laterality: N/A;   BIOPSY  07/06/2021   Procedure: BIOPSY;  Surgeon: Harvel Quale, MD;  [facility];  Service: Gastroenterology;;   COLONOSCOPY WITH PROPOFOL N/A 07/06/2021   Procedure: COLONOSCOPY WITH PROPOFOL;  Surgeon: Harvel Quale, MD;  [facility]: [facility];  Service: Gastroenterology;  Laterality: N/A;  10:15   CYSTOSCOPY WITH RETROGRADE PYELOGRAM, URETEROSCOPY AND STENT PLACEMENT Left 11/30/2020   Procedure: CYSTOSCOPY WITH LEFT RETROGRADE PYELOGRAM, LEFT URETEROSCOPY AND LEFT STENT PLACEMENT;  Surgeon: Cleon Gustin, MD;  Location: [facility];  Service: Urology;  Laterality: Left;   ESOPHAGOGASTRODUODENOSCOPY (EGD) WITH PROPOFOL N/A 07/06/2021   Procedure: ESOPHAGOGASTRODUODENOSCOPY (EGD) WITH PROPOFOL;  Surgeon: Harvel Quale, MD;  [facility];  Service: Gastroenterology;  Laterality: N/A;   LITHOTRIPSY  2009   MULTIPLE EXTRACTIONS WITH ALVEOLOPLASTY N/A 12/17/2012   Procedure: MULTIPLE EXTRACTION WITH ALVEOLOPLASTY;  Surgeon: Gae Bon, DDS;  Location: [place];  Service: Oral Surgery;  Laterality: N/A;   POLYPECTOMY  07/06/2021   Procedure: POLYPECTOMY;  Surgeon: Harvel Quale, MD;  [facility];  Service: Gastroenterology;;   Azzie Almas DILATION  07/06/2021   Procedure: Azzie Almas DILATION;  Surgeon: Montez Morita, Quillian Quince, MD;  [facility];  Service: Gastroenterology;;    Current Medications: Current Meds  Medication Sig   ACCU-CHEK GUIDE test strip USE ONCE DAILY TO TEST SUGAR   Accu-Chek Softclix Lancets lancets USE TO TEST ONCE DAILY   acetaminophen (TYLENOL) 650 MG CR tablet Take 1,300 mg by mouth every 8 (eight) hours as needed for pain.   albuterol  (VENTOLIN HFA) 108 (90 Base) MCG/ACT inhaler Inhale 1 puff into the lungs every 6 (six) hours as needed for wheezing or shortness of breath.   aspirin EC 81 MG tablet Take 81 mg by mouth daily.   benztropine (COGENTIN) 1 MG tablet Take 1 mg by mouth daily.   Blood Glucose Monitoring Suppl (ACCU-CHEK GUIDE) w/Device KIT USE ONCE DAILY TO TEST SUAGR   Budeson-Glycopyrrol-Formoterol (BREZTRI AEROSPHERE) 160-9-4.8 MCG/ACT AERO Inhale 2 puffs into the lungs in the morning and at bedtime.   candesartan (ATACAND) 16 MG tablet Take 8 mg by mouth daily.   Carboxymethylcellul-Glycerin (CLEAR EYES FOR DRY EYES) 1-0.25 % SOLN Place 1 drop into both eyes daily as needed (dry eyes).   cetirizine (ZYRTEC) 10 MG tablet Take 10 mg by mouth daily.   Desvenlafaxine ER (PRISTIQ) 50 MG TB24 Take 50 mg by mouth daily.   dicyclomine (BENTYL) 10 MG capsule Take 1 capsule (10 mg total) by  mouth 3 (three) times daily as needed for spasms.   doxazosin (CARDURA) 2 MG tablet TAKE 1 TABLET BY MOUTH DAILY - MUST SCHEDULE APPOINTMENT FOR MORE REFILLS   EMGALITY 120 MG/ML SOAJ INJECT 120 MG into THE SKIN EVERY 28 DAYS   fluticasone (FLONASE) 50 MCG/ACT nasal spray INSTILL ONE SPRAY INTO BOTH NOSTRILS DAILY   furosemide (LASIX) 40 MG tablet TAKE 1 TABLET BY MOUTH DAILY   gabapentin (NEURONTIN) 100 MG capsule Take 100 mg by mouth at bedtime.   meclizine (ANTIVERT) 25 MG tablet Take 12.5 mg by mouth daily.   Melatonin 10 MG TABS Take 10 mg by mouth at bedtime.   metFORMIN (GLUCOPHAGE) 500 MG tablet Take 500 mg by mouth daily.   metoprolol tartrate (LOPRESSOR) 50 MG tablet TAKE 1 TABLET BY MOUTH TWICE DAILY   Multiple Vitamin (MULTIVITAMIN) tablet Take 2 tablets by mouth daily. Gummies   omeprazole (PRILOSEC) 20 MG capsule Take 1 capsule (20 mg total) by mouth daily.   ondansetron (ZOFRAN ODT) 4 MG disintegrating tablet Take 1 tablet (4 mg total) by mouth every 8 (eight) hours as needed for nausea or vomiting.   paliperidone  (INVEGA) 3 MG 24 hr tablet Take 3 mg by mouth daily.   phenazopyridine (PYRIDIUM) 100 MG tablet Take 1 tablet (100 mg total) by mouth 3 (three) times daily as needed for pain.   potassium chloride SA (KLOR-CON M) 20 MEQ tablet TAKE 1 TABLET BY MOUTH DAILY   rosuvastatin (CRESTOR) 40 MG tablet Take 40 mg by mouth daily.   Semaglutide,0.25 or 0.'5MG'$ /DOS, (OZEMPIC, 0.25 OR 0.5 MG/DOSE,) 2 MG/1.5ML SOPN Inject 0.25 mg into the skin every Friday.   sertraline (ZOLOFT) 100 MG tablet Take 100 mg by mouth at bedtime.   topiramate (TOPAMAX) 50 MG tablet Take 50 mg by mouth daily.   traMADol (ULTRAM) 50 MG tablet Take 50 mg by mouth every 6 (six) hours as needed for severe pain.   traZODone (DESYREL) 100 MG tablet Take 100 mg by mouth at bedtime.   Vitamin D, Ergocalciferol, (DRISDOL) 1.25 MG (50000 UNIT) CAPS capsule Take 50,000 Units by mouth every 7 (seven) days.     Allergies:   Bee venom, Benadryl [diphenhydramine], Lorazepam, Penicillins, and Latex   Social History   Socioeconomic History   Marital status: Single    Spouse name: Not on file   Number of children: Not on file   Years of education: Not on file   Highest education level: Not on file  Occupational History   Not on file  Tobacco Use   Smoking status: Former    Packs/day: 2.00    Years: 27.00    Total pack years: 54.00    Types: Cigarettes    Quit date: 08/01/2002    Years since quitting: 20.1    Passive exposure: Past   Smokeless tobacco: Never   Tobacco comments:    Patient states that he never was an everyday smoker only tried it when he was 65  Vaping Use   Vaping Use: Never used  Substance and Sexual Activity   Alcohol use: No   Drug use: No   Sexual activity: Never  Other Topics Concern   Not on file  Social History Narrative   Left handed   Social Determinants of Health   Financial Resource Strain: Not on file  Food Insecurity: Not on file  Transportation Needs: Not on file  Physical Activity: Not on file   Stress: Not on file  Social Connections:  Not on file     ROS:   Review of Systems  Constitutional:  Positive for malaise/fatigue. Negative for chills, diaphoresis, fever and weight loss.  HENT: Negative.    Eyes: Negative.   Respiratory:  Positive for shortness of breath. Negative for cough, hemoptysis, sputum production and wheezing.        Note with exertion.  Stable/chronic.  Cardiovascular: Negative.   Gastrointestinal: Negative.   Genitourinary: Negative.   Musculoskeletal: Negative.   Skin: Negative.   Neurological: Negative.   Endo/Heme/Allergies: Negative.   Psychiatric/Behavioral: Negative.       Please see the history of present illness.    All other systems reviewed and are negative.  EKGs/Labs/Other Studies Reviewed:    The following studies were reviewed today:   EKG:  EKG is not ordered today.   Echocardiogram on July 14, 2022: 1. Left ventricular ejection fraction, by estimation, is 55 to 60%. The  left ventricle has normal function. Left ventricular endocardial border  not optimally defined to evaluate regional wall motion. Left ventricular  diastolic parameters were normal.  The average left ventricular global longitudinal strain is -19.4 %. The  global longitudinal strain is normal.   2. Right ventricular systolic function is normal. The right ventricular  size is mildly enlarged.   3. The mitral valve is grossly normal. No evidence of mitral valve  regurgitation. No evidence of mitral stenosis.   4. The aortic valve was not well visualized. There is mild calcification  of the aortic valve. Aortic valve regurgitation is not visualized. No  aortic stenosis is present.   5. The inferior vena cava is normal in size with greater than 50%  respiratory variability, suggesting right atrial pressure of 3 mmHg.   Comparison(s): No significant change from prior study.  Myoview on 09/01/2020: No diagnostic ST segment changes to indicate ischemia. Small,  mild intensity, apical anteroseptal and basal inferolateral defects that are fixed and most consistent with soft tissue attenuation. This is a low risk study. Nuclear stress EF: 52%.   2D Echo on 08/28/2020:  1. Left ventricular ejection fraction, by estimation, is 55 to 60%. The  left ventricle has normal function. The left ventricle has no regional  wall motion abnormalities. There is mild left ventricular hypertrophy.  Left ventricular diastolic parameters  were normal.   2. Right ventricular systolic function is normal. The right ventricular  size is normal.   3. The mitral valve is normal in structure. No evidence of mitral valve  regurgitation. No evidence of mitral stenosis.   4. The aortic valve is tricuspid. Aortic valve regurgitation is not  visualized. No aortic stenosis is present.   Recent Labs: No results found for requested labs within last 365 days.  Recent Lipid Panel    Component Value Date/Time   CHOL 135 04/02/2011 0550   TRIG 84 04/02/2011 0550   HDL 40 04/02/2011 0550   CHOLHDL 3.4 04/02/2011 0550   VLDL 17 04/02/2011 0550   LDLCALC 78 04/02/2011 0550    Physical Exam:    VS:  BP 120/80   Pulse (!) 56   Ht '5\' 9"'$  (1.753 m)   Wt 298 lb 9.6 oz (135.4 kg)   SpO2 94%   BMI 44.10 kg/m     Wt Readings from Last 3 Encounters:  09/01/22 298 lb 9.6 oz (135.4 kg)  07/01/22 (!) 309 lb (140.2 kg)  07/06/21 (!) 328 lb 7.8 oz (149 kg)     GEN: Morbidly obese, 62 y.o.  male in no acute distress HEENT: Normal NECK: No JVD; No carotid bruits CARDIAC: S1/S2, RRR, no murmurs, rubs or gallops; 2+ pulses throughout RESPIRATORY:  Clear and diminished to auscultation without rales, wheezing or rhonchi  MUSCULOSKELETAL:  No edema; No deformity  SKIN: Warm and dry NEUROLOGIC:  Alert and oriented x 3 PSYCHIATRIC:  Normal affect   ASSESSMENT:    1. Chronic diastolic heart failure ([place])   2. Essential hypertension, benign   3. Hyperlipidemia, unspecified  hyperlipidemia type   4. Chronic obstructive pulmonary disease, unspecified COPD type ([place])   5. Morbid obesity ([place])   6. History of hiatal hernia    PLAN:    In order of problems listed above:  Chronic diastolic CHF Echo 79/8921 showed EF 55 to 19%, normal diastolic parameters. Euvolemic and well compensated on exam. Continue current medication regimen. Low sodium diet, fluid restriction <2L, and daily weights encouraged. Educated to contact our office for weight gain of 2 lbs overnight or 5 lbs in one week. Heart healthy diet and regular cardiovascular exercise encouraged.   HTN BP stable. BP well controlled at home. Continue current medication regimen. Discussed to monitor BP at home at least 2 hours after medications and sitting for 5-10 minutes. Heart healthy diet and regular cardiovascular exercise encouraged.   3. HLD Labs from June 2023 revealed total cholesterol 131, HDL 45, LDL 65, and triglycerides 117.  Continue rosuvastatin. Heart healthy diet and regular cardiovascular exercise encouraged.   4. COPD Denies any recent exacerbations. Continue to f/u with pulmonology. Continue current medication regimen.   5. Morbid obesity BMI today 45.63. Weight loss via diet and exercise encouraged. Discussed the impact being overweight would have on cardiovascular risk. Heart healthy diet and regular cardiovascular exercise encouraged.  Patient is a member of [employer], and I recommended increasing physical activity.  He verbalized understanding.  6.  Hiatal hernia Pt is requesting referral to GI specialist. Will make referral for him today.     7. Disposition: Follow-up with me or Dr. Domenic Polite in 6 months or sooner if anything changes.   Medication Adjustments/Labs and Tests Ordered: Current medicines are reviewed at length with the patient today.  Concerns regarding medicines are outlined above.  Orders Placed This Encounter  Procedures   Ambulatory referral to Gastroenterology    No orders of the defined types were placed in this encounter.   Patient Instructions  Medication Instructions:  Your physician recommends that you continue on your current medications as directed. Please refer to the Current Medication list given to you today.  Labwork: none  Testing/Procedures: none  Follow-Up: Your physician recommends that you schedule a follow-up appointment in: 6 months with Dr. Domenic Polite  Any Other Special Instructions Will Be Listed Below (If Applicable). You have been referred to Gastroenterology  If you need a refill on your cardiac medications before your next appointment, please call your pharmacy.    SignedFinis Bud, NP  09/03/2022 11:17 AM    [place]

## 2022-09-04 DIAGNOSIS — Z886 Allergy status to analgesic agent status: Secondary | ICD-10-CM | POA: Diagnosis not present

## 2022-09-04 DIAGNOSIS — Z87891 Personal history of nicotine dependence: Secondary | ICD-10-CM | POA: Diagnosis not present

## 2022-09-04 DIAGNOSIS — M549 Dorsalgia, unspecified: Secondary | ICD-10-CM | POA: Diagnosis not present

## 2022-09-04 DIAGNOSIS — Z885 Allergy status to narcotic agent status: Secondary | ICD-10-CM | POA: Diagnosis not present

## 2022-09-04 DIAGNOSIS — I10 Essential (primary) hypertension: Secondary | ICD-10-CM | POA: Diagnosis not present

## 2022-09-04 DIAGNOSIS — M545 Low back pain, unspecified: Secondary | ICD-10-CM | POA: Diagnosis not present

## 2022-09-04 DIAGNOSIS — Z880 Allergy status to penicillin: Secondary | ICD-10-CM | POA: Diagnosis not present

## 2022-09-04 DIAGNOSIS — R6889 Other general symptoms and signs: Secondary | ICD-10-CM | POA: Diagnosis not present

## 2022-09-04 DIAGNOSIS — Z888 Allergy status to other drugs, medicaments and biological substances status: Secondary | ICD-10-CM | POA: Diagnosis not present

## 2022-09-04 DIAGNOSIS — Z743 Need for continuous supervision: Secondary | ICD-10-CM | POA: Diagnosis not present

## 2022-09-07 DIAGNOSIS — S29019A Strain of muscle and tendon of unspecified wall of thorax, initial encounter: Secondary | ICD-10-CM | POA: Diagnosis not present

## 2022-09-07 DIAGNOSIS — M545 Low back pain, unspecified: Secondary | ICD-10-CM | POA: Diagnosis not present

## 2022-09-19 DIAGNOSIS — M2560 Stiffness of unspecified joint, not elsewhere classified: Secondary | ICD-10-CM | POA: Diagnosis not present

## 2022-09-19 DIAGNOSIS — R2689 Other abnormalities of gait and mobility: Secondary | ICD-10-CM | POA: Diagnosis not present

## 2022-09-19 DIAGNOSIS — R29898 Other symptoms and signs involving the musculoskeletal system: Secondary | ICD-10-CM | POA: Diagnosis not present

## 2022-09-19 DIAGNOSIS — M545 Low back pain, unspecified: Secondary | ICD-10-CM | POA: Diagnosis not present

## 2022-09-22 DIAGNOSIS — J449 Chronic obstructive pulmonary disease, unspecified: Secondary | ICD-10-CM | POA: Diagnosis not present

## 2022-09-22 DIAGNOSIS — J411 Mucopurulent chronic bronchitis: Secondary | ICD-10-CM | POA: Diagnosis not present

## 2022-09-25 NOTE — Progress Notes (Deleted)
GI Office Note    Referring Provider: Caryl Bis, MD Primary Care Physician:  Caryl Bis, MD Primary Gastroenterologist: Harvel Quale, MD  Date:  09/25/2022  ID:  Charles Mathews, DOB [birth year], MRN [id number]   Chief Complaint   No chief complaint on file.   History of Present Illness  Charles Mathews is a 62 y.o. male with a history of anxiety, asthma, PUD, HTN, GERD, chronic back pain, obesity, HLD, seizures, COPD*** presenting today with complaint of  abdominal pain.  EGD 07/06/21: -***  Colonoscopy 07/06/21: -***  Per review of chart patient has had multiple ED visits in the last 5 months for abdominal pain. 1 in October, 2 in November, 3 in December, 1 in January.   CT A/P 06/25/22: -no acute abnormality -left nephrolithiasis -colonic diverticulosis without diverticulitis  Abdominal US 07/06/23: -CBD 52m -hepatic steatosis -no liver lesions -patent portal vein  HIDA 07/27/22: -patent cystic and CBD -normal gallbladder EF  CT A/P 08/07/22:  -***  Today:    Current Outpatient Medications  Medication Sig Dispense Refill   ACCU-CHEK GUIDE test strip USE ONCE DAILY TO TEST SUGAR     Accu-Chek Softclix Lancets lancets USE TO TEST ONCE DAILY     acetaminophen (TYLENOL) 650 MG CR tablet Take 1,300 mg by mouth every 8 (eight) hours as needed for pain.     albuterol (VENTOLIN HFA) 108 (90 Base) MCG/ACT inhaler Inhale 1 puff into the lungs every 6 (six) hours as needed for wheezing or shortness of breath.     aspirin EC 81 MG tablet Take 81 mg by mouth daily.     benztropine (COGENTIN) 1 MG tablet Take 1 mg by mouth daily.     Blood Glucose Monitoring Suppl (ACCU-CHEK GUIDE) w/Device KIT USE ONCE DAILY TO TEST SUAGR     Budeson-Glycopyrrol-Formoterol (BREZTRI AEROSPHERE) 160-9-4.8 MCG/ACT AERO Inhale 2 puffs into the lungs in the morning and at bedtime.     candesartan (ATACAND) 16 MG tablet Take 8 mg by mouth daily.      Carboxymethylcellul-Glycerin (CLEAR EYES FOR DRY EYES) 1-0.25 % SOLN Place 1 drop into both eyes daily as needed (dry eyes).     cetirizine (ZYRTEC) 10 MG tablet Take 10 mg by mouth daily.     dicyclomine (BENTYL) 10 MG capsule Take 1 capsule (10 mg total) by mouth 3 (three) times daily as needed for spasms. 90 capsule 3   doxazosin (CARDURA) 2 MG tablet TAKE 1 TABLET BY MOUTH DAILY - MUST SCHEDULE APPOINTMENT FOR MORE REFILLS 90 tablet 0   EMGALITY 120 MG/ML SOAJ INJECT 120 MG into THE SKIN EVERY 28 DAYS 1 mL 0   fluticasone (FLONASE) 50 MCG/ACT nasal spray INSTILL ONE SPRAY INTO BOTH NOSTRILS DAILY 16 g 3   furosemide (LASIX) 40 MG tablet TAKE 1 TABLET BY MOUTH DAILY 90 tablet 0   gabapentin (NEURONTIN) 100 MG capsule Take 100 mg by mouth at bedtime.     meclizine (ANTIVERT) 25 MG tablet Take 12.5 mg by mouth daily.     Melatonin 10 MG TABS Take 10 mg by mouth at bedtime.     metFORMIN (GLUCOPHAGE) 500 MG tablet Take 500 mg by mouth daily.     metoprolol tartrate (LOPRESSOR) 50 MG tablet TAKE 1 TABLET BY MOUTH TWICE DAILY 180 tablet 0   Multiple Vitamin (MULTIVITAMIN) tablet Take 2 tablets by mouth daily. Gummies     omeprazole (PRILOSEC) 20 MG capsule Take 1 capsule (20 mg total) by  mouth daily. 90 capsule 3   ondansetron (ZOFRAN ODT) 4 MG disintegrating tablet Take 1 tablet (4 mg total) by mouth every 8 (eight) hours as needed for nausea or vomiting. 20 tablet 5   paliperidone (INVEGA) 3 MG 24 hr tablet Take 3 mg by mouth daily.     phenazopyridine (PYRIDIUM) 100 MG tablet Take 1 tablet (100 mg total) by mouth 3 (three) times daily as needed for pain. 15 tablet 0   potassium chloride SA (KLOR-CON M) 20 MEQ tablet TAKE 1 TABLET BY MOUTH DAILY 90 tablet 0   rosuvastatin (CRESTOR) 40 MG tablet Take 40 mg by mouth daily.     Semaglutide,0.25 or 0.'5MG'$ /DOS, (OZEMPIC, 0.25 OR 0.5 MG/DOSE,) 2 MG/1.5ML SOPN Inject 0.25 mg into the skin every Friday.     sertraline (ZOLOFT) 100 MG tablet Take 100 mg by  mouth at bedtime.     topiramate (TOPAMAX) 50 MG tablet Take 50 mg by mouth daily.     traMADol (ULTRAM) 50 MG tablet Take 50 mg by mouth every 6 (six) hours as needed for severe pain.     traZODone (DESYREL) 100 MG tablet Take 100 mg by mouth at bedtime.     Vitamin D, Ergocalciferol, (DRISDOL) 1.25 MG (50000 UNIT) CAPS capsule Take 50,000 Units by mouth every 7 (seven) days.     No current facility-administered medications for this visit.    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)     10/27/20- last one age 39-14   Sleep apnea    Urinary urgency     Past Surgical History:  Procedure Laterality Date   ARTERY BIOPSY N/A 10/28/2020   Procedure: BIOPSY TEMPORAL ARTERY;  Surgeon: Rosetta Posner, MD;  Location: [facility] OR;  Service: Vascular;  Laterality: N/A;   BIOPSY  07/06/2021   Procedure: BIOPSY;  Surgeon: Harvel Quale, MD;  [facility];  Service: Gastroenterology;;   COLONOSCOPY WITH PROPOFOL N/A 07/06/2021   Procedure: COLONOSCOPY WITH PROPOFOL;  Surgeon: Harvel Quale, MD;  [facility];  Service: Gastroenterology;  Laterality: N/A;  10:15   CYSTOSCOPY WITH RETROGRADE PYELOGRAM, URETEROSCOPY AND STENT PLACEMENT Left 11/30/2020   Procedure: CYSTOSCOPY WITH LEFT RETROGRADE PYELOGRAM, LEFT URETEROSCOPY AND LEFT STENT PLACEMENT;  Surgeon: Cleon Gustin, MD;  Location: [facility];  Service: Urology;  Laterality: Left;   ESOPHAGOGASTRODUODENOSCOPY (EGD) WITH PROPOFOL N/A 07/06/2021    Procedure: ESOPHAGOGASTRODUODENOSCOPY (EGD) WITH PROPOFOL;  Surgeon: Harvel Quale, MD;  [facility];  Service: Gastroenterology;  Laterality: N/A;   LITHOTRIPSY  2009   MULTIPLE EXTRACTIONS WITH ALVEOLOPLASTY N/A 12/17/2012   Procedure: MULTIPLE EXTRACTION WITH ALVEOLOPLASTY;  Surgeon: Gae Bon, DDS;  Location: [place];  Service: Oral Surgery;  Laterality: N/A;   POLYPECTOMY  07/06/2021   Procedure: POLYPECTOMY;  Surgeon: Harvel Quale, MD;  [facility];  Service: Gastroenterology;;   Azzie Almas DILATION  07/06/2021   Procedure: Azzie Almas DILATION;  Surgeon: Montez Morita, Quillian Quince, MD;  [facility];  Service: Gastroenterology;;    Family History  Adopted: Yes    Allergies as of 09/26/2022 - Review Complete 09/01/2022  Allergen Reaction Noted   Bee venom Anaphylaxis 03/31/2011   Benadryl [diphenhydramine]  01/15/2018   Lorazepam Other (See Comments) 04/19/2011   Penicillins Other (See Comments) 03/31/2011   Latex Rash 10/19/2020    Social History   Socioeconomic History   Marital status: Single    Spouse name: Not on file   Number of children: Not on file   Years of education: Not on file   Highest education level: Not on file  Occupational History   Not on file  Tobacco Use   Smoking status: Former    Packs/day: 2.00    Years: 27.00    Total pack years: 54.00    Types: Cigarettes    Quit date: 08/01/2002    Years since quitting: 20.1    Passive exposure: Past   Smokeless tobacco: Never   Tobacco comments:    Patient states that he never was an everyday smoker only tried it when he was 59  Vaping Use   Vaping Use: Never used  Substance and Sexual Activity   Alcohol use: No   Drug use: No   Sexual activity: Never  Other Topics Concern   Not on file  Social History Narrative   Left handed   Social Determinants of Health   Financial Resource Strain: Not on file  Food Insecurity: Not on file  Transportation  Needs: Not on file  Physical Activity: Not on file  Stress: Not on file  Social Connections: Not on file     Review of Systems   Gen: Denies fever, chills, anorexia. Denies fatigue, weakness, weight loss.  CV: Denies chest pain, palpitations, syncope, peripheral edema, and claudication. Resp: Denies dyspnea at rest, cough, wheezing, coughing up blood, and pleurisy. GI: See HPI Derm: Denies rash, itching, dry skin Psych: Denies depression, anxiety, memory loss, confusion. No homicidal or suicidal ideation.  Heme: Denies bruising, bleeding, and enlarged lymph nodes.   Physical Exam   There were no vitals taken for this visit.  General:   Alert and oriented. No distress noted. Pleasant and cooperative.  Head:  Normocephalic and atraumatic. Eyes:  Conjuctiva clear without scleral icterus. Mouth:  Oral mucosa pink and moist. Good dentition. No lesions. Lungs:  Clear to auscultation bilaterally. No wheezes, rales, or rhonchi. No distress.  Heart:  S1, S2 present without murmurs appreciated.  Abdomen:  +BS, soft, non-tender and non-distended. No rebound or guarding. No HSM or masses noted. Rectal: *** Msk:  Symmetrical without gross deformities. Normal posture. Extremities:  Without edema. Neurologic:  Alert and  oriented x4 Psych:  Alert and cooperative. Normal mood and affect.   Assessment  Charles Mathews is a 62 y.o. male with a history of *** presenting today with   Abdominal pain:   PLAN   ***     Venetia Night, MSN, FNP-BC, AGACNP-BC [facility]

## 2022-09-26 ENCOUNTER — Ambulatory Visit: Payer: 59 | Admitting: Gastroenterology

## 2022-09-26 NOTE — Progress Notes (Unsigned)
GI Office Note    Referring Provider: Caryl Bis, MD Primary Care Physician:  Caryl Bis, MD Primary Gastroenterologist: Harvel Quale, MD  Date:  09/28/2022  ID:  Charles Mathews, DOB 07-15-1961, MRN [id number]   Chief Complaint   Chief Complaint  Patient presents with   New Patient (Initial Visit)    Pt is having abd pain right side pain that wraps around to his back. Pt has had CT, and U/S pt had colonoscopy in 2022 but has not been seen in this office or main   History of Present Illness  Charles Mathews is a 62 y.o. male with a history of anxiety, asthma, PUD, HTN, GERD, chronic back pain, obesity, HLD, seizures, COPD presenting today with complaint of abdominal pain.   EGD 07/06/21: -normal esophagus s/p dilation -mucosal changes suspicious for barrett's  -gastritis s/p biopsy (reactive foveolar hyperplasia, change compatible with PPI) -normal duodenum s/p biopsy -EGD in 3 years   Colonoscopy 07/06/21: -6 polyps in descending colon, splenic flexure, ascending colon -single polyp in the transverse colon -non bleeding internal hemorrhoids.  -all polyps with fragments of tubular adenomas.  -Repeat in 3 years   Per review of chart patient has had multiple ED visits in the last 5 months for abdominal pain. 1 in October, 2 in November, 3 in December, 1 in January.    CT A/P 06/25/22: -no acute abnormality -left nephrolithiasis -colonic diverticulosis without diverticulitis   Labs 07/04/22: normal LFTs, A1c 6.2, TSH 2.840, Triglycerides 184, HDL 34,   Abdominal US 07/06/23: -CBD 27m -hepatic steatosis -no liver lesions -patent portal vein  07/16/22: KUB with negative film.    HIDA 07/27/22: -patent cystic and CBD -normal gallbladder EF   CT A/P 08/07/22 at [facility]: Impression: 1. No acute findings within the abdomen or pelvis.  2. Previously noted inferior pole left kidney stone is no longer  visualized and has presumably passed since the exam  from 06/25/2022.  3. Sigmoid diverticulosis without acute diverticulitis.   Today: CNA is with him today.  Mostly a gassy type pain in the RLQ. Pain is there very often, comes and goes. At times when he shifts in one direction it throbs. Tries to have a BM everyday but has looser stools at times and some days it different. Once in a while he goes without a BM for 1 day but not often. Does not eat spicy foods. Does eat a lot of fried foods. Drinks carbonated dit sodas (zero ginger ale), sweet tea, cranberry juice. Rarely drinks water. When he eats out he commonly has looser stools. Will have some urgency. Likes to use mayonnaise and pickles with sandwiches. Uses buttered sweet pickles. Stools are dark at times - unsure if black. No bright red blood. Has a good appetite. Feel bloated and heavy in the mornings when he gets up and has a lot of gas.   Does have some occasionally burning with urination. Has history fo kidney stones.   Eats a lot of tums and does drink pepto quite often. Denies frequent NSAID use.   Doing PT now for his back pain.   Current Outpatient Medications  Medication Sig Dispense Refill   ACCU-CHEK GUIDE test strip USE ONCE DAILY TO TEST SUGAR     Accu-Chek Softclix Lancets lancets USE TO TEST ONCE DAILY     acetaminophen (TYLENOL) 650 MG CR tablet Take 1,300 mg by mouth every 8 (eight) hours as needed for pain.     albuterol (VENTOLIN  HFA) 108 (90 Base) MCG/ACT inhaler Inhale 1 puff into the lungs every 6 (six) hours as needed for wheezing or shortness of breath.     aspirin EC 81 MG tablet Take 81 mg by mouth daily.     benztropine (COGENTIN) 1 MG tablet Take 1 mg by mouth daily.     Blood Glucose Monitoring Suppl (ACCU-CHEK GUIDE) w/Device KIT USE ONCE DAILY TO TEST SUAGR     Budeson-Glycopyrrol-Formoterol (BREZTRI AEROSPHERE) 160-9-4.8 MCG/ACT AERO Inhale 2 puffs into the lungs in the morning and at bedtime.     candesartan (ATACAND) 16 MG tablet Take 8 mg by mouth daily.      Carboxymethylcellul-Glycerin (CLEAR EYES FOR DRY EYES) 1-0.25 % SOLN Place 1 drop into both eyes daily as needed (dry eyes).     cetirizine (ZYRTEC) 10 MG tablet Take 10 mg by mouth daily.     dicyclomine (BENTYL) 10 MG capsule Take 1 capsule (10 mg total) by mouth 3 (three) times daily as needed for spasms. 90 capsule 3   doxazosin (CARDURA) 2 MG tablet TAKE 1 TABLET BY MOUTH DAILY - MUST SCHEDULE APPOINTMENT FOR MORE REFILLS 90 tablet 0   EMGALITY 120 MG/ML SOAJ INJECT 120 MG into THE SKIN EVERY 28 DAYS 1 mL 0   fluticasone (FLONASE) 50 MCG/ACT nasal spray INSTILL ONE SPRAY INTO BOTH NOSTRILS DAILY 16 g 3   furosemide (LASIX) 40 MG tablet TAKE 1 TABLET BY MOUTH DAILY 90 tablet 0   gabapentin (NEURONTIN) 100 MG capsule Take 100 mg by mouth at bedtime.     meclizine (ANTIVERT) 25 MG tablet Take 12.5 mg by mouth daily.     Melatonin 10 MG TABS Take 10 mg by mouth at bedtime.     metFORMIN (GLUCOPHAGE) 500 MG tablet Take 500 mg by mouth daily.     metoprolol tartrate (LOPRESSOR) 50 MG tablet TAKE 1 TABLET BY MOUTH TWICE DAILY 180 tablet 0   Multiple Vitamin (MULTIVITAMIN) tablet Take 2 tablets by mouth daily. Gummies     omeprazole (PRILOSEC) 20 MG capsule Take 1 capsule (20 mg total) by mouth daily. 90 capsule 3   ondansetron (ZOFRAN ODT) 4 MG disintegrating tablet Take 1 tablet (4 mg total) by mouth every 8 (eight) hours as needed for nausea or vomiting. 20 tablet 5   paliperidone (INVEGA) 3 MG 24 hr tablet Take 3 mg by mouth daily.     phenazopyridine (PYRIDIUM) 100 MG tablet Take 1 tablet (100 mg total) by mouth 3 (three) times daily as needed for pain. 15 tablet 0   potassium chloride SA (KLOR-CON M) 20 MEQ tablet TAKE 1 TABLET BY MOUTH DAILY 90 tablet 0   rosuvastatin (CRESTOR) 40 MG tablet Take 40 mg by mouth daily.     Semaglutide,0.25 or 0.'5MG'$ /DOS, (OZEMPIC, 0.25 OR 0.5 MG/DOSE,) 2 MG/1.5ML SOPN Inject 0.25 mg into the skin every Friday.     sertraline (ZOLOFT) 100 MG tablet Take 100  mg by mouth at bedtime.     topiramate (TOPAMAX) 50 MG tablet Take 50 mg by mouth daily.     traMADol (ULTRAM) 50 MG tablet Take 50 mg by mouth every 6 (six) hours as needed for severe pain.     traZODone (DESYREL) 100 MG tablet Take 100 mg by mouth at bedtime.     Vitamin D, Ergocalciferol, (DRISDOL) 1.25 MG (50000 UNIT) CAPS capsule Take 50,000 Units by mouth every 7 (seven) days.     No current facility-administered medications for this visit.  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[place])    Seizures ([place])     10/27/20- last one age 6-14   Sleep apnea    Urinary urgency     Past Surgical History:  Procedure Laterality Date   ARTERY BIOPSY N/A 10/28/2020   Procedure: BIOPSY TEMPORAL ARTERY;  Surgeon: Rosetta Posner, MD;  Location: [facility] OR;  Service: Vascular;  Laterality: N/A;   BIOPSY  07/06/2021   Procedure: BIOPSY;  Surgeon: Harvel Quale, MD;  [facility];  Service: Gastroenterology;;   COLONOSCOPY WITH PROPOFOL N/A 07/06/2021   Procedure: COLONOSCOPY WITH PROPOFOL;  Surgeon: Harvel Quale, MD;  [facility];  Service: Gastroenterology;  Laterality: N/A;  10:15   CYSTOSCOPY WITH RETROGRADE PYELOGRAM, URETEROSCOPY AND STENT PLACEMENT Left 11/30/2020   Procedure: CYSTOSCOPY WITH LEFT RETROGRADE PYELOGRAM, LEFT URETEROSCOPY AND LEFT STENT PLACEMENT;  Surgeon: Cleon Gustin, MD;  Location[facility];  Service: Urology;  Laterality: Left;   ESOPHAGOGASTRODUODENOSCOPY (EGD) WITH PROPOFOL N/A 07/06/2021    Procedure: ESOPHAGOGASTRODUODENOSCOPY (EGD) WITH PROPOFOL;  Surgeon: Harvel Quale, MD;  [facility];  Service: Gastroenterology;  Laterality: N/A;   LITHOTRIPSY  2009   MULTIPLE EXTRACTIONS WITH ALVEOLOPLASTY N/A 12/17/2012   Procedure: MULTIPLE EXTRACTION WITH ALVEOLOPLASTY;  Surgeon: Gae Bon, DDS;  Location: [place];  Service: Oral Surgery;  Laterality: N/A;   POLYPECTOMY  07/06/2021   Procedure: POLYPECTOMY;  Surgeon: Harvel Quale, MD;  [facility];  Service: Gastroenterology;;   Azzie Almas DILATION  07/06/2021   Procedure: Azzie Almas DILATION;  Surgeon: Montez Morita, Quillian Quince, MD;  [facility];  Service: Gastroenterology;;    Family History  Adopted: Yes    Allergies as of 09/28/2022 - Review Complete 09/28/2022  Allergen Reaction Noted   Bee venom Anaphylaxis 03/31/2011   Benadryl [diphenhydramine]  01/15/2018   Lorazepam Other (See Comments) 04/19/2011   Penicillins Other (See Comments) 03/31/2011   Latex Rash 10/19/2020    Social History   Socioeconomic History   Marital status: Single    Spouse name: Not on file   Number of children: Not on file   Years of education: Not on file   Highest education level: Not on file  Occupational History   Not on file  Tobacco Use   Smoking status: Former    Packs/day: 2.00    Years: 27.00    Total pack years: 54.00    Types: Cigarettes    Quit date: 08/01/2002    Years since quitting: 20.1    Passive exposure: Past   Smokeless tobacco: Never   Tobacco comments:    Patient states that he never was an everyday smoker only tried it when he was 68  Vaping Use   Vaping Use: Never used  Substance and Sexual Activity   Alcohol use: No   Drug use: No   Sexual activity:  Never  Other Topics Concern   Not on file  Social History Narrative   Left handed   Social Determinants of Health   Financial Resource Strain: Not on file  Food Insecurity: Not on file  Transportation  Needs: Not on file  Physical Activity: Not on file  Stress: Not on file  Social Connections: Not on file     Review of Systems   Gen: Denies fever, chills, anorexia. Denies fatigue, weakness, weight loss.  CV: Denies chest pain, palpitations, syncope, peripheral edema, and claudication. Resp: Denies dyspnea at rest, cough, wheezing, coughing up blood, and pleurisy. GI: See HPI Derm: Denies rash, itching, dry skin Psych: Denies depression, anxiety, memory loss, confusion. No homicidal or suicidal ideation.  Heme: Denies bruising, bleeding, and enlarged lymph nodes.   Physical Exam   BP (!) 147/83   Pulse 62   Temp 98.4 F (36.9 C)   Ht '5\' 9"'$  (1.753 m)   Wt (!) 303 lb (137.4 kg)   BMI 44.75 kg/m   General:   Alert and oriented. No distress noted. Pleasant and cooperative.  Head:  Normocephalic and atraumatic. Eyes:  Conjuctiva clear without scleral icterus. Mouth:  Oral mucosa pink and moist. Good dentition. No lesions. Lungs:  Clear to auscultation bilaterally. No wheezes, rales, or rhonchi. No distress.  Heart:  S1, S2 present without murmurs appreciated.  Abdomen:  +BS, soft, non-distended.  Rounded.  TTP to right lower quadrant and lateral right sided mid abdominal region.  Mild TTP to left lower quadrant and epigastrium.  No rebound or guarding. No HSM or masses noted. Rectal: deferred Msk:  Symmetrical without gross deformities. Normal posture. Extremities:  Without edema. Neurologic:  Alert and  oriented x4 Psych:  Alert and cooperative. Normal mood and affect.   Assessment  Charles Mathews is a 62 y.o. male with a history of anxiety, asthma, PUD, HTN, GERD, chronic back pain, obesity, HLD, seizures, COPD presenting today with complaint of right sided abdominal pain.  RLQ, constipation?,  Bloating: Right sided.  Workup thus far negative with KUB, abdominal ultrasound, CT A/P, and HIDA scan.  Labs in December 2023 with normal LFTs.  EGD and colonoscopy in December  2022.  EGD noting Barrett's esophagus as noted below as well as gastritis.  Colonoscopy with 6 polyps removed.  Patient describes pain as colicky and gas-like pain.  Does have some mild tenderness on exam today.  Negative Murphy's or McBurney's point.  Suspect a lot of this is related to his bowel function as well as dietary factors.  Patient is a difficult historian therefore unclear if he has constipation predominant symptoms as he reports at times he has diarrhea and also sometimes skips a day.  Also noted to have a little left lower quadrant discomfort on exam.  Will focus on conservative measures with dietary changes including a low FODMAP diet, avoiding common gas-producing foods and working toward weight loss.  Advised to increase fiber in his diet and start Benefiber or Metamucil 2 to 3 teaspoons daily.  For his gassiness/bloating we will trial simethicone 125 mg up to 4 times a day as needed.  GERD, epigastric pain, Barrett's esophagus: Has frequent GERD symptoms.  Has been on famotidine 20 mg twice daily as well as omeprazole 20 mg twice daily.  Has continued to have a lot of symptoms as of recently with eating Tums frequently and using Pepto-Bismol.  Likely having black stools secondary to Pepto-Bismol use.  Given this we will increase his omeprazole  to 40 mg twice daily.  Denies any overt dysphagia.  Does have Barrett's esophagus and gastritis noted on EGD December 2022.  Advised patient he needs to focus on dietary changes.  GERD diet/lifestyle modifications reinforced.  Due for surveillance EGD in 2025.  PLAN   Increase Omeprazole to 40 mg twice daily.  Continue famotidine 20 mg twice daily. GERD diet/lifestyle modifications Increase fiber in his diet, start benefiber or metamucil 2-3 teaspoons daily. Low FODMAP diet Simethicone 125 mg QID as needed Avoid common gas producing foods.  Work toward weight loss.  EGD and Colonoscopy in December 2025 Follow up in 4 months.     Venetia Night, MSN, FNP-BC, AGACNP-BC [facility]  I have reviewed the note and agree with the APP's assessment as described in this progress note  [name], MD Gastroenterology and [facility]

## 2022-09-27 DIAGNOSIS — R2689 Other abnormalities of gait and mobility: Secondary | ICD-10-CM | POA: Diagnosis not present

## 2022-09-27 DIAGNOSIS — R29898 Other symptoms and signs involving the musculoskeletal system: Secondary | ICD-10-CM | POA: Diagnosis not present

## 2022-09-27 DIAGNOSIS — M2560 Stiffness of unspecified joint, not elsewhere classified: Secondary | ICD-10-CM | POA: Diagnosis not present

## 2022-09-27 DIAGNOSIS — M545 Low back pain, unspecified: Secondary | ICD-10-CM | POA: Diagnosis not present

## 2022-09-28 ENCOUNTER — Ambulatory Visit (INDEPENDENT_AMBULATORY_CARE_PROVIDER_SITE_OTHER): Payer: 59 | Admitting: Gastroenterology

## 2022-09-28 ENCOUNTER — Encounter: Payer: Self-pay | Admitting: Gastroenterology

## 2022-09-28 VITALS — BP 152/84 | HR 62 | Temp 98.4°F | Ht 69.0 in | Wt 303.0 lb

## 2022-09-28 DIAGNOSIS — K2270 Barrett's esophagus without dysplasia: Secondary | ICD-10-CM | POA: Diagnosis not present

## 2022-09-28 DIAGNOSIS — R1031 Right lower quadrant pain: Secondary | ICD-10-CM | POA: Diagnosis not present

## 2022-09-28 DIAGNOSIS — R1030 Lower abdominal pain, unspecified: Secondary | ICD-10-CM | POA: Diagnosis not present

## 2022-09-28 DIAGNOSIS — K5900 Constipation, unspecified: Secondary | ICD-10-CM | POA: Diagnosis not present

## 2022-09-28 DIAGNOSIS — K219 Gastro-esophageal reflux disease without esophagitis: Secondary | ICD-10-CM

## 2022-09-28 DIAGNOSIS — R140 Abdominal distension (gaseous): Secondary | ICD-10-CM

## 2022-09-28 MED ORDER — SIMETHICONE 125 MG PO TABS: 125.0000 mg | ORAL_TABLET | Freq: Four times a day (QID) | ORAL | 1 refills | Status: DC | PRN

## 2022-09-28 MED ORDER — OMEPRAZOLE 40 MG PO CPDR: 40.0000 mg | DELAYED_RELEASE_CAPSULE | Freq: Two times a day (BID) | ORAL | 3 refills | Status: DC

## 2022-09-28 NOTE — Patient Instructions (Addendum)
We are increasing omeprazole to 40 mg twice daily.  I have sent an updated prescription to your pharmacy.  Continue taking famotidine 20 mg twice daily.  Please reduce use of Tums and Pepto-Bismol.  Pepto-Bismol can turn your stool black.  Follow a GERD diet:  Avoid fried, fatty, greasy, spicy, citrus foods. Avoid caffeine and carbonated beverages. Avoid chocolate. Try eating 4-6 small meals a day rather than 3 large meals. Do not eat within 3 hours of laying down. Prop head of bed up on wood or bricks to create a 6 inch incline.  Avoid gas-producing foods (eg, garlic, cabbage, legumes, onions, broccoli, brussel sprouts, wheat, and potatoes).  I have provided a handout regarding a low FODMAP diet for you.  I have also sent in simethicone for you to take 125 mg up to 4 times a day as needed.  For worsening right lower quadrant gas pain.  He is importantly work toward eating better, exercising more, and working toward weight loss.  This is crucial in treating your acid reflux.  We will follow-up in 4 months, sooner if needed.  It was a pleasure to see you today. I want to create trusting relationships with patients. If you receive a survey regarding your visit,  I greatly appreciate you taking time to fill this out on paper or through your MyChart. I value your feedback.  Venetia Night, MSN, FNP-BC, AGACNP-BC [facility]

## 2022-10-01 DIAGNOSIS — R1011 Right upper quadrant pain: Secondary | ICD-10-CM | POA: Diagnosis not present

## 2022-10-01 DIAGNOSIS — R1031 Right lower quadrant pain: Secondary | ICD-10-CM | POA: Diagnosis not present

## 2022-10-01 DIAGNOSIS — Z888 Allergy status to other drugs, medicaments and biological substances status: Secondary | ICD-10-CM | POA: Diagnosis not present

## 2022-10-01 DIAGNOSIS — K219 Gastro-esophageal reflux disease without esophagitis: Secondary | ICD-10-CM | POA: Diagnosis not present

## 2022-10-01 DIAGNOSIS — Z880 Allergy status to penicillin: Secondary | ICD-10-CM | POA: Diagnosis not present

## 2022-10-01 DIAGNOSIS — Z87891 Personal history of nicotine dependence: Secondary | ICD-10-CM | POA: Diagnosis not present

## 2022-10-01 DIAGNOSIS — R110 Nausea: Secondary | ICD-10-CM | POA: Diagnosis not present

## 2022-10-01 DIAGNOSIS — Z20822 Contact with and (suspected) exposure to covid-19: Secondary | ICD-10-CM | POA: Diagnosis not present

## 2022-10-01 DIAGNOSIS — R079 Chest pain, unspecified: Secondary | ICD-10-CM | POA: Diagnosis not present

## 2022-10-01 DIAGNOSIS — I10 Essential (primary) hypertension: Secondary | ICD-10-CM | POA: Diagnosis not present

## 2022-10-01 DIAGNOSIS — R109 Unspecified abdominal pain: Secondary | ICD-10-CM | POA: Diagnosis not present

## 2022-10-01 DIAGNOSIS — R197 Diarrhea, unspecified: Secondary | ICD-10-CM | POA: Diagnosis not present

## 2022-10-01 DIAGNOSIS — R0602 Shortness of breath: Secondary | ICD-10-CM | POA: Diagnosis not present

## 2022-10-01 DIAGNOSIS — R6889 Other general symptoms and signs: Secondary | ICD-10-CM | POA: Diagnosis not present

## 2022-10-01 DIAGNOSIS — Z743 Need for continuous supervision: Secondary | ICD-10-CM | POA: Diagnosis not present

## 2022-10-03 DIAGNOSIS — R2689 Other abnormalities of gait and mobility: Secondary | ICD-10-CM | POA: Diagnosis not present

## 2022-10-03 DIAGNOSIS — M545 Low back pain, unspecified: Secondary | ICD-10-CM | POA: Diagnosis not present

## 2022-10-03 DIAGNOSIS — M2560 Stiffness of unspecified joint, not elsewhere classified: Secondary | ICD-10-CM | POA: Diagnosis not present

## 2022-10-03 DIAGNOSIS — R29898 Other symptoms and signs involving the musculoskeletal system: Secondary | ICD-10-CM | POA: Diagnosis not present

## 2022-10-05 ENCOUNTER — Telehealth: Payer: Self-pay | Admitting: Gastroenterology

## 2022-10-05 NOTE — Telephone Encounter (Signed)
Spoke to pt and his aid and he informed me that his diarrhea has stopped.

## 2022-10-05 NOTE — Telephone Encounter (Signed)
Pt was recently by Venetia Night, NP on 09/28/2022. I received another referral on patient today for diarrhea. Does he need another OV or is this something provider could give recommendations on? Please advise. [phone]

## 2022-10-10 DIAGNOSIS — M2560 Stiffness of unspecified joint, not elsewhere classified: Secondary | ICD-10-CM | POA: Diagnosis not present

## 2022-10-10 DIAGNOSIS — R29898 Other symptoms and signs involving the musculoskeletal system: Secondary | ICD-10-CM | POA: Diagnosis not present

## 2022-10-10 DIAGNOSIS — M545 Low back pain, unspecified: Secondary | ICD-10-CM | POA: Diagnosis not present

## 2022-10-10 DIAGNOSIS — R2689 Other abnormalities of gait and mobility: Secondary | ICD-10-CM | POA: Diagnosis not present

## 2022-10-12 DIAGNOSIS — M545 Low back pain, unspecified: Secondary | ICD-10-CM | POA: Diagnosis not present

## 2022-10-12 DIAGNOSIS — R2689 Other abnormalities of gait and mobility: Secondary | ICD-10-CM | POA: Diagnosis not present

## 2022-10-12 DIAGNOSIS — M2560 Stiffness of unspecified joint, not elsewhere classified: Secondary | ICD-10-CM | POA: Diagnosis not present

## 2022-10-12 DIAGNOSIS — R29898 Other symptoms and signs involving the musculoskeletal system: Secondary | ICD-10-CM | POA: Diagnosis not present

## 2022-10-16 DIAGNOSIS — F32A Depression, unspecified: Secondary | ICD-10-CM | POA: Diagnosis not present

## 2022-10-16 DIAGNOSIS — Z886 Allergy status to analgesic agent status: Secondary | ICD-10-CM | POA: Diagnosis not present

## 2022-10-16 DIAGNOSIS — Z1152 Encounter for screening for COVID-19: Secondary | ICD-10-CM | POA: Diagnosis not present

## 2022-10-16 DIAGNOSIS — R531 Weakness: Secondary | ICD-10-CM | POA: Diagnosis not present

## 2022-10-16 DIAGNOSIS — Z20822 Contact with and (suspected) exposure to covid-19: Secondary | ICD-10-CM | POA: Diagnosis not present

## 2022-10-16 DIAGNOSIS — R6889 Other general symptoms and signs: Secondary | ICD-10-CM | POA: Diagnosis not present

## 2022-10-16 DIAGNOSIS — Z880 Allergy status to penicillin: Secondary | ICD-10-CM | POA: Diagnosis not present

## 2022-10-16 DIAGNOSIS — I959 Hypotension, unspecified: Secondary | ICD-10-CM | POA: Diagnosis not present

## 2022-10-16 DIAGNOSIS — Z79899 Other long term (current) drug therapy: Secondary | ICD-10-CM | POA: Diagnosis not present

## 2022-10-16 DIAGNOSIS — I10 Essential (primary) hypertension: Secondary | ICD-10-CM | POA: Diagnosis not present

## 2022-10-16 DIAGNOSIS — Z743 Need for continuous supervision: Secondary | ICD-10-CM | POA: Diagnosis not present

## 2022-10-16 DIAGNOSIS — R059 Cough, unspecified: Secondary | ICD-10-CM | POA: Diagnosis not present

## 2022-10-16 DIAGNOSIS — Z87891 Personal history of nicotine dependence: Secondary | ICD-10-CM | POA: Diagnosis not present

## 2022-10-16 DIAGNOSIS — K219 Gastro-esophageal reflux disease without esophagitis: Secondary | ICD-10-CM | POA: Diagnosis not present

## 2022-10-19 ENCOUNTER — Other Ambulatory Visit: Payer: Self-pay | Admitting: Pulmonary Disease

## 2022-10-19 NOTE — Telephone Encounter (Signed)
Patient needs appt before future refills

## 2022-10-21 ENCOUNTER — Other Ambulatory Visit: Payer: Self-pay | Admitting: Cardiology

## 2022-10-21 DIAGNOSIS — I10 Essential (primary) hypertension: Secondary | ICD-10-CM | POA: Diagnosis not present

## 2022-10-21 DIAGNOSIS — Z79899 Other long term (current) drug therapy: Secondary | ICD-10-CM | POA: Diagnosis not present

## 2022-10-21 DIAGNOSIS — R110 Nausea: Secondary | ICD-10-CM | POA: Diagnosis not present

## 2022-10-21 DIAGNOSIS — R42 Dizziness and giddiness: Secondary | ICD-10-CM | POA: Diagnosis not present

## 2022-10-21 DIAGNOSIS — J411 Mucopurulent chronic bronchitis: Secondary | ICD-10-CM | POA: Diagnosis not present

## 2022-10-21 DIAGNOSIS — R404 Transient alteration of awareness: Secondary | ICD-10-CM | POA: Diagnosis not present

## 2022-10-21 DIAGNOSIS — Z87891 Personal history of nicotine dependence: Secondary | ICD-10-CM | POA: Diagnosis not present

## 2022-10-21 DIAGNOSIS — J449 Chronic obstructive pulmonary disease, unspecified: Secondary | ICD-10-CM | POA: Diagnosis not present

## 2022-10-21 DIAGNOSIS — L304 Erythema intertrigo: Secondary | ICD-10-CM | POA: Diagnosis not present

## 2022-10-21 DIAGNOSIS — Z743 Need for continuous supervision: Secondary | ICD-10-CM | POA: Diagnosis not present

## 2022-10-21 DIAGNOSIS — K219 Gastro-esophageal reflux disease without esophagitis: Secondary | ICD-10-CM | POA: Diagnosis not present

## 2022-10-30 DIAGNOSIS — Z886 Allergy status to analgesic agent status: Secondary | ICD-10-CM | POA: Diagnosis not present

## 2022-10-30 DIAGNOSIS — R6889 Other general symptoms and signs: Secondary | ICD-10-CM | POA: Diagnosis not present

## 2022-10-30 DIAGNOSIS — Z888 Allergy status to other drugs, medicaments and biological substances status: Secondary | ICD-10-CM | POA: Diagnosis not present

## 2022-10-30 DIAGNOSIS — Z743 Need for continuous supervision: Secondary | ICD-10-CM | POA: Diagnosis not present

## 2022-10-30 DIAGNOSIS — Z880 Allergy status to penicillin: Secondary | ICD-10-CM | POA: Diagnosis not present

## 2022-10-30 DIAGNOSIS — J441 Chronic obstructive pulmonary disease with (acute) exacerbation: Secondary | ICD-10-CM | POA: Diagnosis not present

## 2022-10-30 DIAGNOSIS — K219 Gastro-esophageal reflux disease without esophagitis: Secondary | ICD-10-CM | POA: Diagnosis not present

## 2022-10-30 DIAGNOSIS — Z1152 Encounter for screening for COVID-19: Secondary | ICD-10-CM | POA: Diagnosis not present

## 2022-10-30 DIAGNOSIS — Z79899 Other long term (current) drug therapy: Secondary | ICD-10-CM | POA: Diagnosis not present

## 2022-10-30 DIAGNOSIS — Z9981 Dependence on supplemental oxygen: Secondary | ICD-10-CM | POA: Diagnosis not present

## 2022-10-30 DIAGNOSIS — R0902 Hypoxemia: Secondary | ICD-10-CM | POA: Diagnosis not present

## 2022-10-30 DIAGNOSIS — Z885 Allergy status to narcotic agent status: Secondary | ICD-10-CM | POA: Diagnosis not present

## 2022-10-30 DIAGNOSIS — R069 Unspecified abnormalities of breathing: Secondary | ICD-10-CM | POA: Diagnosis not present

## 2022-10-30 DIAGNOSIS — G4489 Other headache syndrome: Secondary | ICD-10-CM | POA: Diagnosis not present

## 2022-10-30 DIAGNOSIS — I10 Essential (primary) hypertension: Secondary | ICD-10-CM | POA: Diagnosis not present

## 2022-10-30 DIAGNOSIS — Z87891 Personal history of nicotine dependence: Secondary | ICD-10-CM | POA: Diagnosis not present

## 2022-10-30 DIAGNOSIS — R0600 Dyspnea, unspecified: Secondary | ICD-10-CM | POA: Diagnosis not present

## 2022-10-30 DIAGNOSIS — R0602 Shortness of breath: Secondary | ICD-10-CM | POA: Diagnosis not present

## 2022-11-02 DIAGNOSIS — E7849 Other hyperlipidemia: Secondary | ICD-10-CM | POA: Diagnosis not present

## 2022-11-02 DIAGNOSIS — G4733 Obstructive sleep apnea (adult) (pediatric): Secondary | ICD-10-CM | POA: Diagnosis not present

## 2022-11-02 DIAGNOSIS — K2100 Gastro-esophageal reflux disease with esophagitis, without bleeding: Secondary | ICD-10-CM | POA: Diagnosis not present

## 2022-11-02 DIAGNOSIS — J449 Chronic obstructive pulmonary disease, unspecified: Secondary | ICD-10-CM | POA: Diagnosis not present

## 2022-11-02 DIAGNOSIS — I10 Essential (primary) hypertension: Secondary | ICD-10-CM | POA: Diagnosis not present

## 2022-11-02 DIAGNOSIS — E1165 Type 2 diabetes mellitus with hyperglycemia: Secondary | ICD-10-CM | POA: Diagnosis not present

## 2022-11-02 DIAGNOSIS — E876 Hypokalemia: Secondary | ICD-10-CM | POA: Diagnosis not present

## 2022-11-02 DIAGNOSIS — J9611 Chronic respiratory failure with hypoxia: Secondary | ICD-10-CM | POA: Diagnosis not present

## 2022-11-21 DIAGNOSIS — J449 Chronic obstructive pulmonary disease, unspecified: Secondary | ICD-10-CM | POA: Diagnosis not present

## 2022-11-21 DIAGNOSIS — J411 Mucopurulent chronic bronchitis: Secondary | ICD-10-CM | POA: Diagnosis not present

## 2022-12-08 DIAGNOSIS — R6889 Other general symptoms and signs: Secondary | ICD-10-CM | POA: Diagnosis not present

## 2022-12-08 DIAGNOSIS — T50991A Poisoning by other drugs, medicaments and biological substances, accidental (unintentional), initial encounter: Secondary | ICD-10-CM | POA: Diagnosis not present

## 2022-12-08 DIAGNOSIS — Z91012 Allergy to eggs: Secondary | ICD-10-CM | POA: Diagnosis not present

## 2022-12-08 DIAGNOSIS — Z87891 Personal history of nicotine dependence: Secondary | ICD-10-CM | POA: Diagnosis not present

## 2022-12-08 DIAGNOSIS — Z886 Allergy status to analgesic agent status: Secondary | ICD-10-CM | POA: Diagnosis not present

## 2022-12-08 DIAGNOSIS — Z885 Allergy status to narcotic agent status: Secondary | ICD-10-CM | POA: Diagnosis not present

## 2022-12-08 DIAGNOSIS — I10 Essential (primary) hypertension: Secondary | ICD-10-CM | POA: Diagnosis not present

## 2022-12-08 DIAGNOSIS — Z888 Allergy status to other drugs, medicaments and biological substances status: Secondary | ICD-10-CM | POA: Diagnosis not present

## 2022-12-08 DIAGNOSIS — Z79899 Other long term (current) drug therapy: Secondary | ICD-10-CM | POA: Diagnosis not present

## 2022-12-08 DIAGNOSIS — J449 Chronic obstructive pulmonary disease, unspecified: Secondary | ICD-10-CM | POA: Diagnosis not present

## 2022-12-08 DIAGNOSIS — R1011 Right upper quadrant pain: Secondary | ICD-10-CM | POA: Diagnosis not present

## 2022-12-08 DIAGNOSIS — G252 Other specified forms of tremor: Secondary | ICD-10-CM | POA: Diagnosis not present

## 2022-12-08 DIAGNOSIS — T50901A Poisoning by unspecified drugs, medicaments and biological substances, accidental (unintentional), initial encounter: Secondary | ICD-10-CM | POA: Diagnosis not present

## 2022-12-08 DIAGNOSIS — K219 Gastro-esophageal reflux disease without esophagitis: Secondary | ICD-10-CM | POA: Diagnosis not present

## 2022-12-08 DIAGNOSIS — Z880 Allergy status to penicillin: Secondary | ICD-10-CM | POA: Diagnosis not present

## 2022-12-08 DIAGNOSIS — R404 Transient alteration of awareness: Secondary | ICD-10-CM | POA: Diagnosis not present

## 2022-12-08 DIAGNOSIS — F32A Depression, unspecified: Secondary | ICD-10-CM | POA: Diagnosis not present

## 2022-12-08 DIAGNOSIS — Z743 Need for continuous supervision: Secondary | ICD-10-CM | POA: Diagnosis not present

## 2022-12-13 DIAGNOSIS — F32A Depression, unspecified: Secondary | ICD-10-CM | POA: Diagnosis not present

## 2022-12-13 DIAGNOSIS — R079 Chest pain, unspecified: Secondary | ICD-10-CM | POA: Diagnosis not present

## 2022-12-13 DIAGNOSIS — Z743 Need for continuous supervision: Secondary | ICD-10-CM | POA: Diagnosis not present

## 2022-12-13 DIAGNOSIS — F17210 Nicotine dependence, cigarettes, uncomplicated: Secondary | ICD-10-CM | POA: Diagnosis not present

## 2022-12-13 DIAGNOSIS — R6889 Other general symptoms and signs: Secondary | ICD-10-CM | POA: Diagnosis not present

## 2022-12-13 DIAGNOSIS — R0689 Other abnormalities of breathing: Secondary | ICD-10-CM | POA: Diagnosis not present

## 2022-12-13 DIAGNOSIS — G4489 Other headache syndrome: Secondary | ICD-10-CM | POA: Diagnosis not present

## 2022-12-13 DIAGNOSIS — R109 Unspecified abdominal pain: Secondary | ICD-10-CM | POA: Diagnosis not present

## 2022-12-13 DIAGNOSIS — Z7985 Long-term (current) use of injectable non-insulin antidiabetic drugs: Secondary | ICD-10-CM | POA: Diagnosis not present

## 2022-12-13 DIAGNOSIS — K219 Gastro-esophageal reflux disease without esophagitis: Secondary | ICD-10-CM | POA: Diagnosis not present

## 2022-12-13 DIAGNOSIS — Z7951 Long term (current) use of inhaled steroids: Secondary | ICD-10-CM | POA: Diagnosis not present

## 2022-12-13 DIAGNOSIS — J449 Chronic obstructive pulmonary disease, unspecified: Secondary | ICD-10-CM | POA: Diagnosis not present

## 2022-12-13 DIAGNOSIS — I10 Essential (primary) hypertension: Secondary | ICD-10-CM | POA: Diagnosis not present

## 2022-12-13 DIAGNOSIS — Z888 Allergy status to other drugs, medicaments and biological substances status: Secondary | ICD-10-CM | POA: Diagnosis not present

## 2022-12-13 DIAGNOSIS — Z7983 Long term (current) use of bisphosphonates: Secondary | ICD-10-CM | POA: Diagnosis not present

## 2022-12-13 DIAGNOSIS — S39012A Strain of muscle, fascia and tendon of lower back, initial encounter: Secondary | ICD-10-CM | POA: Diagnosis not present

## 2022-12-21 DIAGNOSIS — J449 Chronic obstructive pulmonary disease, unspecified: Secondary | ICD-10-CM | POA: Diagnosis not present

## 2022-12-21 DIAGNOSIS — J411 Mucopurulent chronic bronchitis: Secondary | ICD-10-CM | POA: Diagnosis not present

## 2022-12-25 DIAGNOSIS — F32A Depression, unspecified: Secondary | ICD-10-CM | POA: Diagnosis not present

## 2022-12-25 DIAGNOSIS — Z87891 Personal history of nicotine dependence: Secondary | ICD-10-CM | POA: Diagnosis not present

## 2022-12-25 DIAGNOSIS — Z743 Need for continuous supervision: Secondary | ICD-10-CM | POA: Diagnosis not present

## 2022-12-25 DIAGNOSIS — J40 Bronchitis, not specified as acute or chronic: Secondary | ICD-10-CM | POA: Diagnosis not present

## 2022-12-25 DIAGNOSIS — R5381 Other malaise: Secondary | ICD-10-CM | POA: Diagnosis not present

## 2022-12-25 DIAGNOSIS — J019 Acute sinusitis, unspecified: Secondary | ICD-10-CM | POA: Diagnosis not present

## 2022-12-25 DIAGNOSIS — R531 Weakness: Secondary | ICD-10-CM | POA: Diagnosis not present

## 2022-12-25 DIAGNOSIS — J449 Chronic obstructive pulmonary disease, unspecified: Secondary | ICD-10-CM | POA: Diagnosis not present

## 2022-12-25 DIAGNOSIS — I10 Essential (primary) hypertension: Secondary | ICD-10-CM | POA: Diagnosis not present

## 2022-12-25 DIAGNOSIS — R0602 Shortness of breath: Secondary | ICD-10-CM | POA: Diagnosis not present

## 2022-12-25 DIAGNOSIS — J329 Chronic sinusitis, unspecified: Secondary | ICD-10-CM | POA: Diagnosis not present

## 2022-12-25 DIAGNOSIS — R6889 Other general symptoms and signs: Secondary | ICD-10-CM | POA: Diagnosis not present

## 2022-12-27 ENCOUNTER — Encounter: Payer: Self-pay | Admitting: Gastroenterology

## 2023-01-04 DIAGNOSIS — N281 Cyst of kidney, acquired: Secondary | ICD-10-CM | POA: Diagnosis not present

## 2023-01-04 DIAGNOSIS — K5730 Diverticulosis of large intestine without perforation or abscess without bleeding: Secondary | ICD-10-CM | POA: Diagnosis not present

## 2023-01-04 DIAGNOSIS — I10 Essential (primary) hypertension: Secondary | ICD-10-CM | POA: Diagnosis not present

## 2023-01-04 DIAGNOSIS — R109 Unspecified abdominal pain: Secondary | ICD-10-CM | POA: Diagnosis not present

## 2023-01-04 DIAGNOSIS — R5381 Other malaise: Secondary | ICD-10-CM | POA: Diagnosis not present

## 2023-01-04 DIAGNOSIS — Z743 Need for continuous supervision: Secondary | ICD-10-CM | POA: Diagnosis not present

## 2023-01-04 DIAGNOSIS — K219 Gastro-esophageal reflux disease without esophagitis: Secondary | ICD-10-CM | POA: Diagnosis not present

## 2023-01-04 DIAGNOSIS — R1031 Right lower quadrant pain: Secondary | ICD-10-CM | POA: Diagnosis not present

## 2023-01-04 DIAGNOSIS — Z87891 Personal history of nicotine dependence: Secondary | ICD-10-CM | POA: Diagnosis not present

## 2023-01-06 DIAGNOSIS — Z886 Allergy status to analgesic agent status: Secondary | ICD-10-CM | POA: Diagnosis not present

## 2023-01-06 DIAGNOSIS — R531 Weakness: Secondary | ICD-10-CM | POA: Diagnosis not present

## 2023-01-06 DIAGNOSIS — Z885 Allergy status to narcotic agent status: Secondary | ICD-10-CM | POA: Diagnosis not present

## 2023-01-06 DIAGNOSIS — R109 Unspecified abdominal pain: Secondary | ICD-10-CM | POA: Diagnosis not present

## 2023-01-06 DIAGNOSIS — R0600 Dyspnea, unspecified: Secondary | ICD-10-CM | POA: Diagnosis not present

## 2023-01-06 DIAGNOSIS — Z743 Need for continuous supervision: Secondary | ICD-10-CM | POA: Diagnosis not present

## 2023-01-06 DIAGNOSIS — Z91040 Latex allergy status: Secondary | ICD-10-CM | POA: Diagnosis not present

## 2023-01-06 DIAGNOSIS — Z91012 Allergy to eggs: Secondary | ICD-10-CM | POA: Diagnosis not present

## 2023-01-06 DIAGNOSIS — Z91030 Bee allergy status: Secondary | ICD-10-CM | POA: Diagnosis not present

## 2023-01-06 DIAGNOSIS — E860 Dehydration: Secondary | ICD-10-CM | POA: Diagnosis not present

## 2023-01-06 DIAGNOSIS — Z87891 Personal history of nicotine dependence: Secondary | ICD-10-CM | POA: Diagnosis not present

## 2023-01-06 DIAGNOSIS — K219 Gastro-esophageal reflux disease without esophagitis: Secondary | ICD-10-CM | POA: Diagnosis not present

## 2023-01-06 DIAGNOSIS — I10 Essential (primary) hypertension: Secondary | ICD-10-CM | POA: Diagnosis not present

## 2023-01-06 DIAGNOSIS — R059 Cough, unspecified: Secondary | ICD-10-CM | POA: Diagnosis not present

## 2023-01-06 DIAGNOSIS — R300 Dysuria: Secondary | ICD-10-CM | POA: Diagnosis not present

## 2023-01-06 DIAGNOSIS — J45901 Unspecified asthma with (acute) exacerbation: Secondary | ICD-10-CM | POA: Diagnosis not present

## 2023-01-06 DIAGNOSIS — Z5321 Procedure and treatment not carried out due to patient leaving prior to being seen by health care provider: Secondary | ICD-10-CM | POA: Diagnosis not present

## 2023-01-06 DIAGNOSIS — J449 Chronic obstructive pulmonary disease, unspecified: Secondary | ICD-10-CM | POA: Diagnosis not present

## 2023-01-06 DIAGNOSIS — Z880 Allergy status to penicillin: Secondary | ICD-10-CM | POA: Diagnosis not present

## 2023-01-06 DIAGNOSIS — Z7983 Long term (current) use of bisphosphonates: Secondary | ICD-10-CM | POA: Diagnosis not present

## 2023-01-06 DIAGNOSIS — J209 Acute bronchitis, unspecified: Secondary | ICD-10-CM | POA: Diagnosis not present

## 2023-01-06 DIAGNOSIS — F32A Depression, unspecified: Secondary | ICD-10-CM | POA: Diagnosis not present

## 2023-01-06 DIAGNOSIS — Z7951 Long term (current) use of inhaled steroids: Secondary | ICD-10-CM | POA: Diagnosis not present

## 2023-01-06 DIAGNOSIS — Z888 Allergy status to other drugs, medicaments and biological substances status: Secondary | ICD-10-CM | POA: Diagnosis not present

## 2023-01-07 DIAGNOSIS — Z87891 Personal history of nicotine dependence: Secondary | ICD-10-CM | POA: Diagnosis not present

## 2023-01-07 DIAGNOSIS — R6889 Other general symptoms and signs: Secondary | ICD-10-CM | POA: Diagnosis not present

## 2023-01-07 DIAGNOSIS — R059 Cough, unspecified: Secondary | ICD-10-CM | POA: Diagnosis not present

## 2023-01-07 DIAGNOSIS — Z743 Need for continuous supervision: Secondary | ICD-10-CM | POA: Diagnosis not present

## 2023-01-07 DIAGNOSIS — J449 Chronic obstructive pulmonary disease, unspecified: Secondary | ICD-10-CM | POA: Diagnosis not present

## 2023-01-07 DIAGNOSIS — R0682 Tachypnea, not elsewhere classified: Secondary | ICD-10-CM | POA: Diagnosis not present

## 2023-01-07 DIAGNOSIS — R0600 Dyspnea, unspecified: Secondary | ICD-10-CM | POA: Diagnosis not present

## 2023-01-07 DIAGNOSIS — I499 Cardiac arrhythmia, unspecified: Secondary | ICD-10-CM | POA: Diagnosis not present

## 2023-01-07 DIAGNOSIS — J209 Acute bronchitis, unspecified: Secondary | ICD-10-CM | POA: Diagnosis not present

## 2023-01-14 DIAGNOSIS — Z91012 Allergy to eggs: Secondary | ICD-10-CM | POA: Diagnosis not present

## 2023-01-14 DIAGNOSIS — Z888 Allergy status to other drugs, medicaments and biological substances status: Secondary | ICD-10-CM | POA: Diagnosis not present

## 2023-01-14 DIAGNOSIS — R0602 Shortness of breath: Secondary | ICD-10-CM | POA: Diagnosis not present

## 2023-01-14 DIAGNOSIS — I499 Cardiac arrhythmia, unspecified: Secondary | ICD-10-CM | POA: Diagnosis not present

## 2023-01-14 DIAGNOSIS — J449 Chronic obstructive pulmonary disease, unspecified: Secondary | ICD-10-CM | POA: Diagnosis not present

## 2023-01-14 DIAGNOSIS — Z880 Allergy status to penicillin: Secondary | ICD-10-CM | POA: Diagnosis not present

## 2023-01-14 DIAGNOSIS — Z7902 Long term (current) use of antithrombotics/antiplatelets: Secondary | ICD-10-CM | POA: Diagnosis not present

## 2023-01-14 DIAGNOSIS — Z886 Allergy status to analgesic agent status: Secondary | ICD-10-CM | POA: Diagnosis not present

## 2023-01-14 DIAGNOSIS — R059 Cough, unspecified: Secondary | ICD-10-CM | POA: Diagnosis not present

## 2023-01-14 DIAGNOSIS — F32A Depression, unspecified: Secondary | ICD-10-CM | POA: Diagnosis not present

## 2023-01-14 DIAGNOSIS — K219 Gastro-esophageal reflux disease without esophagitis: Secondary | ICD-10-CM | POA: Diagnosis not present

## 2023-01-14 DIAGNOSIS — R531 Weakness: Secondary | ICD-10-CM | POA: Diagnosis not present

## 2023-01-14 DIAGNOSIS — Z743 Need for continuous supervision: Secondary | ICD-10-CM | POA: Diagnosis not present

## 2023-01-14 DIAGNOSIS — Z87891 Personal history of nicotine dependence: Secondary | ICD-10-CM | POA: Diagnosis not present

## 2023-01-14 DIAGNOSIS — I10 Essential (primary) hypertension: Secondary | ICD-10-CM | POA: Diagnosis not present

## 2023-01-14 DIAGNOSIS — Z91040 Latex allergy status: Secondary | ICD-10-CM | POA: Diagnosis not present

## 2023-01-14 DIAGNOSIS — Z885 Allergy status to narcotic agent status: Secondary | ICD-10-CM | POA: Diagnosis not present

## 2023-01-14 DIAGNOSIS — R300 Dysuria: Secondary | ICD-10-CM | POA: Diagnosis not present

## 2023-01-14 DIAGNOSIS — R6889 Other general symptoms and signs: Secondary | ICD-10-CM | POA: Diagnosis not present

## 2023-01-14 DIAGNOSIS — R079 Chest pain, unspecified: Secondary | ICD-10-CM | POA: Diagnosis not present

## 2023-01-14 DIAGNOSIS — Z79899 Other long term (current) drug therapy: Secondary | ICD-10-CM | POA: Diagnosis not present

## 2023-01-19 DIAGNOSIS — F32A Depression, unspecified: Secondary | ICD-10-CM | POA: Diagnosis not present

## 2023-01-19 DIAGNOSIS — M549 Dorsalgia, unspecified: Secondary | ICD-10-CM | POA: Diagnosis not present

## 2023-01-19 DIAGNOSIS — Z885 Allergy status to narcotic agent status: Secondary | ICD-10-CM | POA: Diagnosis not present

## 2023-01-19 DIAGNOSIS — R1084 Generalized abdominal pain: Secondary | ICD-10-CM | POA: Diagnosis not present

## 2023-01-19 DIAGNOSIS — Z7951 Long term (current) use of inhaled steroids: Secondary | ICD-10-CM | POA: Diagnosis not present

## 2023-01-19 DIAGNOSIS — I10 Essential (primary) hypertension: Secondary | ICD-10-CM | POA: Diagnosis not present

## 2023-01-19 DIAGNOSIS — Z91012 Allergy to eggs: Secondary | ICD-10-CM | POA: Diagnosis not present

## 2023-01-19 DIAGNOSIS — Z87891 Personal history of nicotine dependence: Secondary | ICD-10-CM | POA: Diagnosis not present

## 2023-01-19 DIAGNOSIS — R112 Nausea with vomiting, unspecified: Secondary | ICD-10-CM | POA: Diagnosis not present

## 2023-01-19 DIAGNOSIS — M545 Low back pain, unspecified: Secondary | ICD-10-CM | POA: Diagnosis not present

## 2023-01-19 DIAGNOSIS — Z91040 Latex allergy status: Secondary | ICD-10-CM | POA: Diagnosis not present

## 2023-01-19 DIAGNOSIS — Z880 Allergy status to penicillin: Secondary | ICD-10-CM | POA: Diagnosis not present

## 2023-01-19 DIAGNOSIS — K219 Gastro-esophageal reflux disease without esophagitis: Secondary | ICD-10-CM | POA: Diagnosis not present

## 2023-01-19 DIAGNOSIS — Z91030 Bee allergy status: Secondary | ICD-10-CM | POA: Diagnosis not present

## 2023-01-19 DIAGNOSIS — J449 Chronic obstructive pulmonary disease, unspecified: Secondary | ICD-10-CM | POA: Diagnosis not present

## 2023-01-19 DIAGNOSIS — G8929 Other chronic pain: Secondary | ICD-10-CM | POA: Diagnosis not present

## 2023-01-19 DIAGNOSIS — Z888 Allergy status to other drugs, medicaments and biological substances status: Secondary | ICD-10-CM | POA: Diagnosis not present

## 2023-01-19 DIAGNOSIS — Z743 Need for continuous supervision: Secondary | ICD-10-CM | POA: Diagnosis not present

## 2023-01-19 DIAGNOSIS — Z886 Allergy status to analgesic agent status: Secondary | ICD-10-CM | POA: Diagnosis not present

## 2023-01-19 DIAGNOSIS — Z5329 Procedure and treatment not carried out because of patient's decision for other reasons: Secondary | ICD-10-CM | POA: Diagnosis not present

## 2023-01-19 DIAGNOSIS — R197 Diarrhea, unspecified: Secondary | ICD-10-CM | POA: Diagnosis not present

## 2023-01-19 DIAGNOSIS — Z79899 Other long term (current) drug therapy: Secondary | ICD-10-CM | POA: Diagnosis not present

## 2023-01-21 DIAGNOSIS — J449 Chronic obstructive pulmonary disease, unspecified: Secondary | ICD-10-CM | POA: Diagnosis not present

## 2023-01-21 DIAGNOSIS — J411 Mucopurulent chronic bronchitis: Secondary | ICD-10-CM | POA: Diagnosis not present

## 2023-01-22 DIAGNOSIS — Z886 Allergy status to analgesic agent status: Secondary | ICD-10-CM | POA: Diagnosis not present

## 2023-01-22 DIAGNOSIS — Z888 Allergy status to other drugs, medicaments and biological substances status: Secondary | ICD-10-CM | POA: Diagnosis not present

## 2023-01-22 DIAGNOSIS — R6889 Other general symptoms and signs: Secondary | ICD-10-CM | POA: Diagnosis not present

## 2023-01-22 DIAGNOSIS — R197 Diarrhea, unspecified: Secondary | ICD-10-CM | POA: Diagnosis not present

## 2023-01-22 DIAGNOSIS — Z87891 Personal history of nicotine dependence: Secondary | ICD-10-CM | POA: Diagnosis not present

## 2023-01-22 DIAGNOSIS — Z885 Allergy status to narcotic agent status: Secondary | ICD-10-CM | POA: Diagnosis not present

## 2023-01-22 DIAGNOSIS — R300 Dysuria: Secondary | ICD-10-CM | POA: Diagnosis not present

## 2023-01-22 DIAGNOSIS — R109 Unspecified abdominal pain: Secondary | ICD-10-CM | POA: Diagnosis not present

## 2023-01-22 DIAGNOSIS — Z743 Need for continuous supervision: Secondary | ICD-10-CM | POA: Diagnosis not present

## 2023-01-22 DIAGNOSIS — Z880 Allergy status to penicillin: Secondary | ICD-10-CM | POA: Diagnosis not present

## 2023-01-22 DIAGNOSIS — R3129 Other microscopic hematuria: Secondary | ICD-10-CM | POA: Diagnosis not present

## 2023-01-24 DIAGNOSIS — J449 Chronic obstructive pulmonary disease, unspecified: Secondary | ICD-10-CM | POA: Diagnosis not present

## 2023-01-24 DIAGNOSIS — E1165 Type 2 diabetes mellitus with hyperglycemia: Secondary | ICD-10-CM | POA: Diagnosis not present

## 2023-01-24 DIAGNOSIS — E876 Hypokalemia: Secondary | ICD-10-CM | POA: Diagnosis not present

## 2023-01-24 DIAGNOSIS — K2100 Gastro-esophageal reflux disease with esophagitis, without bleeding: Secondary | ICD-10-CM | POA: Diagnosis not present

## 2023-01-24 DIAGNOSIS — I10 Essential (primary) hypertension: Secondary | ICD-10-CM | POA: Diagnosis not present

## 2023-01-24 DIAGNOSIS — E7849 Other hyperlipidemia: Secondary | ICD-10-CM | POA: Diagnosis not present

## 2023-01-24 DIAGNOSIS — J9611 Chronic respiratory failure with hypoxia: Secondary | ICD-10-CM | POA: Diagnosis not present

## 2023-01-24 DIAGNOSIS — G4733 Obstructive sleep apnea (adult) (pediatric): Secondary | ICD-10-CM | POA: Diagnosis not present

## 2023-01-26 ENCOUNTER — Telehealth: Payer: Self-pay | Admitting: *Deleted

## 2023-01-26 NOTE — Progress Notes (Signed)
  Care Coordination  Outreach Note  01/26/2023 Name: Charles Mathews MRN: [id number] DOB: [birth year]   Care Coordination Outreach Attempts: An unsuccessful telephone outreach was attempted today to offer the patient information about available care coordination services.  Follow Up Plan:  Additional outreach attempts will be made to offer the patient care coordination information and services.   Encounter Outcome:  No Answer  Gwenevere Ghazi  Care Coordination Care Guide  Direct Dial: [phone]

## 2023-01-27 DIAGNOSIS — R6889 Other general symptoms and signs: Secondary | ICD-10-CM | POA: Diagnosis not present

## 2023-01-27 DIAGNOSIS — Z743 Need for continuous supervision: Secondary | ICD-10-CM | POA: Diagnosis not present

## 2023-01-31 NOTE — Progress Notes (Signed)
  Care Coordination  Outreach Note  01/31/2023 Name: AUNDRAE BATOR MRN: [id number] DOB: Dec 12, 1960   Care Coordination Outreach Attempts: A second unsuccessful outreach was attempted today to offer the patient with information about available care coordination services.  Follow Up Plan:  Additional outreach attempts will be made to offer the patient care coordination information and services.   Encounter Outcome:  No Answer  Christie Nottingham  Care Coordination Care Guide  Direct Dial: [phone]

## 2023-02-03 NOTE — Progress Notes (Signed)
  Care Coordination   Note   02/03/2023 Name: Charles Mathews MRN: [id number] DOB: [birth year]  [name] is a 62 y.o. year old male who sees [name], MD for primary care. I reached out to Natividad Brood by phone today to offer care coordination services.  Mr. Lampman was given information about Care Coordination services today including:   The Care Coordination services include support from the care team which includes your Nurse Coordinator, Clinical Social Worker, or Pharmacist.  The Care Coordination team is here to help remove barriers to the health concerns and goals most important to you. Care Coordination services are voluntary, and the patient may decline or stop services at any time by request to their care team member.   Care Coordination Consent Status: Patient POA Jeanelle Malling agreed to services and verbal consent obtained. Liborio Nixon would like Care giver [name] called DPR on file   Follow up plan:  Telephone appointment with care coordination team member scheduled for:  02/08/23  Encounter Outcome:  Pt. Scheduled  [facility] Coordination Care Guide  Direct Dial: [phone]

## 2023-02-04 DIAGNOSIS — Z886 Allergy status to analgesic agent status: Secondary | ICD-10-CM | POA: Diagnosis not present

## 2023-02-04 DIAGNOSIS — Z91040 Latex allergy status: Secondary | ICD-10-CM | POA: Diagnosis not present

## 2023-02-04 DIAGNOSIS — Z20822 Contact with and (suspected) exposure to covid-19: Secondary | ICD-10-CM | POA: Diagnosis not present

## 2023-02-04 DIAGNOSIS — Z885 Allergy status to narcotic agent status: Secondary | ICD-10-CM | POA: Diagnosis not present

## 2023-02-04 DIAGNOSIS — I10 Essential (primary) hypertension: Secondary | ICD-10-CM | POA: Diagnosis not present

## 2023-02-04 DIAGNOSIS — Z880 Allergy status to penicillin: Secondary | ICD-10-CM | POA: Diagnosis not present

## 2023-02-04 DIAGNOSIS — K529 Noninfective gastroenteritis and colitis, unspecified: Secondary | ICD-10-CM | POA: Diagnosis not present

## 2023-02-04 DIAGNOSIS — Z91030 Bee allergy status: Secondary | ICD-10-CM | POA: Diagnosis not present

## 2023-02-04 DIAGNOSIS — Z888 Allergy status to other drugs, medicaments and biological substances status: Secondary | ICD-10-CM | POA: Diagnosis not present

## 2023-02-04 DIAGNOSIS — R112 Nausea with vomiting, unspecified: Secondary | ICD-10-CM | POA: Diagnosis not present

## 2023-02-04 DIAGNOSIS — Z79899 Other long term (current) drug therapy: Secondary | ICD-10-CM | POA: Diagnosis not present

## 2023-02-04 DIAGNOSIS — R197 Diarrhea, unspecified: Secondary | ICD-10-CM | POA: Diagnosis not present

## 2023-02-04 DIAGNOSIS — Z91012 Allergy to eggs: Secondary | ICD-10-CM | POA: Diagnosis not present

## 2023-02-04 DIAGNOSIS — Z743 Need for continuous supervision: Secondary | ICD-10-CM | POA: Diagnosis not present

## 2023-02-04 DIAGNOSIS — J449 Chronic obstructive pulmonary disease, unspecified: Secondary | ICD-10-CM | POA: Diagnosis not present

## 2023-02-08 ENCOUNTER — Ambulatory Visit: Payer: Self-pay | Admitting: *Deleted

## 2023-02-08 ENCOUNTER — Encounter: Payer: Self-pay | Admitting: *Deleted

## 2023-02-08 DIAGNOSIS — K2100 Gastro-esophageal reflux disease with esophagitis, without bleeding: Secondary | ICD-10-CM | POA: Diagnosis not present

## 2023-02-08 DIAGNOSIS — E1165 Type 2 diabetes mellitus with hyperglycemia: Secondary | ICD-10-CM | POA: Diagnosis not present

## 2023-02-08 DIAGNOSIS — J449 Chronic obstructive pulmonary disease, unspecified: Secondary | ICD-10-CM | POA: Diagnosis not present

## 2023-02-08 DIAGNOSIS — E876 Hypokalemia: Secondary | ICD-10-CM | POA: Diagnosis not present

## 2023-02-08 DIAGNOSIS — E7849 Other hyperlipidemia: Secondary | ICD-10-CM | POA: Diagnosis not present

## 2023-02-08 DIAGNOSIS — I10 Essential (primary) hypertension: Secondary | ICD-10-CM | POA: Diagnosis not present

## 2023-02-08 DIAGNOSIS — G4733 Obstructive sleep apnea (adult) (pediatric): Secondary | ICD-10-CM | POA: Diagnosis not present

## 2023-02-08 DIAGNOSIS — J9611 Chronic respiratory failure with hypoxia: Secondary | ICD-10-CM | POA: Diagnosis not present

## 2023-02-08 NOTE — Patient Outreach (Signed)
Care Coordination   Initial Visit Note   02/08/2023 Name: Charles Mathews MRN: [id number] DOB: 11-Sep-1960  Charles Mathews is a 62 y.o. year old male who sees [name], MD for primary care. I  I spoke with caregiver, [name], by telephone today with patient present during conversation.   What matters to the patients health and wellness today?  Managing COPD and health related anxiety    Goals Addressed             This Visit's Progress    Care Coordination Services-Decrease ED Visits       Care Coordination Goals: Patient will take medications as prescribed Patient will follow-up with PCP every 3 months or sooner if needed Patient will follow-up with specialists as recommended Patient will continue Follow-up with [facility] counselor weekly Patient will continue to follow-up with [facility] Pyschologist monthly Patient will utilize Urgent Care or PCP services for acute, non-emergency illnesses or medical concerns and will reserve emergency room visits for medical emergencies Patient will reach out to caregiver, [name], for transportation as needed Patient will reach out to PCP with any new or worsening symptoms  Patient/caregiver will reach out to RN Care Coordinator at [phone] with any resource or care coordination needs      Manage COPD       Care Coordination Goals: Patient will keep all follow-up PCP and/or pulmonary appointments Patient will call provider with any new or worsening symptoms Patient will track and manage COPD triggers Patient will use medications as directed by PCP or pulmonologist  Patient will engage in light exercise as tolerated 3-5 days a week to aid in the the management of COPD Provided will use infection prevention strategies to reduce risk of respiratory infection Patient will state understanding of the importance of adequate rest and management of fatigue with COPD Patient will continue to use continuous O2 at 4 L per minute (or as directed by  provider) per [place] via floor concentrator or portable tanks Patient will call RN Care Coordinator [phone] with any care coordination or resource needs         SDOH assessments and interventions completed:  Yes  SDOH Interventions Today    Flowsheet Row Most Recent Value  SDOH Interventions   Housing Interventions Intervention Not Indicated  Transportation Interventions Patient Resources (Friends/Family)  Utilities Interventions Intervention Not Indicated  Financial Strain Interventions Intervention Not Indicated  Physical Activity Interventions Patient Declined        Care Coordination Interventions:  Yes, provided  Interventions Today    Flowsheet Row Most Recent Value  Chronic Disease   Chronic disease during today's visit Chronic Obstructive Pulmonary Disease (COPD)  General Interventions   General Interventions Discussed/Reviewed General Interventions Discussed, General Interventions Reviewed, Durable Medical Equipment (DME), Doctor Visits  Doctor Visits Discussed/Reviewed Doctor Visits Discussed, Doctor Visits Reviewed, PCP, Specialist  [Patient had PCP visit today & cardio scheduled for 03/06/23]  Durable Medical Equipment (DME) Oxygen  PCP/Specialist Visits Compliance with follow-up visit  Exercise Interventions   Exercise Discussed/Reviewed Physical Activity, Exercise Reviewed, Exercise Discussed  [Encouraged light exercise at least 3 days per week and to increase as tolerated]  Physical Activity Discussed/Reviewed Physical Activity Discussed, Physical Activity Reviewed  [Pt has COPD and does get SOB with exertion but is also deconditoined, per caregiver]  Education Interventions   Education Provided Provided Education  Provided Verbal Education On Nutrition, Mental Health/Coping with Illness, When to see the doctor, Other, [employer], Medication, Exercise  [Utilize PCP and  urgent care for non-emergency medical management. Reserve ED for emergencies. Continue  to work with [employer] re: anxiety.]  Mental Health Interventions   Mental Health Discussed/Reviewed Mental Health Discussed, Mental Health Reviewed, Anxiety  Lynnea Maizes with [facility] for counseling and psych services]  Nutrition Interventions   Nutrition Discussed/Reviewed Nutrition Discussed, Nutrition Reviewed  Pharmacy Interventions   Pharmacy Dicussed/Reviewed Pharmacy Topics Discussed, Pharmacy Topics Reviewed, Medications and their functions, Medication Adherence  Safety Interventions   Safety Discussed/Reviewed Safety Discussed, Safety Reviewed, Fall Risk, Home Safety       Follow up plan: Follow up call scheduled for 02/21/23    Encounter Outcome:  Pt. Visit Completed   [name], BSN, RN-BC RN Care Coordinator [facility]  [facility] Direct Dial: [phone] Main #: [phone]

## 2023-02-10 DIAGNOSIS — G9389 Other specified disorders of brain: Secondary | ICD-10-CM | POA: Diagnosis not present

## 2023-02-10 DIAGNOSIS — R42 Dizziness and giddiness: Secondary | ICD-10-CM | POA: Diagnosis not present

## 2023-02-10 DIAGNOSIS — J449 Chronic obstructive pulmonary disease, unspecified: Secondary | ICD-10-CM | POA: Diagnosis not present

## 2023-02-10 DIAGNOSIS — Z79899 Other long term (current) drug therapy: Secondary | ICD-10-CM | POA: Diagnosis not present

## 2023-02-10 DIAGNOSIS — E860 Dehydration: Secondary | ICD-10-CM | POA: Diagnosis not present

## 2023-02-10 DIAGNOSIS — Z743 Need for continuous supervision: Secondary | ICD-10-CM | POA: Diagnosis not present

## 2023-02-10 DIAGNOSIS — R6889 Other general symptoms and signs: Secondary | ICD-10-CM | POA: Diagnosis not present

## 2023-02-10 DIAGNOSIS — Z888 Allergy status to other drugs, medicaments and biological substances status: Secondary | ICD-10-CM | POA: Diagnosis not present

## 2023-02-10 DIAGNOSIS — R1011 Right upper quadrant pain: Secondary | ICD-10-CM | POA: Diagnosis not present

## 2023-02-10 DIAGNOSIS — R6250 Unspecified lack of expected normal physiological development in childhood: Secondary | ICD-10-CM | POA: Diagnosis not present

## 2023-02-10 DIAGNOSIS — R531 Weakness: Secondary | ICD-10-CM | POA: Diagnosis not present

## 2023-02-10 DIAGNOSIS — Z880 Allergy status to penicillin: Secondary | ICD-10-CM | POA: Diagnosis not present

## 2023-02-10 DIAGNOSIS — R404 Transient alteration of awareness: Secondary | ICD-10-CM | POA: Diagnosis not present

## 2023-02-10 DIAGNOSIS — Z794 Long term (current) use of insulin: Secondary | ICD-10-CM | POA: Diagnosis not present

## 2023-02-10 DIAGNOSIS — R109 Unspecified abdominal pain: Secondary | ICD-10-CM | POA: Diagnosis not present

## 2023-02-10 DIAGNOSIS — I10 Essential (primary) hypertension: Secondary | ICD-10-CM | POA: Diagnosis not present

## 2023-02-10 DIAGNOSIS — Z885 Allergy status to narcotic agent status: Secondary | ICD-10-CM | POA: Diagnosis not present

## 2023-02-10 DIAGNOSIS — R079 Chest pain, unspecified: Secondary | ICD-10-CM | POA: Diagnosis not present

## 2023-02-10 DIAGNOSIS — Z87891 Personal history of nicotine dependence: Secondary | ICD-10-CM | POA: Diagnosis not present

## 2023-02-10 DIAGNOSIS — Z886 Allergy status to analgesic agent status: Secondary | ICD-10-CM | POA: Diagnosis not present

## 2023-02-20 DIAGNOSIS — J411 Mucopurulent chronic bronchitis: Secondary | ICD-10-CM | POA: Diagnosis not present

## 2023-02-20 DIAGNOSIS — J449 Chronic obstructive pulmonary disease, unspecified: Secondary | ICD-10-CM | POA: Diagnosis not present

## 2023-02-21 ENCOUNTER — Ambulatory Visit: Payer: Self-pay | Admitting: *Deleted

## 2023-02-21 ENCOUNTER — Encounter: Payer: Self-pay | Admitting: *Deleted

## 2023-02-21 NOTE — Patient Outreach (Signed)
Care Coordination   Follow Up Visit Note   02/21/2023 Name: Charles Mathews MRN: [id number] DOB: Dec 14, 1960  Charles Mathews is a 62 y.o. year old male who sees [name], MD for primary care. I  spoke with caregiver, [name], by telephone today.  What matters to the patients health and wellness today? Caregiver would like to decrease ED visits and for patient to call her if he's feeling anxious when he's not there   Goals Addressed             This Visit's Progress    Care Coordination Services-Decrease ED Visits   Not on track    Care Coordination Goals: Patient will take medications as prescribed Patient will follow-up with PCP every 3 months or sooner if needed Patient will follow-up with specialists as recommended Patient will continue Follow-up with [facility] counselor weekly Patient will continue to follow-up with [facility] Pyschiatry monthly Patient will utilize Urgent Care or PCP services for acute, non-emergency illnesses or medical concerns and will reserve emergency room visits for medical emergencies Patient will reach out to caregiver, [name], for transportation as needed Patient will reach out to PCP with any new or worsening symptoms  Patient/caregiver will reach out to RN Care Coordinator at [phone] with any resource or care coordination needs      Manage COPD   On track    Care Coordination Goals: Patient will keep all follow-up PCP and/or pulmonary appointments Patient will call provider with any new or worsening symptoms Patient will track and manage COPD triggers Patient will use medications as directed by PCP or pulmonologist  Patient will use infection prevention strategies to reduce risk of respiratory infection Patient/caregiver will refer to handout on COPD Zones as needed Patient will continue to use continuous O2 at 4 L per minute (or as directed by provider) per [place] via floor concentrator or portable tanks Patient will call RN Care Coordinator  [phone] with any care coordination or resource needs         SDOH assessments and interventions completed:  Yes  SDOH Interventions Today    Flowsheet Row Most Recent Value  SDOH Interventions   Food Insecurity Interventions Intervention Not Indicated  Housing Interventions Intervention Not Indicated  Transportation Interventions Patient Resources (Friends/Family)  Financial Strain Interventions Intervention Not Indicated        Care Coordination Interventions:  Yes, provided  Interventions Today    Flowsheet Row Most Recent Value  Chronic Disease   Chronic disease during today's visit Chronic Obstructive Pulmonary Disease (COPD)  General Interventions   General Interventions Discussed/Reviewed General Interventions Discussed, General Interventions Reviewed, Doctor Visits, Communication with, Level of Care  [Reviewed and discussed ED visits with caregiver, [name].]  Doctor Visits Discussed/Reviewed PCP, Doctor Visits Discussed, Doctor Visits Reviewed, Specialist  [PCP visit last week]  Durable Medical Equipment (DME) Oxygen  [4L O2 continuous]  PCP/Specialist Visits Compliance with follow-up visit  [cardiology appt scheduled for 03/06/23]  Communication with Social Work, PCP/Specialists  [Will discuss with LCSW and other Care Management Team Members at weekly huddle on Thursday. May need to escalate to Medical Director, Dr Yetta Flock, due to frequency of ED visits.]  Level of Care Personal Care Services  [Patient has an aide that helps him daily with ADLs and transportation. He has lived in in the past for a brief time but he likes to have his own space that is quiet. That living situation made him more anxious. No living family, except for an 7 yo  aunt.]  Exercise Interventions   Exercise Discussed/Reviewed Exercise Discussed, Exercise Reviewed, Physical Activity  [Encouraged light exercises at least 3 times per week]  Physical Activity Discussed/Reviewed Physical Activity  Discussed, Physical Activity Reviewed  [Pt has COPD and does get SOB with exertion but is also deconditoined, per caregiver]  Education Interventions   Education Provided Provided Education, Provided Printed Education  Provided Verbal Education On When to see the doctor, Sick Day Rules  [Utilize PCP and urgent care for non-emergency medical management. Reserve ED for emergencies. Continue to work with [employer] re: anxiety, handout on COPD Action Zones]  Mental Health Interventions   Mental Health Discussed/Reviewed Mental Health Discussed, Mental Health Reviewed, Anxiety  Lynnea Maizes w/ [facility] for counseling and psych services & follows up with them regularly. Has a developmental delay but is able to live independently w/ an in-home-aide Mon-Fri 8-5 & somewhere to manage money]  Nutrition Interventions   Nutrition Discussed/Reviewed Nutrition Discussed, Nutrition Reviewed  [aide prepares meals and patient can reheat or fix a snack]  Safety Interventions   Safety Discussed/Reviewed Safety Discussed, Safety Reviewed, Fall Risk, Home Safety  Home Safety --  [Has an in-home-aide mon-fri 8-5 & they are trying to get additional hours. Has someone appointed to manage his finances.]       Follow up plan: Follow up call scheduled for 03/01/23    Encounter Outcome:  Pt. Visit Completed   [name], BSN, RN-BC RN Care Coordinator [facility]  [facility] Direct Dial: [phone] Main #: [phone]

## 2023-02-22 DIAGNOSIS — E7849 Other hyperlipidemia: Secondary | ICD-10-CM | POA: Diagnosis not present

## 2023-02-22 DIAGNOSIS — K219 Gastro-esophageal reflux disease without esophagitis: Secondary | ICD-10-CM | POA: Diagnosis not present

## 2023-02-22 DIAGNOSIS — N183 Chronic kidney disease, stage 3 unspecified: Secondary | ICD-10-CM | POA: Diagnosis not present

## 2023-02-22 DIAGNOSIS — E1122 Type 2 diabetes mellitus with diabetic chronic kidney disease: Secondary | ICD-10-CM | POA: Diagnosis not present

## 2023-02-22 DIAGNOSIS — E1142 Type 2 diabetes mellitus with diabetic polyneuropathy: Secondary | ICD-10-CM | POA: Diagnosis not present

## 2023-02-22 DIAGNOSIS — E876 Hypokalemia: Secondary | ICD-10-CM | POA: Diagnosis not present

## 2023-02-22 DIAGNOSIS — R5383 Other fatigue: Secondary | ICD-10-CM | POA: Diagnosis not present

## 2023-02-22 DIAGNOSIS — Z1329 Encounter for screening for other suspected endocrine disorder: Secondary | ICD-10-CM | POA: Diagnosis not present

## 2023-02-23 ENCOUNTER — Encounter: Payer: Self-pay | Admitting: *Deleted

## 2023-02-23 ENCOUNTER — Telehealth: Payer: Self-pay | Admitting: *Deleted

## 2023-02-24 ENCOUNTER — Ambulatory Visit: Payer: Self-pay | Admitting: *Deleted

## 2023-02-24 ENCOUNTER — Encounter: Payer: Self-pay | Admitting: *Deleted
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
## 2023-02-24 NOTE — Patient Outreach (Signed)
Care Coordination   Complex Case Discussion  Note   02/23/2023 Name: Charles Mathews MRN: [id number] DOB: 16-Oct-1960  Charles Mathews is a 62 y.o. year old male who sees Charles Chimera, MD for primary care. I  collaborated with [employer] Mathews, [employer], LCSW today for a complex case discussion. Patient has had 10 ED visits in the past Mathews months for minor concerns. Caregiver endorses that he gets anxious when he's alone and will call 911 and be transported to the hospital. He has been discharged in stable condition each time. Complaints are likely appropriate for Urgent Care or PCP visits.   May need to escalate to Medical Director. Will wait for LCSW assessment.    Goals Addressed             This Visit's Progress    Care Coordination Services-Decrease ED Visits   Not on track    Care Coordination Goals: Patient will take medications as prescribed Patient will follow-up with PCP every 3 months or sooner if needed Patient will follow-up with specialists as recommended Patient will continue Follow-up with Charles Mathews counselor weekly Patient will continue to follow-up with Charles Mathews Pyschi[facility] monthly Patient will utilize Urgent Care or PCP services for acute, non-emergency illnesses or medical concerns and will reserve emergency room visits for medical emergencies Patient will reach out to caregiver, Charles Mathews, for transportation as needed Patient will reach out to PCP with any new or worsening symptoms  Patient/caregiver will talk with LCSW regarding level of care concerns and health related anxiety Patient/caregiver will reach out to RN Care Coordinator at [phone] with any resource or care coordination needs         SDOH assessments and interventions completed:  No    Care Coordination Interventions:  Yes, provided  Interventions Today    Flowsheet Row Most Recent Value  Chronic Disease   Chronic disease during today's visit Chronic Obstructive Pulmonary  Disease (COPD)  General Interventions   General Interventions Discussed/Reviewed Communication with, Durable Medical Equipment (DME), General Interventions Reviewed, General Interventions Discussed  [[employer] entry added for Complex Case Management]  Doctor Visits Discussed/Reviewed Doctor Visits Discussed, Doctor Visits Reviewed  Durable Medical Equipment (DME) Oxygen  [4L continuous]  Communication with Social Work  [Complex Care Discussion with Charles Mathews]  Level of Care Personal Care Services  [Patient has an aide that helps him daily with ADLs and transportation. He has lived in in the past for a brief time but he likes to have his own space that is quiet. That living situation made him more anxious. No living family, except for an 76 yo aunt.]  Mental Health Interventions   Mental Health Discussed/Reviewed Refer to Social Work for counseling, Refer to Social Work for resources  Refer to Social Work for counseling regarding Anxiety/Coping  [health related anxiety]  Pharmacy Interventions   Pharmacy Dicussed/Reviewed Pharmacy Topics Discussed, Pharmacy Topics Reviewed  Charles Mathews is taking medications regularly with the help of his in-home-care aide. Sees [facility] for medication management monthly and follows with PCP every 3 months]  Safety Interventions   Safety Discussed/Reviewed Safety Discussed, Safety Reviewed  Home Safety --  [Has an in-home-aide mon-fri 8-5 & they are trying to get additional hours. Has someone appointed to manage his finances.]       Follow up plan: Follow up call scheduled for 02/24/23 with Charles Bad, LCSW [phone]) and 03/01/23 with me, RNCC.   Encounter Outcome:  Pt. Visit Completed   [name], BSN, RN-BC  RN Care Coordinator [facility]  [facility] Direct Dial: [phone] Main #: [phone]
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
## 2023-02-25 DIAGNOSIS — R1084 Generalized abdominal pain: Secondary | ICD-10-CM | POA: Diagnosis not present

## 2023-02-25 DIAGNOSIS — R079 Chest pain, unspecified: Secondary | ICD-10-CM | POA: Diagnosis not present

## 2023-02-25 DIAGNOSIS — I10 Essential (primary) hypertension: Secondary | ICD-10-CM | POA: Diagnosis not present

## 2023-02-25 DIAGNOSIS — R42 Dizziness and giddiness: Secondary | ICD-10-CM | POA: Diagnosis not present

## 2023-02-27 DIAGNOSIS — Z888 Allergy status to other drugs, medicaments and biological substances status: Secondary | ICD-10-CM | POA: Diagnosis not present

## 2023-02-27 DIAGNOSIS — Z87891 Personal history of nicotine dependence: Secondary | ICD-10-CM | POA: Diagnosis not present

## 2023-02-27 DIAGNOSIS — Z743 Need for continuous supervision: Secondary | ICD-10-CM | POA: Diagnosis not present

## 2023-02-27 DIAGNOSIS — R531 Weakness: Secondary | ICD-10-CM | POA: Diagnosis not present

## 2023-02-27 DIAGNOSIS — Z8709 Personal history of other diseases of the respiratory system: Secondary | ICD-10-CM | POA: Diagnosis not present

## 2023-02-27 DIAGNOSIS — R509 Fever, unspecified: Secondary | ICD-10-CM | POA: Diagnosis not present

## 2023-02-27 DIAGNOSIS — R6889 Other general symptoms and signs: Secondary | ICD-10-CM | POA: Diagnosis not present

## 2023-02-27 DIAGNOSIS — J449 Chronic obstructive pulmonary disease, unspecified: Secondary | ICD-10-CM | POA: Diagnosis not present

## 2023-02-27 DIAGNOSIS — Z91040 Latex allergy status: Secondary | ICD-10-CM | POA: Diagnosis not present

## 2023-02-27 DIAGNOSIS — Z20822 Contact with and (suspected) exposure to covid-19: Secondary | ICD-10-CM | POA: Diagnosis not present

## 2023-02-27 DIAGNOSIS — K5730 Diverticulosis of large intestine without perforation or abscess without bleeding: Secondary | ICD-10-CM | POA: Diagnosis not present

## 2023-02-27 DIAGNOSIS — Z880 Allergy status to penicillin: Secondary | ICD-10-CM | POA: Diagnosis not present

## 2023-02-27 DIAGNOSIS — I10 Essential (primary) hypertension: Secondary | ICD-10-CM | POA: Diagnosis not present

## 2023-02-27 DIAGNOSIS — Z885 Allergy status to narcotic agent status: Secondary | ICD-10-CM | POA: Diagnosis not present

## 2023-02-27 DIAGNOSIS — Z91012 Allergy to eggs: Secondary | ICD-10-CM | POA: Diagnosis not present

## 2023-02-27 DIAGNOSIS — F32A Depression, unspecified: Secondary | ICD-10-CM | POA: Diagnosis not present

## 2023-02-27 DIAGNOSIS — N200 Calculus of kidney: Secondary | ICD-10-CM | POA: Diagnosis not present

## 2023-02-27 DIAGNOSIS — Z886 Allergy status to analgesic agent status: Secondary | ICD-10-CM | POA: Diagnosis not present

## 2023-02-27 DIAGNOSIS — R109 Unspecified abdominal pain: Secondary | ICD-10-CM | POA: Diagnosis not present

## 2023-02-27 DIAGNOSIS — R059 Cough, unspecified: Secondary | ICD-10-CM | POA: Diagnosis not present

## 2023-03-01 ENCOUNTER — Encounter: Payer: Self-pay | Admitting: *Deleted

## 2023-03-01 ENCOUNTER — Ambulatory Visit: Payer: Self-pay | Admitting: *Deleted

## 2023-03-01 ENCOUNTER — Ambulatory Visit: Payer: 59 | Admitting: *Deleted

## 2023-03-01 DIAGNOSIS — E7849 Other hyperlipidemia: Secondary | ICD-10-CM | POA: Diagnosis not present

## 2023-03-01 DIAGNOSIS — G4733 Obstructive sleep apnea (adult) (pediatric): Secondary | ICD-10-CM | POA: Diagnosis not present

## 2023-03-01 DIAGNOSIS — Z23 Encounter for immunization: Secondary | ICD-10-CM | POA: Diagnosis not present

## 2023-03-01 DIAGNOSIS — E876 Hypokalemia: Secondary | ICD-10-CM | POA: Diagnosis not present

## 2023-03-01 DIAGNOSIS — Z1389 Encounter for screening for other disorder: Secondary | ICD-10-CM | POA: Diagnosis not present

## 2023-03-01 DIAGNOSIS — J9611 Chronic respiratory failure with hypoxia: Secondary | ICD-10-CM | POA: Diagnosis not present

## 2023-03-01 DIAGNOSIS — K2100 Gastro-esophageal reflux disease with esophagitis, without bleeding: Secondary | ICD-10-CM | POA: Diagnosis not present

## 2023-03-01 DIAGNOSIS — J449 Chronic obstructive pulmonary disease, unspecified: Secondary | ICD-10-CM | POA: Diagnosis not present

## 2023-03-01 DIAGNOSIS — E1165 Type 2 diabetes mellitus with hyperglycemia: Secondary | ICD-10-CM | POA: Diagnosis not present

## 2023-03-01 DIAGNOSIS — I10 Essential (primary) hypertension: Secondary | ICD-10-CM | POA: Diagnosis not present

## 2023-03-01 NOTE — Patient Outreach (Signed)
Care Coordination   Follow Up Visit Note   03/01/2023 Name: Charles Mathews MRN: [id number] DOB: 16-Sep-1960  RONNAL STOOPS is a 62 y.o. year old male who sees [name], MD for primary care. I  spoke with caregiver, [name], by telephone today.  What matters to the patients health and wellness today?  Decreasing health related anxiety    Goals Addressed             This Visit's Progress    Care Coordination Services-Decrease ED Visits       Care Coordination Goals: Patient will take medications as prescribed Patient will follow-up with PCP every 3 months or sooner if needed Patient will follow-up with specialists as recommended Referred to GI in [place] and sleep specialist in [place] to address sleep apnea. Is not wearing CPAP ask because of claustrophobia Patient will continue to follow-up with [facility] counselor weekly Patient will continue to follow-up with [facility] Pyschiat[facility] monthly Patient will utilize Urgent Care or PCP services for acute, non-emergency illnesses or medical concerns and will reserve emergency room visits for medical emergencies Patient will reach out to caregiver, [name], for transportation as needed Patient will reach out to PCP with any new or worsening symptoms  Patient/caregiver will talk with LCSW regarding level of care concerns and health related anxiety Patient/caregiver will reach out to RN Care Coordinator at [phone] with any resource or care coordination needs         SDOH assessments and interventions completed:  Yes SDOH Interventions Today    Flowsheet Row Most Recent Value  SDOH Interventions   Transportation Interventions Patient Resources (Friends/Family), Payor Benefit  Financial Strain Interventions Intervention Not Indicated       Care Coordination Interventions:  Yes, provided  Interventions Today    Flowsheet Row Most Recent Value  Chronic Disease   Chronic disease during today's visit Other, Chronic  Obstructive Pulmonary Disease (COPD)  [developmental disabilities, sleep apnea]  General Interventions   General Interventions Discussed/Reviewed General Interventions Discussed, General Interventions Reviewed, Doctor Visits  [name] has been able to increase her hours per week and can now come on Sundays. She takes Evo to church on Sunday mornings.]  Doctor Visits Discussed/Reviewed Doctor Visits Discussed, Doctor Visits Reviewed, PCP, Specialist  [reviewed recent ED visit on 02/27/23. Discussed PCP visit today with caregiver, [name].]  Health Screening Colonoscopy  [referred by PCP to GI in [place] for nausea and vomiting]  Durable Medical Equipment (DME) Oxygen, Other  [CPAP (does not wear due to claustrophobia). O2 at 4L continuous]  PCP/Specialist Visits Compliance with follow-up visit  [referred to sleep specialist for sleep apnea and GI for nausea and vomiting]  Exercise Interventions   Exercise Discussed/Reviewed Physical Activity, Exercise Discussed, Exercise Reviewed  [ambulates without assistance. Is not interested in exercise.]  Physical Activity Discussed/Reviewed Physical Activity Discussed, Physical Activity Reviewed  Weight Management Weight loss  [Has lost 20 lbs over the past few months. Mostly attributed to cutting out sodas and sweet tea]  Education Interventions   Education Provided Provided Education  Provided Verbal Education On Nutrition, Medication, Exercise, When to see the doctor, Labs  Labs Reviewed Hgb A1c  [Caregiver reports A1C was 6.3 today at PCP visit]  Nutrition Interventions   Nutrition Discussed/Reviewed Nutrition Discussed, Nutrition Reviewed, Decreasing sugar intake  Pharmacy Interventions   Pharmacy Dicussed/Reviewed Pharmacy Topics Discussed, Pharmacy Topics Reviewed, Medications and their functions  [caregiver reports that patient was given something to take PRN for anxiety. She isn't sure of the  name. Not listed on Outside Med list. She will dispense  as directed. Pt has history of taking too many pills if it allowed to take on his own]  Safety Interventions   Safety Discussed/Reviewed Safety Discussed, Safety Reviewed       Follow up plan: Follow up call scheduled for 03/29/23    Encounter Outcome:  Pt. Visit Completed   [name], BSN, RN-BC RN Care Coordinator [facility]  [facility] Direct Dial: [phone] Main #: [phone]
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
## 2023-03-06 ENCOUNTER — Ambulatory Visit: Payer: 59 | Admitting: Cardiology

## 2023-03-06 NOTE — Progress Notes (Deleted)
    Cardiology Office Note  Date: 03/06/2023   ID: Charles Mathews, DOB 17-Oct-1960, MRN [id number]  History of Present Illness: Charles Mathews is a 62 y.o. male last seen in February by Ms. Philis Nettle NP, I reviewed the note (I saw him once in 2022).  Record review finds multiple ER visits at [facility] with various concerns over the last few months.  Recent lab work is noted below.  Lexiscan Myoview in 2022 was low risk, no active ischemia and LVEF 52%.  Echocardiogram in December 2023 revealed LVEF 55 to 60%.  Physical Exam: VS:  There were no vitals taken for this visit., BMI There is no height or weight on file to calculate BMI.  Wt Readings from Last 3 Encounters:  09/28/22 (!) 303 lb (137.4 kg)  09/01/22 298 lb 9.6 oz (135.4 kg)  07/01/22 (!) 309 lb (140.2 kg)    General: Patient appears comfortable at rest. HEENT: Conjunctiva and lids normal, oropharynx clear with moist mucosa. Neck: Supple, no elevated JVP or carotid bruits, no thyromegaly. Lungs: Clear to auscultation, nonlabored breathing at rest. Cardiac: Regular rate and rhythm, no S3 or significant systolic murmur, no pericardial rub. Abdomen: Soft, nontender, no hepatomegaly, bowel sounds present, no guarding or rebound. Extremities: No pitting edema, distal pulses 2+. Skin: Warm and dry. Musculoskeletal: No kyphosis. Neuropsychiatric: Alert and oriented x3, affect grossly appropriate.  ECG:  An ECG dated 07/01/2022 was personally reviewed today and demonstrated:  Sinus rhythm.  Labwork:  June 2023: Cholesterol 131, triglycerides 117, HDL 45, LDL 65 June 2024: Pro-BNP 304 July 2024: Hemoglobin 13.4, platelets 159, potassium 4.1, BUN 15, creatinine 1.24, AST 20, ALT 19  Other Studies Reviewed Today:  Echocardiogram 07/14/2022:  1. Left ventricular ejection fraction, by estimation, is 55 to 60%. The  left ventricle has normal function. Left ventricular endocardial border  not optimally defined to evaluate regional  wall motion. Left ventricular  diastolic parameters were normal.  The average left ventricular global longitudinal strain is -19.4 %. The  global longitudinal strain is normal.   2. Right ventricular systolic function is normal. The right ventricular  size is mildly enlarged.   3. The mitral valve is grossly normal. No evidence of mitral valve  regurgitation. No evidence of mitral stenosis.   4. The aortic valve was not well visualized. There is mild calcification  of the aortic valve. Aortic valve regurgitation is not visualized. No  aortic stenosis is present.   5. The inferior vena cava is normal in size with greater than 50%  respiratory variability, suggesting right atrial pressure of 3 mmHg.   Assessment and Plan:  1.  Essential hypertension.  2.  Mixed hyperlipidemia.  LDL 65 in June 2023.  3.  COPD with chronic hypoxic respiratory failure followed by Pulmonary.  Disposition:  Follow up {follow up:15908}  Signed, Jonelle Sidle, M.D., F.A.C.C. [facility]

## 2023-03-07 DIAGNOSIS — R1084 Generalized abdominal pain: Secondary | ICD-10-CM | POA: Diagnosis not present

## 2023-03-11 DIAGNOSIS — R197 Diarrhea, unspecified: Secondary | ICD-10-CM | POA: Diagnosis not present

## 2023-03-11 DIAGNOSIS — R1084 Generalized abdominal pain: Secondary | ICD-10-CM | POA: Diagnosis not present

## 2023-03-14 ENCOUNTER — Encounter: Payer: Self-pay | Admitting: Cardiology

## 2023-03-14 DIAGNOSIS — R531 Weakness: Secondary | ICD-10-CM | POA: Diagnosis not present

## 2023-03-19 ENCOUNTER — Other Ambulatory Visit: Payer: Self-pay | Admitting: Gastroenterology

## 2023-03-19 DIAGNOSIS — K219 Gastro-esophageal reflux disease without esophagitis: Secondary | ICD-10-CM

## 2023-03-20 ENCOUNTER — Encounter: Payer: Self-pay | Admitting: *Deleted

## 2023-03-20 ENCOUNTER — Ambulatory Visit: Payer: Self-pay | Admitting: *Deleted
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
## 2023-03-21 DIAGNOSIS — R1032 Left lower quadrant pain: Secondary | ICD-10-CM | POA: Diagnosis not present

## 2023-03-23 ENCOUNTER — Encounter: Payer: Self-pay | Admitting: Pulmonary Disease

## 2023-03-23 DIAGNOSIS — J411 Mucopurulent chronic bronchitis: Secondary | ICD-10-CM | POA: Diagnosis not present

## 2023-03-23 DIAGNOSIS — J449 Chronic obstructive pulmonary disease, unspecified: Secondary | ICD-10-CM | POA: Diagnosis not present

## 2023-03-27 ENCOUNTER — Ambulatory Visit: Payer: Self-pay | Admitting: *Deleted

## 2023-03-27 ENCOUNTER — Encounter: Payer: Self-pay | Admitting: *Deleted
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
## 2023-03-29 ENCOUNTER — Ambulatory Visit: Payer: Self-pay | Admitting: *Deleted

## 2023-03-29 ENCOUNTER — Encounter: Payer: Self-pay | Admitting: *Deleted

## 2023-03-29 NOTE — Patient Outreach (Signed)
  Care Coordination   Follow Up Visit Note   03/29/2023 Name: Charles Mathews MRN: [id number] DOB: 1960-11-09  Charles Mathews is a 62 y.o. year old male who sees Charles Chimera, MD for primary care. I  spoke with caregiver, Charles Mathews, by telephone today.   What matters to the patients health and wellness today?  Avoiding emergency room visits due to health related anxiety     Goals Addressed             This Visit's Progress    Care Coordination Services-Decrease ED Visits   On track    Care Coordination Goals: Patient/caregiver will schedule appointment with  Pulmonary in [place] Patient will keep appointment with [facility] on 04/19/23 and cardiologist on 04/04/23 Patient will utilize Urgent Care or PCP services for acute, non-emergency illnesses or medical concerns and will reserve emergency room visits for medical emergencies Patient will reach out to PCP with any new or worsening symptoms  Patient/caregiver will reach out to RN Care Coordinator at [phone] with any resource or care coordination needs         SDOH assessments and interventions completed:  Yes  SDOH Interventions Today    Flowsheet Row Most Recent Value  SDOH Interventions   Transportation Interventions Patient Resources (Friends/Family), Payor Benefit  Stress Interventions Other (Comment)  [Caregiver, Charles Mathews, has seen improvement since his medications have been adjusted]        Care Coordination Interventions:  Yes, provided  Interventions Today    Flowsheet Row Most Recent Value  Chronic Disease   Chronic disease during today's visit Chronic Obstructive Pulmonary Disease (COPD)  General Interventions   General Interventions Discussed/Reviewed General Interventions Discussed, General Interventions Reviewed, Doctor Visits  [facility] ED visit was 02/27/23. He had 6 ED visits in June and 3 in July so there has been an improvement over the last 2 months, especially the  last month.]  Doctor Visits Discussed/Reviewed Doctor Visits Discussed, Doctor Visits Reviewed, Specialist  PCP/Specialist Visits Compliance with follow-up visit  [Cardiologist on 04/04/23 and Gastroenterology on 04/19/23. Needs to schedule pulmonary appointment.]  Education Interventions   Education Provided Provided Education  Provided Verbal Education On When to see the doctor, Medication, Mental Health/Coping with Illness  Mental Health Interventions   Mental Health Discussed/Reviewed Mental Health Reviewed, Mental Health Discussed  [Established with Charles Mathews for Counseling and Medication Management]  Pharmacy Interventions   Pharmacy Dicussed/Reviewed Pharmacy Topics Reviewed, Pharmacy Topics Discussed  [Psychiatrist increased prozac to 20mg  daily and prescribed hydroxyzine 25 mg to take at night to help with anxiety that he has when alone at night.]  Safety Interventions   Safety Discussed/Reviewed Safety Discussed, Safety Reviewed, Home Safety  [Aid is with him 6 days a week 8:00 am to 4:00 pm and he has her contact information to reach her in the evening if needed.]       Follow up plan: Follow up call scheduled for 04/18/23    Encounter Outcome:  Pt. Visit Completed   Charles Loll, RN, BSN RN Care Coordinator [facility]  [facility] Direct Dial: [phone] Main #: [phone]

## 2023-04-03 NOTE — Progress Notes (Deleted)
Cardiology Office Note  Date: 04/03/2023   ID: Charles Mathews, DOB 1961/03/17, MRN [id number]  History of Present Illness: Charles Mathews is a 62 y.o. male last seen in February by Ms. Philis Nettle NP, I reviewed the note (I saw him once in 2022).  Record review finds multiple ER visits at [facility] with various concerns over the last few months.  Recent lab work is noted below.  Lexiscan Myoview in 2022 was low risk, no active ischemia and LVEF 52%.  Echocardiogram in December 2023 revealed LVEF 55 to 60%.  Physical Exam: VS:  There were no vitals taken for this visit., BMI There is no height or weight on file to calculate BMI.  Wt Readings from Last 3 Encounters:  09/28/22 (!) 303 lb (137.4 kg)  09/01/22 298 lb 9.6 oz (135.4 kg)  07/01/22 (!) 309 lb (140.2 kg)    General: Patient appears comfortable at rest. HEENT: Conjunctiva and lids normal, oropharynx clear with moist mucosa. Neck: Supple, no elevated JVP or carotid bruits, no thyromegaly. Lungs: Clear to auscultation, nonlabored breathing at rest. Cardiac: Regular rate and rhythm, no S3 or significant systolic murmur, no pericardial rub. Abdomen: Soft, nontender, no hepatomegaly, bowel sounds present, no guarding or rebound. Extremities: No pitting edema, distal pulses 2+. Skin: Warm and dry. Musculoskeletal: No kyphosis. Neuropsychiatric: Alert and oriented x3, affect grossly appropriate.  ECG:  An ECG dated 07/01/2022 was personally reviewed today and demonstrated:  Sinus rhythm.  Labwork:  June 2023: Cholesterol 131, triglycerides 117, HDL 45, LDL 65 June 2024: Pro-BNP 304 July 2024: Hemoglobin 13.4, platelets 159, potassium 4.1, BUN 15, creatinine 1.24, AST 20, ALT 19  Other Studies Reviewed Today:  Echocardiogram 07/14/2022:  1. Left ventricular ejection fraction, by estimation, is 55 to 60%. The  left ventricle has normal function. Left ventricular endocardial border  not optimally defined to evaluate regional  wall motion. Left ventricular  diastolic parameters were normal.  The average left ventricular global longitudinal strain is -19.4 %. The  global longitudinal strain is normal.   2. Right ventricular systolic function is normal. The right ventricular  size is mildly enlarged.   3. The mitral valve is grossly normal. No evidence of mitral valve  regurgitation. No evidence of mitral stenosis.   4. The aortic valve was not well visualized. There is mild calcification  of the aortic valve. Aortic valve regurgitation is not visualized. No  aortic stenosis is present.   5. The inferior vena cava is normal in size with greater than 50%  respiratory variability, suggesting right atrial pressure of 3 mmHg.   Assessment and Plan:  1.  Essential hypertension.  2.  Mixed hyperlipidemia.  LDL 65 in June 2023.  3.  COPD with chronic hypoxic respiratory failure followed by Pulmonary.  Disposition:  Follow up {follow up:15908}  Signed, Jonelle Sidle, M.D., F.A.C.C. [facility] at [facility]

## 2023-04-04 ENCOUNTER — Ambulatory Visit: Payer: 59 | Admitting: Cardiology

## 2023-04-04 DIAGNOSIS — I10 Essential (primary) hypertension: Secondary | ICD-10-CM

## 2023-04-05 DIAGNOSIS — J449 Chronic obstructive pulmonary disease, unspecified: Secondary | ICD-10-CM | POA: Diagnosis not present

## 2023-04-05 DIAGNOSIS — Z743 Need for continuous supervision: Secondary | ICD-10-CM | POA: Diagnosis not present

## 2023-04-05 DIAGNOSIS — S42002A Fracture of unspecified part of left clavicle, initial encounter for closed fracture: Secondary | ICD-10-CM | POA: Diagnosis not present

## 2023-04-05 DIAGNOSIS — J9611 Chronic respiratory failure with hypoxia: Secondary | ICD-10-CM | POA: Diagnosis not present

## 2023-04-05 DIAGNOSIS — I10 Essential (primary) hypertension: Secondary | ICD-10-CM | POA: Diagnosis not present

## 2023-04-05 DIAGNOSIS — Z87891 Personal history of nicotine dependence: Secondary | ICD-10-CM | POA: Diagnosis not present

## 2023-04-05 DIAGNOSIS — R918 Other nonspecific abnormal finding of lung field: Secondary | ICD-10-CM | POA: Diagnosis not present

## 2023-04-05 DIAGNOSIS — R6889 Other general symptoms and signs: Secondary | ICD-10-CM | POA: Diagnosis not present

## 2023-04-05 DIAGNOSIS — I499 Cardiac arrhythmia, unspecified: Secondary | ICD-10-CM | POA: Diagnosis not present

## 2023-04-05 DIAGNOSIS — R059 Cough, unspecified: Secondary | ICD-10-CM | POA: Diagnosis not present

## 2023-04-05 DIAGNOSIS — R109 Unspecified abdominal pain: Secondary | ICD-10-CM | POA: Diagnosis not present

## 2023-04-05 DIAGNOSIS — R030 Elevated blood-pressure reading, without diagnosis of hypertension: Secondary | ICD-10-CM | POA: Diagnosis not present

## 2023-04-05 DIAGNOSIS — J441 Chronic obstructive pulmonary disease with (acute) exacerbation: Secondary | ICD-10-CM | POA: Diagnosis not present

## 2023-04-05 DIAGNOSIS — Z1159 Encounter for screening for other viral diseases: Secondary | ICD-10-CM | POA: Diagnosis not present

## 2023-04-11 ENCOUNTER — Encounter: Payer: Self-pay | Admitting: *Deleted

## 2023-04-11 ENCOUNTER — Ambulatory Visit: Payer: Self-pay | Admitting: *Deleted
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
## 2023-04-18 ENCOUNTER — Encounter: Payer: Self-pay | Admitting: *Deleted

## 2023-04-18 ENCOUNTER — Ambulatory Visit: Payer: Self-pay | Admitting: *Deleted

## 2023-04-18 NOTE — Patient Outreach (Signed)
Care Coordination   Follow Up Visit Note   04/18/2023 Name: Charles Mathews MRN: [id number] DOB: [birth year]  Charles Mathews is a 62 y.o. year old male who sees [name], MD for primary care. I  spoke with caregiver, [name], by telephone today.   What matters to the patients health and wellness today?  Managing sleep     Goals Addressed             This Visit's Progress    Care Coordination Services-Decrease ED Visits   On track    Care Coordination Goals: Patient will keep appointment with [facility] on 04/19/23 and [facility] on 05/08/23 Patient will discuss sleep apnea management with pulmonologist  Patient will take medication as prescribed and follow-up with counseling and psychiatry as recommended Patient will utilize Urgent Care or PCP services for acute, non-emergency illnesses or medical concerns and will reserve emergency room visits for medical emergencies Patient will reach out to PCP with any new or worsening symptoms  Patient/caregiver will reach out to RN Care Coordinator at [phone] with any resource or care coordination needs         SDOH assessments and interventions completed:  Yes  SDOH Interventions Today    Flowsheet Row Most Recent Value  SDOH Interventions   Transportation Interventions Patient Resources (Friends/Family), Payor Benefit  Financial Strain Interventions Intervention Not Indicated        Care Coordination Interventions:  Yes, provided  Interventions Today    Flowsheet Row Most Recent Value  Chronic Disease   Chronic disease during today's visit Chronic Obstructive Pulmonary Disease (COPD), Other  [sleep apnea, anxiety]  General Interventions   General Interventions Discussed/Reviewed General Interventions Discussed, General Interventions Reviewed, Durable Medical Equipment (DME), Doctor Visits, Vaccines  Vaccines COVID-19, Flu, Pneumonia, RSV  Doctor Visits Discussed/Reviewed Doctor  Visits Discussed, Doctor Visits Reviewed, PCP, Specialist, Annual Wellness Visits  Durable Medical Equipment (DME) Other, Oxygen  [doesn't use CPAP due to claustrophobia but does use oxygen at night]  PCP/Specialist Visits Compliance with follow-up visit  [Gastroenterology 04/19/23 and pulmonary 05/08/23]  Exercise Interventions   Exercise Discussed/Reviewed Physical Activity  [ambulates without assistance. Encouraged to increase activity level as tolerated 3 to 5 times a week]  Physical Activity Discussed/Reviewed Physical Activity Discussed, Physical Activity Reviewed  Education Interventions   Education Provided Provided Education  Provided Verbal Education On When to see the doctor, Medication, Mental Health/Coping with Illness, Nutrition  Mental Health Interventions   Mental Health Discussed/Reviewed Mental Health Discussed, Mental Health Reviewed  [Established with [facility] for Counseling and Medication Management. had visit with psychiatrist yesterday and sleeping medication was increased.]  Nutrition Interventions   Nutrition Discussed/Reviewed Nutrition Discussed, Nutrition Reviewed, Carbohydrate meal planning, Increasing proteins, Adding fruits and vegetables, Decreasing sugar intake, Portion sizes, Fluid intake  [Discussed ways to increase water and fiber intake. Caregiver prepares meals and limits sugar and simple carbs.]  Pharmacy Interventions   Pharmacy Dicussed/Reviewed Pharmacy Topics Discussed, Pharmacy Topics Reviewed, Medications and their functions  [[name], caregiver, manages his medications and he takes them regularly.]  Safety Interventions   Safety Discussed/Reviewed Safety Discussed, Safety Reviewed, Fall Risk, Home Safety  Home Safety --  [name] is with him 6 days a week 8:00 am to 4:00 pm and he has her contact information to reach her in the evening if needed. medications are kept out of reach so that he doesn't take too many.]       Follow up plan: Follow up call  scheduled for 05/19/23    Encounter Outcome:  Patient Visit Completed   [name], RN, BSN Care Management Coordinator [facility]  [facility] Direct Dial: [phone] Main #: [phone]

## 2023-04-19 ENCOUNTER — Ambulatory Visit: Payer: 59 | Admitting: Gastroenterology

## 2023-04-19 ENCOUNTER — Ambulatory Visit (INDEPENDENT_AMBULATORY_CARE_PROVIDER_SITE_OTHER): Payer: 59 | Admitting: Gastroenterology

## 2023-04-19 ENCOUNTER — Encounter: Payer: Self-pay | Admitting: Gastroenterology

## 2023-04-19 VITALS — BP 134/70 | HR 56 | Temp 98.6°F | Ht 69.0 in | Wt 285.0 lb

## 2023-04-19 DIAGNOSIS — K5900 Constipation, unspecified: Secondary | ICD-10-CM

## 2023-04-19 DIAGNOSIS — K2270 Barrett's esophagus without dysplasia: Secondary | ICD-10-CM

## 2023-04-19 DIAGNOSIS — R1030 Lower abdominal pain, unspecified: Secondary | ICD-10-CM | POA: Diagnosis not present

## 2023-04-19 DIAGNOSIS — R1310 Dysphagia, unspecified: Secondary | ICD-10-CM | POA: Diagnosis not present

## 2023-04-19 DIAGNOSIS — K219 Gastro-esophageal reflux disease without esophagitis: Secondary | ICD-10-CM | POA: Diagnosis not present

## 2023-04-19 MED ORDER — SUCRALFATE 1 GM/10ML PO SUSP: 1.0000 g | Freq: Three times a day (TID) | ORAL | 0 refills | Status: DC

## 2023-04-19 MED ORDER — ESOMEPRAZOLE MAGNESIUM 40 MG PO CPDR: 40.0000 mg | DELAYED_RELEASE_CAPSULE | Freq: Two times a day (BID) | ORAL | 3 refills | Status: DC

## 2023-04-19 NOTE — Patient Instructions (Addendum)
I will provide you some samples of Linzess 72 mcg today.  Please take 1 tablet on empty stomach, at least 30 minutes prior to breakfast.  This is to help with constipation.  Usually you can have up to 2 weeks of diarrhea before things start to improve.  This is normal and you should still continue to take the medication.  Please try to increase your water intake.  Please try to drink at least 6-8 glasses of water daily.  You may continue to drink some tea and diet soda as well however mobility and increasing water intake is important for your constipation.  Decreasing her diet soda use is good to prevent worsening acid reflux.  I have sent in Nexium for you.  You will take 1 tablet twice daily, 30 minutes prior to breakfast and dinner.  When you start taking Nexium you should stop taking your omeprazole.  You can continue to take famotidine twice daily.  I have sent in Carafate for you.  I will try to get the suspension covered and you will take a dose 3 times a day before meals and at bedtime.  If we are unable to get the suspension covered we will do the tablets and then you should dissolve it in 1-2 ounces of water and drink before meals.  Avoid gas-producing foods (eg, cabbage, legumes, onions, broccoli, brussel sprouts, wheat, and potatoes).   We will follow-up in 4 months, sooner if needed.  Please call with a progress report in 7-10 days to let me know how the Linzess is doing.  It was a pleasure to see you today. I want to create trusting relationships with patients. If you receive a survey regarding your visit,  I greatly appreciate you taking time to fill this out on paper or through your MyChart. I value your feedback.  Brooke Bonito, MSN, FNP-BC, AGACNP-BC [facility]

## 2023-04-19 NOTE — Progress Notes (Addendum)
GI Office Note    Referring Provider: [name], MD Primary Care Physician:  [name], MD Primary Gastroenterologist: Dolores Frame, MD  Date:  04/19/2023  ID:  Charles Mathews, DOB 1961/04/08, MRN [id number]  Chief Complaint   Chief Complaint  Patient presents with   Follow-up    Follow up from ED visit. Pt states he is a little better   History of Present Illness  Charles Mathews is a 62 y.o. male with a history of asthma, anxiety, PUD, HTN, GERD, chronic back pain, obesity, HLD, seizures, and COPD presenting today for follow-up.  EGD 07/06/21: -normal esophagus s/p dilation -mucosal changes suspicious for barrett's  -gastritis s/p biopsy (reactive foveolar hyperplasia, change compatible with PPI) -normal duodenum s/p biopsy -EGD in 3 years   Colonoscopy 07/06/21: -6 polyps in descending colon, splenic flexure, ascending colon -single polyp in the transverse colon -non bleeding internal hemorrhoids.  -all polyps with fragments of tubular adenomas.  -Repeat in 3 years   Per review of chart patient has had multiple ED visits in the last 5 months for abdominal pain. 1 in October, 2 in November, 3 in December, 1 in January.    CT A/P 06/25/22: -no acute abnormality -left nephrolithiasis -colonic diverticulosis without diverticulitis   Labs 07/04/22: normal LFTs, A1c 6.2, TSH 2.840, Triglycerides 184, HDL 34,    Abdominal US 07/06/23: -CBD 4mm -hepatic steatosis -no liver lesions -patent portal vein   07/16/22: KUB with negative film.    HIDA 07/27/22: -patent cystic and CBD -normal gallbladder EF   CT A/P 08/07/22 at [facility]: Impression: 1. No acute findings within the abdomen or pelvis.  2. Previously noted inferior pole left kidney stone is no longer  visualized and has presumably passed since the exam from 06/25/2022.  3. Sigmoid diverticulosis without acute diverticulitis.  Last office visit 09/28/2022.  Patient reports a right lower  quadrant gassy type pain that comes and goes.  Throbs at times.  Tries to have a bowel movement daily but has looser stools at times and some days it is different and occasionally will skip a day with bowel movements.  Does admit to drinking carbonated diet sodas, tea, and cranberry juice.  Rarely drinks water.  Reports looser stools when he eats out along with some urgency.  Reports a good appetite.  Stools have been dark but not black.  Denied any BRBPR.  Stated he felt bloated and heavy in the mornings after he gets up and has frequent gas.  Occasionally having burning with urination.  Reports a history of kidney stones.  Denied any frequent NSAID use but does report he eats off of Tums and drink Pepto.  Advised to increase omeprazole to 40 mg twice daily and continue famotidine 20 mg twice daily.  Advise increase fiber in his diet and follow low FODMAP.  Given simethicone to use up to 4 times a day as needed.  Advised he need to repeat EGD and colonoscopy in December 2025.  Per review of his chart he has had multiple ED visits for various complaints since his last visit including vertigo, COPD exacerbation, right flank pain, back pain, bronchitis, gastroenteritis, and weakness.  CT A/P 10/01/2022: 1. No acute noncontrast CT findings of the abdomen or pelvis to  explain right flank pain. No urinary tract calculi or  hydronephrosis.  2. Descending and sigmoid diverticulosis without evidence of acute diverticulitis.   Right upper quadrant ultrasound 12/08/2022: Negative for gallstones or biliary dilation Hepatic steatosis  14 mm cyst adjacent to the gallbladder Patent portal vein  CT A/P 01/04/2023 Impression Normal-appearing appendix. Diverticulosis without diverticulitis. Right solid pulmonary nodule measuring 4 mm. Per Fleischner Society Guidelines, no routine follow-up imaging is recommended.  CT A/P 02/27/2023 Impression 1. Punctate less than 2 mm nonobstructing right renal calculus. 2.  Diverticulosis without diverticulitis. 3. Otherwise unremarkable unenhanced CT of the abdomen and pelvis.  CT A/P 04/05/2023 Impression 1. No renal or ureteral stone or obstruction. 2. No evidence of bowel obstruction or inflammation.  Today: Constipation - Having worsening constipation. Dr. Reuel Boom recommended he take miralax  He has fiber gummies at home and has not been taking them as he doesn't like them. Same with the miralax. Dr. Reuel Boom had given him a miralax cleanse and he did go more and CNA said he lost abotu 7 lbs. No melena or brbpr.   GERD - Still having a lot of heartburn so he is taking tums frequently. Unsure what is causing it. He states he is eating lots of red meat. Lately has not been eating a lot of red sauces. Primarily eats [place] burger. Has never tried anything else other than omeprazole. No N/V. Does report some difficulty    Current Outpatient Medications  Medication Sig Dispense Refill   ACCU-CHEK GUIDE test strip USE ONCE DAILY TO TEST SUGAR     Accu-Chek Softclix Lancets lancets USE TO TEST ONCE DAILY     acetaminophen (TYLENOL) 650 MG CR tablet Take 1,300 mg by mouth every 8 (eight) hours as needed for pain.     albuterol (VENTOLIN HFA) 108 (90 Base) MCG/ACT inhaler Inhale 1 puff into the lungs every 6 (six) hours as needed for wheezing or shortness of breath.     aspirin EC 81 MG tablet Take 81 mg by mouth daily.     Bioflavonoid Products (VITA C/BIOFLAVONOIDS/ROSE HIPS) 1000-30-18 MG TABS Take 1 tablet by mouth daily.     Blood Glucose Monitoring Suppl (ACCU-CHEK GUIDE) w/Device KIT USE ONCE DAILY TO TEST SUAGR     Budeson-Glycopyrrol-Formoterol (BREZTRI AEROSPHERE) 160-9-4.8 MCG/ACT AERO Inhale 2 puffs into the lungs in the morning and at bedtime.     Carboxymethylcellul-Glycerin (CLEAR EYES FOR DRY EYES) 1-0.25 % SOLN Place 1 drop into both eyes daily as needed (dry eyes).     cetirizine (ZYRTEC) 10 MG tablet Take 10 mg by mouth daily.     citalopram (CELEXA)  20 MG tablet Take 20 mg by mouth daily.     dicyclomine (BENTYL) 10 MG capsule Take 1 capsule (10 mg total) by mouth 3 (three) times daily as needed for spasms. 90 capsule 3   doxazosin (CARDURA) 2 MG tablet Take 1 tablet (2 mg total) by mouth daily. 90 tablet 3   famotidine (PEPCID) 20 MG tablet Take 20 mg by mouth 2 (two) times daily.     fluticasone (FLONASE) 50 MCG/ACT nasal spray INSTILL ONE SPRAY INTO BOTH NOSTRILS DAILY 16 g 3   furosemide (LASIX) 40 MG tablet TAKE 1 TABLET BY MOUTH DAILY 90 tablet 3   gabapentin (NEURONTIN) 100 MG capsule Take 100 mg by mouth at bedtime.     LATUDA 40 MG TABS tablet Take by mouth.     meclizine (ANTIVERT) 25 MG tablet Take 12.5 mg by mouth daily.     metFORMIN (GLUCOPHAGE) 500 MG tablet Take 500 mg by mouth daily.     metoprolol tartrate (LOPRESSOR) 50 MG tablet TAKE 1 TABLET BY MOUTH TWICE DAILY 180 tablet 3  Multiple Vitamin (MULTIVITAMIN) tablet Take 2 tablets by mouth daily. Gummies     omeprazole (PRILOSEC) 40 MG capsule TAKE 1 CAPSULE BY MOUTH TWICE DAILY BEFORE MEALS 90 capsule 3   ondansetron (ZOFRAN ODT) 4 MG disintegrating tablet Take 1 tablet (4 mg total) by mouth every 8 (eight) hours as needed for nausea or vomiting. 20 tablet 5   potassium chloride SA (KLOR-CON M) 20 MEQ tablet TAKE 1 TABLET BY MOUTH DAILY 90 tablet 3   rosuvastatin (CRESTOR) 40 MG tablet Take 40 mg by mouth daily.     Semaglutide,0.25 or 0.5MG /DOS, (OZEMPIC, 0.25 OR 0.5 MG/DOSE,) 2 MG/1.5ML SOPN Inject 0.25 mg into the skin every Friday.     sertraline (ZOLOFT) 100 MG tablet Take 100 mg by mouth at bedtime.     Simethicone 125 MG TABS Take 1 tablet (125 mg total) by mouth 4 (four) times daily as needed. 120 tablet 1   topiramate (TOPAMAX) 50 MG tablet Take 50 mg by mouth daily.     traMADol (ULTRAM) 50 MG tablet Take 50 mg by mouth every 6 (six) hours as needed for severe pain.     traZODone (DESYREL) 100 MG tablet Take 100 mg by mouth at bedtime.     benzonatate  (TESSALON) 100 MG capsule Take 100 mg by mouth 3 (three) times daily as needed. (Patient not taking: Reported on 04/19/2023)     Melatonin 10 MG TABS Take 10 mg by mouth at bedtime. (Patient not taking: Reported on 04/19/2023)     No current facility-administered medications for this visit.    Past Medical History:  Diagnosis Date   Anxiety    Arthritis    Asthma    Bipolar disorder (HCC)    Chronic back pain    COPD (chronic obstructive pulmonary disease) (HCC)    Essential hypertension    Gastric ulcer    GERD (gastroesophageal reflux disease)    Head injury    History of kidney stones    Insomnia    Major depression    Mentally challenged    Migraine    Mixed hyperlipidemia    Morbid obesity (HCC)    Neuropathy    Pre-diabetes    Pulmonary hamartoma (HCC)    Seizures (HCC)     10/27/20- last one age 12-14   Sleep apnea    Urinary urgency     Past Surgical History:  Procedure Laterality Date   ARTERY BIOPSY N/A 10/28/2020   Procedure: BIOPSY TEMPORAL ARTERY;  Surgeon: Larina Earthly, MD;  Location: [facility] OR;  Service: Vascular;  Laterality: N/A;   BIOPSY  07/06/2021   Procedure: BIOPSY;  Surgeon: Dolores Frame, MD;  Location: [facility];  Service: Gastroenterology;;   COLONOSCOPY WITH PROPOFOL N/A 07/06/2021   Procedure: COLONOSCOPY WITH PROPOFOL;  Surgeon: Dolores Frame, MD;  Location: [facility];  Service: Gastroenterology;  Laterality: N/A;  10:15   CYSTOSCOPY WITH RETROGRADE PYELOGRAM, URETEROSCOPY AND STENT PLACEMENT Left 11/30/2020   Procedure: CYSTOSCOPY WITH LEFT RETROGRADE PYELOGRAM, LEFT URETEROSCOPY AND LEFT STENT PLACEMENT;  Surgeon: Malen Gauze, MD;  Location: [facility];  Service: Urology;  Laterality: Left;   ESOPHAGOGASTRODUODENOSCOPY (EGD) WITH PROPOFOL N/A 07/06/2021   Procedure: ESOPHAGOGASTRODUODENOSCOPY (EGD) WITH PROPOFOL;  Surgeon: Dolores Frame, MD;  Location: [facility];  Service: Gastroenterology;   Laterality: N/A;   LITHOTRIPSY  2009   MULTIPLE EXTRACTIONS WITH ALVEOLOPLASTY N/A 12/17/2012   Procedure: MULTIPLE EXTRACTION WITH ALVEOLOPLASTY;  Surgeon: Georgia Lopes, DDS;  Location: [facility] OR;  Service: Oral Surgery;  Laterality: N/A;   POLYPECTOMY  07/06/2021   Procedure: POLYPECTOMY;  Surgeon: Dolores Frame, MD;  Location: [facility];  Service: Gastroenterology;;   Gaspar Bidding DILATION  07/06/2021   Procedure: Gaspar Bidding DILATION;  Surgeon: Marguerita Merles, Reuel Boom, MD;  Location: [facility];  Service: Gastroenterology;;    Family History  Adopted: Yes    Allergies as of 04/19/2023 - Review Complete 04/19/2023  Allergen Reaction Noted   Bee venom Anaphylaxis 03/31/2011   Benadryl [diphenhydramine]  01/15/2018   Lorazepam Other (See Comments) 04/19/2011   Penicillins Other (See Comments) 03/31/2011   Latex Rash 10/19/2020    Social History   Socioeconomic History   Marital status: Single    Spouse name: Not on file   Number of children: 0   Years of education: 12   Highest education level: 12th grade  Occupational History   Not on file  Tobacco Use   Smoking status: Former    Current packs/day: 0.00    Average packs/day: 2.0 packs/day for 27.0 years (54.0 ttl pk-yrs)    Types: Cigarettes    Start date: 08/02/1975    Quit date: 08/01/2002    Years since quitting: 20.7    Passive exposure: Past   Smokeless tobacco: Never   Tobacco comments:    Patient states that he never was an everyday smoker only tried it when he was 74    Verified by Power of Attorney - Jeanelle Malling  Vaping Use   Vaping status: Never Used  Substance and Sexual Activity   Alcohol use: No   Drug use: No   Sexual activity: Never  Other Topics Concern   Not on file  Social History Narrative   Left handed   Social Determinants of Health   Financial Resource Strain: Low Risk  (04/18/2023)   Overall Financial Resource Strain (CARDIA)    Difficulty of Paying Living Expenses: Not hard at  all  Food Insecurity: No Food Insecurity (02/24/2023)   Hunger Vital Sign    Worried About Running Out of Food in the Last Year: Never true    Ran Out of Food in the Last Year: Never true  Transportation Needs: No Transportation Needs (04/18/2023)   PRAPARE - Administrator, Civil Service (Medical): No    Lack of Transportation (Non-Medical): No  Physical Activity: Inactive (02/24/2023)   Exercise Vital Sign    Days of Exercise per Week: 0 days    Minutes of Exercise per Session: 0 min  Stress: Stress Concern Present (03/29/2023)   [employer] of Occupational Health - Occupational Stress Questionnaire    Feeling of Stress : To some extent  Social Connections: Unknown (02/24/2023)   Social Connection and Isolation Panel [NHANES]    Frequency of Communication with Friends and Family: More than three times a week    Frequency of Social Gatherings with Friends and Family: More than three times a week    Attends Religious Services: Never    Database administrator or Organizations: No    Attends Banker Meetings: Never    Marital Status: Not on file   Review of Systems   Gen: Denies fever, chills, anorexia. Denies fatigue, weakness, weight loss.  CV: Denies chest pain, palpitations, syncope, peripheral edema, and claudication. Resp: Denies dyspnea at rest, cough, wheezing, coughing up blood, and pleurisy. GI: See HPI Derm: Denies rash, itching, dry skin Psych: Denies depression, anxiety, memory loss, confusion. No  homicidal or suicidal ideation.  Heme: Denies bruising, bleeding, and enlarged lymph nodes.  Physical Exam   BP 134/70   Pulse (!) 56   Temp 98.6 F (37 C)   Ht 5\' 9"  (1.753 m)   Wt 285 lb (129.3 kg)   BMI 42.09 kg/m   General:   Alert and oriented. No distress noted. Pleasant and cooperative.  Head:  Normocephalic and atraumatic. Eyes:  Conjuctiva clear without scleral icterus. Mouth:  Oral mucosa pink and moist. Good dentition. No  lesions. Lungs:  Clear to auscultation bilaterally. No wheezes, rales, or rhonchi. No distress.  Heart:  S1, S2 present without murmurs appreciated.  Abdomen:  +BS, soft, non-distended, rounded.  TTP to LLQ, RLQ, and epigastrium. No rebound or guarding. No HSM or masses noted. Rectal: deferred Msk:  Symmetrical without gross deformities. Normal posture. Extremities:  Without edema. Neurologic:  Alert and  oriented x4 Psych:  Alert and cooperative. Normal mood and affect.  Assessment  Charles Mathews is a 62 y.o. male with a history of asthma, anxiety, PUD, HTN, GERD, chronic back pain, obesity, HLD, seizures, and COPD presenting today for follow-up.  Abdominal pain: Continues to experience some lower abdominal pain R >L and some right flank pain.  He has had multiple ED visits over the last 6-8 months with multiple negative imaging studies including abdominal x-ray, ultrasound, HIDA scan, and multiple CTs scans with contrast.  Recent EGD and colonoscopy is outlined above.  Does have some tenderness to his lower abdomen R>L today as well as some epigastric discomfort.  This is likely multifactorial in the setting of uncontrolled acid reflux as well as constipation.  We discussed recommended dietary changes today, increasing mobility, and hydration.  Also advised on weight loss.  Suspect that if his GERD and constipation are well-controlled that his abdominal pain will also improve.  Constipation, bloating: Continues to struggle with constipation.  Continues to have some intermittent diarrhea.  He has CNA that accompanies him states that his PCP told him to drink large doses of MiraLAX until he began having bowel movements given his multiple prior ED visits and states that the patient had significant amounts of stool thereafter, possibly losing about 5 to 7 pounds.  Physically recommended daily MiraLAX however patient does not like drinking this and also does not like taking his fiber Gummies.  His CNA  reports that he does much better with oral medications given she places it in his pillbox and he takes them every day.  Has been taking simethicone as needed as well to help with gassiness/bloating.  Continue to suspect that his bloating is related to his constipation as well as dietary habits.  Given his inconsistency with MiraLAX and fiber supplementation we will trial Linzess 72 mcg daily to help with bloating and his need for straining.  Instructions given to his aide that helps him with his medications and advised to call if this medication seems to help and if so we will send in prescription.  Potentially may need larger dose of 145 mcg daily.  GERD, epigastric pain, Barrett's esophagus: Symptoms somewhat improved with twice daily dosing of omeprazole however continues to use Tums although not using Pepto-Bismol as frequently.  He is unclear of any specific dietary factors that worsen his symptoms but he does admit to frequent consumption of diet sodas and likes to eat red meat.  Does not eat as much fried foods, spicy foods, or red sauces/acidic fruit juices.  Reinforced dietary factors today along  with weight loss.  Prior EGD with Barrett's esophagus therefore he should remain on PPI.  Given he has been on omeprazole long-term we will trial switch of PPI from omeprazole to Nexium and continue twice daily dosing.  For now he can continue famotidine twice daily and we will give him a short course of Carafate in an effort to have him decrease his Tums use.  PLAN   Stop omeprazole and start Nexium 40 mg BID Chewing precautions discussed Continue famotidine 20 mg twice daily Carafate for 2 weeks.  GERD diet Continue simethicone 125 mcg QID as needed Linzess 72 mcg samples daily  Increase water intake Avoid gas producing foods Follow up in 4 months  Brooke Bonito, MSN, FNP-BC, AGACNP-BC [facility]  I have reviewed the note and agree with the APP's assessment as  described in this progress note  Katrinka Blazing, MD Gastroenterology and Hepatology [facility]

## 2023-04-23 DIAGNOSIS — J411 Mucopurulent chronic bronchitis: Secondary | ICD-10-CM | POA: Diagnosis not present

## 2023-04-23 DIAGNOSIS — J449 Chronic obstructive pulmonary disease, unspecified: Secondary | ICD-10-CM | POA: Diagnosis not present

## 2023-04-25 ENCOUNTER — Telehealth: Payer: Self-pay | Admitting: *Deleted

## 2023-04-25 NOTE — Progress Notes (Signed)
Care Coordination Note  04/25/2023 Name: CYNTHIA CHALLENDER MRN: [id number] DOB: 05-08-1961  QUAN FORNEY is a 62 y.o. year old male who is a primary care patient of [name], MD and is actively engaged with the care management team. I reached out to Natividad Brood by phone today to assist with re-scheduling a follow up visit with the RN Case Manager  Follow up plan: Telephone appointment with care management team member scheduled for:05/26/23  [facility] Coordination Care Guide  Direct Dial: [phone]

## 2023-05-01 DIAGNOSIS — E1122 Type 2 diabetes mellitus with diabetic chronic kidney disease: Secondary | ICD-10-CM | POA: Diagnosis not present

## 2023-05-01 DIAGNOSIS — J449 Chronic obstructive pulmonary disease, unspecified: Secondary | ICD-10-CM | POA: Diagnosis not present

## 2023-05-01 DIAGNOSIS — E782 Mixed hyperlipidemia: Secondary | ICD-10-CM | POA: Diagnosis not present

## 2023-05-01 DIAGNOSIS — I10 Essential (primary) hypertension: Secondary | ICD-10-CM | POA: Diagnosis not present

## 2023-05-08 ENCOUNTER — Ambulatory Visit: Payer: 59

## 2023-05-08 ENCOUNTER — Encounter: Payer: Self-pay | Admitting: Adult Health

## 2023-05-08 ENCOUNTER — Ambulatory Visit: Payer: 59 | Admitting: Adult Health

## 2023-05-08 VITALS — BP 110/70 | HR 61 | Ht 69.0 in | Wt 281.4 lb

## 2023-05-08 DIAGNOSIS — J9611 Chronic respiratory failure with hypoxia: Secondary | ICD-10-CM

## 2023-05-08 DIAGNOSIS — Z23 Encounter for immunization: Secondary | ICD-10-CM | POA: Diagnosis not present

## 2023-05-08 DIAGNOSIS — R918 Other nonspecific abnormal finding of lung field: Secondary | ICD-10-CM | POA: Diagnosis not present

## 2023-05-08 DIAGNOSIS — J449 Chronic obstructive pulmonary disease, unspecified: Secondary | ICD-10-CM | POA: Diagnosis not present

## 2023-05-08 DIAGNOSIS — G4733 Obstructive sleep apnea (adult) (pediatric): Secondary | ICD-10-CM | POA: Diagnosis not present

## 2023-05-08 NOTE — Assessment & Plan Note (Addendum)
Nocturnal hypoxemia in the setting of severe obstructive apnea/OHS.  May also have a component of COPD as he has a history of smoking.  Will check PFTs on return.  Continue on oxygen 4 L at bedtime.  Unable to tolerate CPAP therapy. Check CXR today   Plan  . Patient Instructions  Continue on Oxygen 4l/m At bedtime   Albuterol inhaler As needed   Chest xray today  Flu shot today  Follow up with Dr. Sherene Sires in 6 months with PFT (30 min slot)

## 2023-05-08 NOTE — Assessment & Plan Note (Signed)
Severe obstructive sleep apnea/obesity hypoventilation syndrome-unfortunately patient's been unable to tolerate CPAP therapy.  He does not wish to restart that.  Have asked him to continue on oxygen at bedtime.  He sleep with the head of his bed elevated.  Avoid sedating medications if possible  Plan Patient Instructions  Continue on Oxygen 4l/m At bedtime   Albuterol inhaler As needed   Chest xray today  Flu shot today  Follow up with Dr. Sherene Sires in 6 months with PFT (30 min slot)

## 2023-05-08 NOTE — Patient Instructions (Addendum)
Continue on Oxygen 4l/m At bedtime   Albuterol inhaler As needed   Chest xray today  Flu shot today  Follow up with Dr. Sherene Sires in 6 months with PFT (30 min slot)

## 2023-05-08 NOTE — Progress Notes (Signed)
@Patient  ID: Charles Mathews, male    DOB: [birth year], 62 y.o.   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  Spent 633.7 min with SpO2 < 89%.  CPAP titration 06/28/21 >> CPAP 11 cm H2O with 2 liters O2 >> AHI 4.3.   Cardiac Tests:  Echo 08/28/20 >> EF 55 to 60%, mild LVH    05/08/2023 Follow up : OSA/OHS, COPD , O2 RF  Patient returns for follow-up visit.  He was last seen November 2022.  Patient has a history of obstructive sleep apnea and obesity hypoventilation syndrome.  Previous home sleep study in 2022 showed severe sleep apnea with AHI at 53.3 with SpO2 low at 59%.  Patient was set up for a CPAP titration study.  This was done June 28, 2021 that showed optimal control on CPAP 11 cm H2O.  With 2 L of oxygen.  Unfortunately patient was unable to tolerate CPAP.  Says that he tried this multiple times and tried several different mask but says he absolutely cannot wear it.  It causes him to have severe anxiety.  He has significant claustrophobia and says he cannot wear this.  Due to noncompliance he had to turn his machine back into DME.  Long discussion with patient and caregiver regarding potential complications of  untreated severe sleep apnea.  Patient says he understands this but absolutely cannot wear his CPAP machine.  He is on oxygen 4 L at bedtime.  We discussed sleeping with his head of the bed elevated avoiding sleeping on his back.  Patient has a history of developmental delay mentally challenged.  He lives alone.  Has a power of attorney that helped him.  He has a CNA that comes every day.  She helps him with his grocery shopping, transportation and ADLs. Patient has a history of smoking.  Has presumed COPD.  Has not had his PFTs completed yet.  He has tried some inhalers in the past but did not feel like they made any difference in his breathing.  Has no significant cough, shortness of breath.  He has an albuterol inhaler but never uses it. Home along  Allergies  Allergen Reactions   Bee Venom Anaphylaxis   Benadryl [Diphenhydramine]     Makes him feel weird, like he is going to pass out   Lorazepam Other (See Comments)    Makes him feel woozy.   Penicillins Other (See Comments)    CHILDHOOD ALLERGY   Latex Rash    Immunization History  Administered Date(s) Administered   Influenza Split 04/01/2012, 07/07/2014   Influenza, Quadrivalent, Recombinant, Inj, Pf 04/04/2018   Influenza, Seasonal, Injecte, Preservative Fre 05/08/2023   Influenza,inj,quad, With Preservative 04/07/2017  Pneumococcal Conjugate-13 07/10/2015   Tdap 10/11/2011, 05/09/2016    Past Medical History:  Diagnosis Date   Anxiety    Arthritis    Asthma    Bipolar disorder (HCC)    Chronic back pain    COPD (chronic obstructive pulmonary disease) (HCC)    Essential hypertension    Gastric ulcer    GERD (gastroesophageal reflux disease)    Head injury    History of kidney stones    Insomnia    Major depression    Mentally challenged    Migraine    Mixed hyperlipidemia    Morbid obesity (HCC)    Neuropathy    Pre-diabetes    Pulmonary hamartoma (HCC)    Seizures (HCC)     10/27/20- last one age 16-14    Sleep apnea    Urinary urgency     Tobacco History: Social History   Tobacco Use  Smoking Status Former   Current packs/day: 0.00   Average packs/day: 2.0 packs/day for 27.0 years (54.0 ttl pk-yrs)   Types: Cigarettes   Start date: 08/02/1975   Quit date: 08/01/2002   Years since quitting: 20.7   Passive exposure: Past  Smokeless Tobacco Never  Tobacco Comments   Patient states that he never was an everyday smoker only tried it when he was 41   Verified by Power of Attorney - Jeanelle Malling   Counseling given: Not Answered Tobacco comments: Patient states that he never was an everyday smoker only tried it when he was 43 Verified by Power of Attorney - Jeanelle Malling   Outpatient Medications Prior to Visit  Medication Sig Dispense Refill   ACCU-CHEK GUIDE test strip USE ONCE DAILY TO TEST SUGAR     Accu-Chek Softclix Lancets lancets USE TO TEST ONCE DAILY     acetaminophen (TYLENOL) 650 MG CR tablet Take 1,300 mg by mouth every 8 (eight) hours as needed for pain.     albuterol (VENTOLIN HFA) 108 (90 Base) MCG/ACT inhaler Inhale 1 puff into the lungs every 6 (six) hours as needed for wheezing or shortness of breath.     aspirin EC 81 MG tablet Take 81 mg by mouth daily.     Blood Glucose Monitoring Suppl (ACCU-CHEK GUIDE) w/Device KIT USE ONCE DAILY TO TEST SUAGR     Carboxymethylcellul-Glycerin (CLEAR EYES FOR DRY EYES) 1-0.25 % SOLN Place 1 drop into both eyes daily as needed (dry eyes).     cetirizine (ZYRTEC) 10 MG tablet Take 10 mg by mouth daily.     doxazosin (CARDURA) 2 MG tablet Take 1 tablet (2 mg total) by mouth daily. 90 tablet 3   FLUoxetine (PROZAC) 20 MG capsule Take 20 mg by mouth daily.     fluticasone (FLONASE) 50 MCG/ACT nasal spray INSTILL ONE SPRAY INTO BOTH NOSTRILS DAILY 16 g 3   furosemide (LASIX) 40 MG tablet TAKE 1 TABLET BY MOUTH DAILY 90 tablet 3   gabapentin (NEURONTIN) 100 MG capsule Take 1,300 mg by mouth at bedtime.     LATUDA 40 MG TABS tablet Take by  mouth.     meclizine (ANTIVERT) 25 MG tablet Take 12.5 mg by mouth daily.     metFORMIN (GLUCOPHAGE) 500 MG tablet Take 500 mg by mouth daily.     metoprolol tartrate (LOPRESSOR) 50 MG tablet TAKE 1 TABLET BY MOUTH TWICE DAILY 180 tablet 3   Multiple Vitamin (MULTIVITAMIN) tablet Take 2 tablets by mouth daily. Gummies     potassium chloride SA (KLOR-CON  M) 20 MEQ tablet TAKE 1 TABLET BY MOUTH DAILY 90 tablet 3   rosuvastatin (CRESTOR) 40 MG tablet Take 40 mg by mouth daily.     topiramate (TOPAMAX) 50 MG tablet Take 50 mg by mouth daily.     traMADol (ULTRAM) 50 MG tablet Take 50 mg by mouth every 6 (six) hours as needed for severe pain.     traZODone (DESYREL) 100 MG tablet Take 100 mg by mouth at bedtime.     Bioflavonoid Products (VITA C/BIOFLAVONOIDS/ROSE HIPS) 1000-30-18 MG TABS Take 1 tablet by mouth daily.     Budeson-Glycopyrrol-Formoterol (BREZTRI AEROSPHERE) 160-9-4.8 MCG/ACT AERO Inhale 2 puffs into the lungs in the morning and at bedtime. (Patient not taking: Reported on 05/08/2023)     dicyclomine (BENTYL) 10 MG capsule Take 1 capsule (10 mg total) by mouth 3 (three) times daily as needed for spasms. 90 capsule 3   famotidine (PEPCID) 20 MG tablet Take 20 mg by mouth 2 (two) times daily.     sucralfate (CARAFATE) 1 GM/10ML suspension Take 10 mLs (1 g total) by mouth 4 (four) times daily -  with meals and at bedtime for 14 days. 560 mL 0   benzonatate (TESSALON) 100 MG capsule Take 100 mg by mouth 3 (three) times daily as needed. (Patient not taking: Reported on 04/19/2023)     citalopram (CELEXA) 20 MG tablet Take 20 mg by mouth daily. (Patient not taking: Reported on 05/08/2023)     esomeprazole (NEXIUM) 40 MG capsule Take 1 capsule (40 mg total) by mouth 2 (two) times daily before a meal. (Patient not taking: Reported on 05/08/2023) 60 capsule 3   Melatonin 10 MG TABS Take 10 mg by mouth at bedtime. (Patient not taking: Reported on 04/19/2023)     ondansetron (ZOFRAN ODT) 4 MG  disintegrating tablet Take 1 tablet (4 mg total) by mouth every 8 (eight) hours as needed for nausea or vomiting. (Patient not taking: Reported on 05/08/2023) 20 tablet 5   Semaglutide,0.25 or 0.5MG /DOS, (OZEMPIC, 0.25 OR 0.5 MG/DOSE,) 2 MG/1.5ML SOPN Inject 0.25 mg into the skin every Friday. (Patient not taking: Reported on 05/08/2023)     sertraline (ZOLOFT) 100 MG tablet Take 100 mg by mouth at bedtime. (Patient not taking: Reported on 05/08/2023)     Simethicone 125 MG TABS Take 1 tablet (125 mg total) by mouth 4 (four) times daily as needed. (Patient not taking: Reported on 05/08/2023) 120 tablet 1   No facility-administered medications prior to visit.     Review of Systems:   Constitutional:   No  weight loss, night sweats,  Fevers, chills, fatigue, or  lassitude.  HEENT:   No headaches,  Difficulty swallowing,  Tooth/dental problems, or  Sore throat,                No sneezing, itching, ear ache, nasal congestion, post nasal drip,   CV:  No chest pain,  Orthopnea, PND, swelling in lower extremities, anasarca, dizziness, palpitations, syncope.   GI  No heartburn, indigestion, abdominal pain, nausea, vomiting, diarrhea, change in bowel habits, loss of appetite, bloody stools.   Resp: No shortness of breath with exertion or at rest.  No excess mucus, no productive cough,  No non-productive cough,  No coughing up of blood.  No change in color of mucus.  No wheezing.  No chest wall deformity  Skin: no rash or lesions.  GU: no dysuria, change in color of urine, no urgency or frequency.  No flank pain,  no hematuria   MS:  No joint pain or swelling.  No decreased range of motion.  No back pain.    Physical Exam  BP 110/70 (BP Location: Left Arm, Patient Position: Sitting, Cuff Size: Large)   Pulse 61   Ht 5\' 9"  (1.753 m)   Wt 281 lb 6.4 oz (127.6 kg)   SpO2 94%   BMI 41.56 kg/m   GEN: A/Ox3; pleasant , NAD, well nourished    HEENT:  [place]/AT,  NOSE-clear, THROAT-clear, no lesions,  no postnasal drip or exudate noted. Class 4 MP airway   NECK:  Supple w/ fair ROM; no JVD; normal carotid impulses w/o bruits; no thyromegaly or nodules palpated; no lymphadenopathy.    RESP  Clear  P & A; w/o, wheezes/ rales/ or rhonchi. no accessory muscle use, no dullness to percussion  CARD:  RRR, no m/r/g, no peripheral edema, pulses intact, no cyanosis or clubbing.  GI:   Soft & nt; nml bowel sounds; no organomegaly or masses detected.   Musco: Warm bil, no deformities or joint swelling noted.   Neuro: alert, no focal deficits noted.    Skin: Warm, no lesions or rashes    Lab Results:    BNP No results found for: "BNP"  ProBNP No results found for: "PROBNP"  Imaging: No results found.  Administration History     None           No data to display          No results found for: "NITRICOXIDE"      Assessment & Plan:   OSA (obstructive sleep apnea) Severe obstructive sleep apnea/obesity hypoventilation syndrome-unfortunately patient's been unable to tolerate CPAP therapy.  He does not wish to restart that.  Have asked him to continue on oxygen at bedtime.  He sleep with the head of his bed elevated.  Avoid sedating medications if possible  Plan Patient Instructions  Continue on Oxygen 4l/m At bedtime   Albuterol inhaler As needed   Chest xray today  Flu shot today  Follow up with Dr. Sherene Sires in 6 months with PFT (30 min slot)        Chronic respiratory failure with hypoxia (HCC) Nocturnal hypoxemia in the setting of severe obstructive apnea/OHS.  May also have a component of COPD as he has a history of smoking.  Will check PFTs on return.  Continue on oxygen 4 L at bedtime.  Unable to tolerate CPAP therapy. Check CXR today   Plan  . Patient Instructions  Continue on Oxygen 4l/m At bedtime   Albuterol inhaler As needed   Chest xray today  Flu shot today  Follow up with Dr. Sherene Sires in 6 months with PFT (30 min slot)          Rubye Oaks, NP 05/08/2023

## 2023-05-19 ENCOUNTER — Encounter: Payer: 59 | Admitting: *Deleted

## 2023-05-23 DIAGNOSIS — J449 Chronic obstructive pulmonary disease, unspecified: Secondary | ICD-10-CM | POA: Diagnosis not present

## 2023-05-23 DIAGNOSIS — J411 Mucopurulent chronic bronchitis: Secondary | ICD-10-CM | POA: Diagnosis not present

## 2023-05-26 ENCOUNTER — Ambulatory Visit: Payer: Self-pay | Admitting: *Deleted

## 2023-05-26 ENCOUNTER — Encounter: Payer: Self-pay | Admitting: *Deleted

## 2023-05-26 NOTE — Patient Outreach (Signed)
Care Coordination   Follow Up Visit Note   05/26/2023 Name: Charles Mathews MRN: [id number] DOB: Oct 06, 1960  Charles Mathews is a 62 y.o. year old male who sees [name], MD for primary care. I  spoke with caregiver, Charles Mathews, by telephone today.  What matters to the patients health and wellness today?  No specific concerns endorsed.    Goals Addressed             This Visit's Progress    COMPLETED: Care Coordination Services-Decrease ED Visits       Care Coordination Goals: Patient will keep appointment with [facility] on 04/19/23 and [facility] on 05/08/23 Patient will discuss sleep apnea management with pulmonologist  Patient will take medication as prescribed and follow-up with counseling and psychiatry as recommended Patient will utilize Urgent Care or PCP services for acute, non-emergency illnesses or medical concerns and will reserve emergency room visits for medical emergencies Patient will reach out to PCP with any new or worsening symptoms  Patient/caregiver will reach out to RN Care Coordinator at [phone] with any resource or care coordination needs  Patient does not have any acute or urgent needs related to this goal and will follow-up with PCP regarding management.      COMPLETED: Manage COPD       Care Coordination Goals: Patient will keep all follow-up PCP and/or pulmonary appointments Patient will call provider with any new or worsening symptoms Patient will track and manage COPD triggers Patient will use medications as directed by PCP or pulmonologist  Patient will use infection prevention strategies to reduce risk of respiratory infection Patient/caregiver will refer to handout on COPD Zones as needed Patient will continue to use continuous O2 at 4 L per minute (or as directed by provider) per [place] via floor concentrator or portable tanks Patient will call RN Care Coordinator [phone] with any care coordination or  resource needs  Patient does not have any acute or urgent needs related to this goal and will follow-up with PCP regarding management.         SDOH assessments and interventions completed:  Yes  SDOH Interventions Today    Flowsheet Row Most Recent Value  SDOH Interventions   Housing Interventions Intervention Not Indicated  Transportation Interventions Intervention Not Indicated        Care Coordination Interventions:  Yes, provided  Interventions Today    Flowsheet Row Most Recent Value  Chronic Disease   Chronic disease during today's visit Chronic Obstructive Pulmonary Disease (COPD)  General Interventions   General Interventions Discussed/Reviewed General Interventions Discussed, General Interventions Reviewed, Doctor Visits  [discussed that last ED visit was over a month ago. That is a really big improvement.]  Doctor Visits Discussed/Reviewed Doctor Visits Discussed, Doctor Visits Reviewed, PCP, Specialist, Annual Wellness Visits  PCP/Specialist Visits Compliance with follow-up visit  Education Interventions   Education Provided Provided Education  Provided Verbal Education On When to see the doctor, Mental Health/Coping with Illness  Mental Health Interventions   Mental Health Discussed/Reviewed Mental Health Reviewed, Mental Health Discussed, Anxiety  [Health related anxiety seems to be improved, per caregiver. He has additional aide assistance from 7-9 PM Monday through Saturday.]  Safety Interventions   Safety Discussed/Reviewed Safety Discussed, Safety Reviewed, Fall Risk, Home Safety  [Reports one mechancial fall without injury since our last telephone call]  Home Safety Assistive Devices  Advanced Directive Interventions   Advanced Directives Discussed/Reviewed Advanced Directives Discussed, Advanced Care Planning  [encouraged to consider]  Follow up plan: No further intervention required. Patient's Primary Care office is not partnering with the [facility]  for care management and will be providing Care Management Services themselves. This final Care Management note will be securely faxed to the PCP office for handoff. Patient has been encouraged to reach out to their PCP office with any resource or care management needs.    Encounter Outcome:  Patient Visit Completed   [name], RN, BSN Care Management Coordinator [facility]  [facility] Direct Dial: [phone] Main #: [phone]

## 2023-06-01 DIAGNOSIS — I10 Essential (primary) hypertension: Secondary | ICD-10-CM | POA: Diagnosis not present

## 2023-06-01 DIAGNOSIS — E1122 Type 2 diabetes mellitus with diabetic chronic kidney disease: Secondary | ICD-10-CM | POA: Diagnosis not present

## 2023-06-01 DIAGNOSIS — K219 Gastro-esophageal reflux disease without esophagitis: Secondary | ICD-10-CM | POA: Diagnosis not present

## 2023-06-05 DIAGNOSIS — J449 Chronic obstructive pulmonary disease, unspecified: Secondary | ICD-10-CM | POA: Diagnosis not present

## 2023-06-05 DIAGNOSIS — W19XXXA Unspecified fall, initial encounter: Secondary | ICD-10-CM | POA: Diagnosis not present

## 2023-06-05 DIAGNOSIS — Z87891 Personal history of nicotine dependence: Secondary | ICD-10-CM | POA: Diagnosis not present

## 2023-06-05 DIAGNOSIS — R079 Chest pain, unspecified: Secondary | ICD-10-CM | POA: Diagnosis not present

## 2023-06-05 DIAGNOSIS — R0789 Other chest pain: Secondary | ICD-10-CM | POA: Diagnosis not present

## 2023-06-05 DIAGNOSIS — S29009A Unspecified injury of muscle and tendon of unspecified wall of thorax, initial encounter: Secondary | ICD-10-CM | POA: Diagnosis not present

## 2023-06-05 DIAGNOSIS — K219 Gastro-esophageal reflux disease without esophagitis: Secondary | ICD-10-CM | POA: Diagnosis not present

## 2023-06-05 DIAGNOSIS — J984 Other disorders of lung: Secondary | ICD-10-CM | POA: Diagnosis not present

## 2023-06-05 DIAGNOSIS — Z743 Need for continuous supervision: Secondary | ICD-10-CM | POA: Diagnosis not present

## 2023-06-05 DIAGNOSIS — R6889 Other general symptoms and signs: Secondary | ICD-10-CM | POA: Diagnosis not present

## 2023-06-05 DIAGNOSIS — M545 Low back pain, unspecified: Secondary | ICD-10-CM | POA: Diagnosis not present

## 2023-06-05 DIAGNOSIS — S0990XA Unspecified injury of head, initial encounter: Secondary | ICD-10-CM | POA: Diagnosis not present

## 2023-06-05 DIAGNOSIS — N200 Calculus of kidney: Secondary | ICD-10-CM | POA: Diagnosis not present

## 2023-06-05 DIAGNOSIS — R918 Other nonspecific abnormal finding of lung field: Secondary | ICD-10-CM | POA: Diagnosis not present

## 2023-06-05 DIAGNOSIS — I10 Essential (primary) hypertension: Secondary | ICD-10-CM | POA: Diagnosis not present

## 2023-06-05 DIAGNOSIS — K5730 Diverticulosis of large intestine without perforation or abscess without bleeding: Secondary | ICD-10-CM | POA: Diagnosis not present

## 2023-06-05 NOTE — Progress Notes (Signed)
ATC x1.  No answer.  No VM.  No Mycart set up.

## 2023-06-09 ENCOUNTER — Encounter: Payer: Self-pay | Admitting: *Deleted

## 2023-06-09 NOTE — Progress Notes (Signed)
ATC x2.  No answer.  No vm.  No mychart. Unable to reach letter sent.

## 2023-06-28 DIAGNOSIS — Z0001 Encounter for general adult medical examination with abnormal findings: Secondary | ICD-10-CM | POA: Diagnosis not present

## 2023-06-28 DIAGNOSIS — J449 Chronic obstructive pulmonary disease, unspecified: Secondary | ICD-10-CM | POA: Diagnosis not present

## 2023-06-28 DIAGNOSIS — G4733 Obstructive sleep apnea (adult) (pediatric): Secondary | ICD-10-CM | POA: Diagnosis not present

## 2023-06-28 DIAGNOSIS — E785 Hyperlipidemia, unspecified: Secondary | ICD-10-CM | POA: Diagnosis not present

## 2023-06-28 DIAGNOSIS — E1165 Type 2 diabetes mellitus with hyperglycemia: Secondary | ICD-10-CM | POA: Diagnosis not present

## 2023-06-28 DIAGNOSIS — I10 Essential (primary) hypertension: Secondary | ICD-10-CM | POA: Diagnosis not present

## 2023-06-28 DIAGNOSIS — F17210 Nicotine dependence, cigarettes, uncomplicated: Secondary | ICD-10-CM | POA: Diagnosis not present

## 2023-06-29 DIAGNOSIS — W19XXXA Unspecified fall, initial encounter: Secondary | ICD-10-CM | POA: Diagnosis not present

## 2023-06-29 DIAGNOSIS — I10 Essential (primary) hypertension: Secondary | ICD-10-CM | POA: Diagnosis not present

## 2023-06-29 DIAGNOSIS — Z91040 Latex allergy status: Secondary | ICD-10-CM | POA: Diagnosis not present

## 2023-06-29 DIAGNOSIS — J449 Chronic obstructive pulmonary disease, unspecified: Secondary | ICD-10-CM | POA: Diagnosis not present

## 2023-06-29 DIAGNOSIS — Z87891 Personal history of nicotine dependence: Secondary | ICD-10-CM | POA: Diagnosis not present

## 2023-06-29 DIAGNOSIS — Z885 Allergy status to narcotic agent status: Secondary | ICD-10-CM | POA: Diagnosis not present

## 2023-06-29 DIAGNOSIS — Z7985 Long-term (current) use of injectable non-insulin antidiabetic drugs: Secondary | ICD-10-CM | POA: Diagnosis not present

## 2023-06-29 DIAGNOSIS — S0990XA Unspecified injury of head, initial encounter: Secondary | ICD-10-CM | POA: Diagnosis not present

## 2023-06-29 DIAGNOSIS — Z743 Need for continuous supervision: Secondary | ICD-10-CM | POA: Diagnosis not present

## 2023-06-29 DIAGNOSIS — R6889 Other general symptoms and signs: Secondary | ICD-10-CM | POA: Diagnosis not present

## 2023-06-29 DIAGNOSIS — Z91012 Allergy to eggs: Secondary | ICD-10-CM | POA: Diagnosis not present

## 2023-06-29 DIAGNOSIS — Z886 Allergy status to analgesic agent status: Secondary | ICD-10-CM | POA: Diagnosis not present

## 2023-06-29 DIAGNOSIS — W01198A Fall on same level from slipping, tripping and stumbling with subsequent striking against other object, initial encounter: Secondary | ICD-10-CM | POA: Diagnosis not present

## 2023-06-29 DIAGNOSIS — R110 Nausea: Secondary | ICD-10-CM | POA: Diagnosis not present

## 2023-06-29 DIAGNOSIS — Z880 Allergy status to penicillin: Secondary | ICD-10-CM | POA: Diagnosis not present

## 2023-06-29 DIAGNOSIS — G9389 Other specified disorders of brain: Secondary | ICD-10-CM | POA: Diagnosis not present

## 2023-06-29 DIAGNOSIS — Z888 Allergy status to other drugs, medicaments and biological substances status: Secondary | ICD-10-CM | POA: Diagnosis not present

## 2023-07-03 DIAGNOSIS — I10 Essential (primary) hypertension: Secondary | ICD-10-CM | POA: Diagnosis not present

## 2023-07-03 DIAGNOSIS — E7849 Other hyperlipidemia: Secondary | ICD-10-CM | POA: Diagnosis not present

## 2023-07-03 DIAGNOSIS — Z0001 Encounter for general adult medical examination with abnormal findings: Secondary | ICD-10-CM | POA: Diagnosis not present

## 2023-07-03 DIAGNOSIS — J9611 Chronic respiratory failure with hypoxia: Secondary | ICD-10-CM | POA: Diagnosis not present

## 2023-07-03 DIAGNOSIS — J449 Chronic obstructive pulmonary disease, unspecified: Secondary | ICD-10-CM | POA: Diagnosis not present

## 2023-07-03 DIAGNOSIS — E1165 Type 2 diabetes mellitus with hyperglycemia: Secondary | ICD-10-CM | POA: Diagnosis not present

## 2023-07-03 DIAGNOSIS — E876 Hypokalemia: Secondary | ICD-10-CM | POA: Diagnosis not present

## 2023-07-03 DIAGNOSIS — Z23 Encounter for immunization: Secondary | ICD-10-CM | POA: Diagnosis not present

## 2023-07-03 DIAGNOSIS — Z1329 Encounter for screening for other suspected endocrine disorder: Secondary | ICD-10-CM | POA: Diagnosis not present

## 2023-07-03 DIAGNOSIS — K2100 Gastro-esophageal reflux disease with esophagitis, without bleeding: Secondary | ICD-10-CM | POA: Diagnosis not present

## 2023-07-19 ENCOUNTER — Ambulatory Visit: Payer: 59 | Admitting: Cardiology

## 2023-07-21 ENCOUNTER — Encounter: Payer: Self-pay | Admitting: Gastroenterology

## 2023-08-01 DIAGNOSIS — E782 Mixed hyperlipidemia: Secondary | ICD-10-CM | POA: Diagnosis not present

## 2023-08-01 DIAGNOSIS — E1122 Type 2 diabetes mellitus with diabetic chronic kidney disease: Secondary | ICD-10-CM | POA: Diagnosis not present

## 2023-08-01 DIAGNOSIS — J449 Chronic obstructive pulmonary disease, unspecified: Secondary | ICD-10-CM | POA: Diagnosis not present

## 2023-08-16 ENCOUNTER — Other Ambulatory Visit: Payer: Self-pay | Admitting: Gastroenterology

## 2023-08-25 ENCOUNTER — Encounter: Payer: Self-pay | Admitting: *Deleted

## 2023-08-28 ENCOUNTER — Encounter: Payer: Self-pay | Admitting: Cardiology

## 2023-08-28 DIAGNOSIS — Z5329 Procedure and treatment not carried out because of patient's decision for other reasons: Secondary | ICD-10-CM | POA: Diagnosis not present

## 2023-08-28 DIAGNOSIS — M7989 Other specified soft tissue disorders: Secondary | ICD-10-CM | POA: Diagnosis not present

## 2023-08-28 DIAGNOSIS — M79642 Pain in left hand: Secondary | ICD-10-CM | POA: Diagnosis not present

## 2023-08-28 DIAGNOSIS — M25522 Pain in left elbow: Secondary | ICD-10-CM | POA: Diagnosis not present

## 2023-08-28 DIAGNOSIS — R109 Unspecified abdominal pain: Secondary | ICD-10-CM | POA: Diagnosis not present

## 2023-08-28 DIAGNOSIS — M79632 Pain in left forearm: Secondary | ICD-10-CM | POA: Diagnosis not present

## 2023-08-30 ENCOUNTER — Ambulatory Visit: Payer: 59 | Attending: Cardiology | Admitting: Cardiology

## 2023-09-09 DIAGNOSIS — K219 Gastro-esophageal reflux disease without esophagitis: Secondary | ICD-10-CM | POA: Diagnosis not present

## 2023-09-09 DIAGNOSIS — N281 Cyst of kidney, acquired: Secondary | ICD-10-CM | POA: Diagnosis not present

## 2023-09-09 DIAGNOSIS — J449 Chronic obstructive pulmonary disease, unspecified: Secondary | ICD-10-CM | POA: Diagnosis not present

## 2023-09-09 DIAGNOSIS — I10 Essential (primary) hypertension: Secondary | ICD-10-CM | POA: Diagnosis not present

## 2023-09-09 DIAGNOSIS — K5900 Constipation, unspecified: Secondary | ICD-10-CM | POA: Diagnosis not present

## 2023-09-09 DIAGNOSIS — Z87891 Personal history of nicotine dependence: Secondary | ICD-10-CM | POA: Diagnosis not present

## 2023-09-09 DIAGNOSIS — R110 Nausea: Secondary | ICD-10-CM | POA: Diagnosis not present

## 2023-09-09 DIAGNOSIS — R109 Unspecified abdominal pain: Secondary | ICD-10-CM | POA: Diagnosis not present

## 2023-09-09 DIAGNOSIS — Z79899 Other long term (current) drug therapy: Secondary | ICD-10-CM | POA: Diagnosis not present

## 2023-09-09 DIAGNOSIS — R42 Dizziness and giddiness: Secondary | ICD-10-CM | POA: Diagnosis not present

## 2023-09-13 DIAGNOSIS — R110 Nausea: Secondary | ICD-10-CM | POA: Diagnosis not present

## 2023-09-13 DIAGNOSIS — R6889 Other general symptoms and signs: Secondary | ICD-10-CM | POA: Diagnosis not present

## 2023-09-13 DIAGNOSIS — Z743 Need for continuous supervision: Secondary | ICD-10-CM | POA: Diagnosis not present

## 2023-09-20 ENCOUNTER — Other Ambulatory Visit: Payer: Self-pay

## 2023-09-20 ENCOUNTER — Emergency Department (HOSPITAL_COMMUNITY)
Admission: EM | Admit: 2023-09-20 | Discharge: 2023-09-21 | Disposition: A | Discharge status: Home / Self Care | Payer: 59 | Attending: Emergency Medicine | Admitting: Emergency Medicine

## 2023-09-20 ENCOUNTER — Encounter (HOSPITAL_COMMUNITY): Payer: Self-pay | Admitting: Emergency Medicine

## 2023-09-20 ENCOUNTER — Emergency Department (HOSPITAL_COMMUNITY): Payer: 59

## 2023-09-20 DIAGNOSIS — R0989 Other specified symptoms and signs involving the circulatory and respiratory systems: Secondary | ICD-10-CM | POA: Diagnosis not present

## 2023-09-20 DIAGNOSIS — Z91040 Latex allergy status: Secondary | ICD-10-CM | POA: Insufficient documentation

## 2023-09-20 DIAGNOSIS — Z7982 Long term (current) use of aspirin: Secondary | ICD-10-CM | POA: Diagnosis not present

## 2023-09-20 DIAGNOSIS — R059 Cough, unspecified: Secondary | ICD-10-CM | POA: Diagnosis present

## 2023-09-20 DIAGNOSIS — Z7951 Long term (current) use of inhaled steroids: Secondary | ICD-10-CM | POA: Diagnosis not present

## 2023-09-20 DIAGNOSIS — R6889 Other general symptoms and signs: Secondary | ICD-10-CM | POA: Diagnosis not present

## 2023-09-20 DIAGNOSIS — R079 Chest pain, unspecified: Secondary | ICD-10-CM | POA: Diagnosis not present

## 2023-09-20 DIAGNOSIS — Z743 Need for continuous supervision: Secondary | ICD-10-CM | POA: Diagnosis not present

## 2023-09-20 DIAGNOSIS — R918 Other nonspecific abnormal finding of lung field: Secondary | ICD-10-CM | POA: Diagnosis not present

## 2023-09-20 DIAGNOSIS — R9431 Abnormal electrocardiogram [ECG] [EKG]: Secondary | ICD-10-CM | POA: Diagnosis not present

## 2023-09-20 DIAGNOSIS — I499 Cardiac arrhythmia, unspecified: Secondary | ICD-10-CM | POA: Diagnosis not present

## 2023-09-20 DIAGNOSIS — J441 Chronic obstructive pulmonary disease with (acute) exacerbation: Secondary | ICD-10-CM | POA: Insufficient documentation

## 2023-09-20 DIAGNOSIS — I517 Cardiomegaly: Secondary | ICD-10-CM | POA: Diagnosis not present

## 2023-09-20 LAB — RESP PANEL BY RT-PCR (RSV, FLU A&B, COVID)  RVPGX2
Influenza A by PCR: NEGATIVE
Influenza B by PCR: NEGATIVE
Resp Syncytial Virus by PCR: NEGATIVE
SARS Coronavirus 2 by RT PCR: NEGATIVE

## 2023-09-20 LAB — CBC
HCT: 45.3 % (ref 39.0–52.0)
Hemoglobin: 14 g/dL (ref 13.0–17.0)
MCH: 28.6 pg (ref 26.0–34.0)
MCHC: 30.9 g/dL (ref 30.0–36.0)
MCV: 92.4 fL (ref 80.0–100.0)
Platelets: 139 10*3/uL — ABNORMAL LOW (ref 150–400)
RBC: 4.9 MIL/uL (ref 4.22–5.81)
RDW: 12.6 % (ref 11.5–15.5)
WBC: 10 10*3/uL (ref 4.0–10.5)
nRBC: 0 % (ref 0.0–0.2)

## 2023-09-20 LAB — COMPREHENSIVE METABOLIC PANEL
ALT: 11 U/L (ref 0–44)
AST: 20 U/L (ref 15–41)
Albumin: 3.5 g/dL (ref 3.5–5.0)
Alkaline Phosphatase: 42 U/L (ref 38–126)
Anion gap: 9 (ref 5–15)
BUN: 19 mg/dL (ref 8–23)
CO2: 26 mmol/L (ref 22–32)
Calcium: 8.9 mg/dL (ref 8.9–10.3)
Chloride: 108 mmol/L (ref 98–111)
Creatinine, Ser: 1.32 mg/dL — ABNORMAL HIGH (ref 0.61–1.24)
GFR, Estimated: >60 mL/min (ref >60)
Glucose, Bld: 144 mg/dL — ABNORMAL HIGH (ref 70–99)
Potassium: 5.1 mmol/L (ref 3.5–5.1)
Sodium: 143 mmol/L (ref 135–145)
Total Bilirubin: 0.8 mg/dL (ref 0.0–1.2)
Total Protein: 6.3 g/dL — ABNORMAL LOW (ref 6.5–8.1)

## 2023-09-20 LAB — TROPONIN I (HIGH SENSITIVITY)
Troponin I (High Sensitivity): 4 ng/L (ref <18)
Troponin I (High Sensitivity): 5 ng/L (ref <18)

## 2023-09-20 MED ORDER — PREDNISONE 20 MG PO TABS: ORAL_TABLET | ORAL | 0 refills | Status: DC

## 2023-09-20 MED ORDER — METHYLPREDNISOLONE SODIUM SUCC 125 MG IJ SOLR
125.0000 mg | Freq: Once | INTRAMUSCULAR | Status: AC
  Administered 2023-09-20: 125 mg via INTRAVENOUS
  Filled 2023-09-20: qty 2

## 2023-09-20 NOTE — Discharge Instructions (Signed)
 Return if any problems.

## 2023-09-20 NOTE — ED Triage Notes (Signed)
Patient BIB [facility] from home c/o feeling generalized weakness, nausea, chest pain and diarrhea that started at 0800.  Patient has a history of HTN but doesn't remember if he took his metoprolol this AM.  98.8 CBG 157 208/144 96% 3 L [place] chronic 57 HR  325 mg asa

## 2023-09-20 NOTE — ED Provider Notes (Signed)
[place] EMERGENCY DEPARTMENT AT [facility] Provider Note   CSN: [id number] Arrival date & time: 09/20/23  1944     History  Chief Complaint  Patient presents with   Chest Pain    Charles Mathews is a 63 y.o. male.  Pt complains of shortness of breath and  chest pain.  Pt has a history of COPD.  Pt reports he feels he feels like his COPD.  Patient denies any fever or chills.  Patient reports he has been coughing.  Patient states that he had some pain in his chest today.  Patient denies any abdominal pain.  Patient has a past medical history of COPD, lung mass and bronchitis.  Patient is on home oxygen at 3 L.  Patient denies any exposure to flu or COVID.  Patient is not having any abdominal pain he denies any nausea vomiting or diarrhea.  The history is provided by the patient. No language interpreter was used.  Chest Pain      Home Medications Prior to Admission medications   Medication Sig Start Date End Date Taking? Authorizing Provider  ACCU-CHEK GUIDE test strip USE ONCE DAILY TO TEST SUGAR 03/28/19   [provider]  Accu-Chek Softclix Lancets lancets USE TO TEST ONCE DAILY 03/28/19   [provider]  acetaminophen (TYLENOL) 650 MG CR tablet Take 1,300 mg by mouth every 8 (eight) hours as needed for pain.    [provider]  albuterol (VENTOLIN HFA) 108 (90 Base) MCG/ACT inhaler Inhale 1 puff into the lungs every 6 (six) hours as needed for wheezing or shortness of breath. 07/02/19   [provider]  aspirin EC 81 MG tablet Take 81 mg by mouth daily.    [provider]  Bioflavonoid Products (VITA C/BIOFLAVONOIDS/ROSE HIPS) 1000-30-18 MG TABS Take 1 tablet by mouth daily. 04/19/22   [provider]  Blood Glucose Monitoring Suppl (ACCU-CHEK GUIDE) w/Device KIT USE ONCE DAILY TO TEST SUAGR 03/28/19   [provider]  Budeson-Glycopyrrol-Formoterol (BREZTRI AEROSPHERE) 160-9-4.8 MCG/ACT AERO Inhale 2 puffs  into the lungs in the morning and at bedtime. Patient not taking: Reported on 05/08/2023    [provider]  candesartan (ATACAND) 16 MG tablet Take by mouth. 07/27/23   [provider]  Carboxymethylcellul-Glycerin (CLEAR EYES FOR DRY EYES) 1-0.25 % SOLN Place 1 drop into both eyes daily as needed (dry eyes).    [provider]  cetirizine (ZYRTEC) 10 MG tablet Take 10 mg by mouth daily.    [provider]  dicyclomine (BENTYL) 10 MG capsule Take 1 capsule (10 mg total) by mouth 3 (three) times daily as needed for spasms. 06/21/21   Carlan, Chelsea L, NP  doxazosin (CARDURA) 2 MG tablet Take 1 tablet (2 mg total) by mouth daily. 10/21/22   Antoine Poche, MD  esomeprazole (NEXIUM) 40 MG capsule TAKE ONE CAPSULE BY MOUTH TWICE DAILY BEFORE MEALS 08/16/23   Aida Raider, NP  famotidine (PEPCID) 20 MG tablet Take 20 mg by mouth 2 (two) times daily. 05/28/22   [provider]  FLUoxetine (PROZAC) 20 MG capsule Take 20 mg by mouth daily. 04/24/23   [provider]  fluticasone (FLONASE) 50 MCG/ACT nasal spray INSTILL ONE SPRAY INTO BOTH NOSTRILS DAILY 06/22/22   Coralyn Helling, MD  furosemide (LASIX) 40 MG tablet TAKE 1 TABLET BY MOUTH DAILY 10/21/22   Antoine Poche, MD  gabapentin (NEURONTIN) 100 MG capsule Take 1,300 mg by mouth at bedtime.  01/01/21   [provider]  hydrOXYzine (ATARAX) 25 MG tablet Take 25 mg by mouth daily. 08/14/23   [provider]  LATUDA 40 MG TABS tablet Take by mouth. 08/14/22   [provider]  meclizine (ANTIVERT) 25 MG tablet Take 12.5 mg by mouth daily.    [provider]  metFORMIN (GLUCOPHAGE) 500 MG tablet Take 500 mg by mouth daily. 10/12/20   [provider]  metoprolol tartrate (LOPRESSOR) 50 MG tablet TAKE 1 TABLET BY MOUTH TWICE DAILY 10/21/22   Antoine Poche, MD  Multiple Vitamin (MULTIVITAMIN) tablet Take 2 tablets by mouth daily. Gummies    [provider]  OZEMPIC, 0.25 OR 0.5 MG/DOSE, 2 MG/3ML SOPN Inject 0.5 mg into the skin once a week.    [provider]  potassium chloride SA (KLOR-CON M) 20 MEQ tablet TAKE 1 TABLET BY MOUTH DAILY 10/21/22   Antoine Poche, MD  rosuvastatin (CRESTOR) 40 MG tablet Take 40 mg by mouth daily.    [provider]  sucralfate (CARAFATE) 1 GM/10ML suspension Take 10 mLs (1 g total) by mouth 4 (four) times daily -  with meals and at bedtime for 14 days. 04/19/23 05/03/23  Aida Raider, NP  topiramate (TOPAMAX) 50 MG tablet Take 50 mg by mouth daily.    [provider]  traMADol (ULTRAM) 50 MG tablet Take 50 mg by mouth every 6 (six) hours as needed for severe pain.    [provider]  traZODone (DESYREL) 100 MG tablet Take 100 mg by mouth at bedtime. 06/12/19   [provider]      Allergies    Bee venom, Benadryl [diphenhydramine], Lorazepam, Penicillins, and Latex    Review of Systems   Review of Systems  Cardiovascular:  Positive for chest pain.  All other systems reviewed and are negative.   Physical Exam Updated Vital Signs BP (!) 150/109   Pulse (!) 55   Temp 99.1 F (37.3 C) (Oral)   Resp 19   Wt 129 kg   SpO2 99%   BMI 42.00 kg/m  Physical Exam Vitals and nursing note reviewed.  Constitutional:      Appearance: He is well-developed.  HENT:     Head: Normocephalic.  Cardiovascular:     Rate and Rhythm: Normal rate.     Heart sounds: Normal heart sounds.  Pulmonary:     Effort: Pulmonary effort is normal.  Abdominal:     General: Bowel sounds are normal. There is no distension.     Palpations: Abdomen is soft.  Musculoskeletal:        General: Normal range of motion.     Cervical back: Normal range of motion.  Skin:    General: Skin is warm.  Neurological:     General: No focal deficit present.     Mental Status: He is alert and oriented to person, place, and time.     ED Results / Procedures / Treatments    Labs (all labs ordered are listed, but only abnormal results are displayed) Labs Reviewed  CBC - Abnormal; Notable for the following components:      Result Value   Platelets 139 (*)    All other components within normal limits  COMPREHENSIVE METABOLIC PANEL - Abnormal; Notable for the following components:   Glucose, Bld 144 (*)    Creatinine, Ser 1.32 (*)    Total Protein 6.3 (*)    All other components within normal limits  RESP PANEL BY RT-PCR (RSV, FLU A&B, COVID)  [id number]  TROPONIN I (HIGH SENSITIVITY)  TROPONIN I (HIGH SENSITIVITY)    EKG EKG Interpretation Date/Time:  Wednesday September 20 2023 19:53:38 EST Ventricular Rate:  58 PR Interval:  168 QRS Duration:  102 QT Interval:  398 QTC Calculation: 391 R Axis:   40  Text Interpretation: Sinus rhythm RSR' in V1 or V2, right VCD or RVH Confirmed by Glyn Ade [phone]) on 09/20/2023 10:36:59 PM  Radiology DG Chest 2 View Result Date: 09/20/2023 CLINICAL DATA:  Chest pain EXAM: CHEST - 2 VIEW COMPARISON:  05/08/2023, CT chest 02/04/2021 FINDINGS: Right lower lobe pulmonary mass measuring 4.3 cm. Cardiomegaly with vascular congestion. No acute airspace disease, pleural effusion or pneumothorax IMPRESSION: 1. Cardiomegaly with vascular congestion. 2. 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 Electronically Signed   By: Jasmine Pang M.D.   On: 09/20/2023 20:22    Procedures Procedures    Medications Ordered in ED Medications  methylPREDNISolone sodium succinate (SOLU-MEDROL) 125 mg/2 mL injection 125 mg (has no administration in time range)    ED Course/ Medical Decision Making/ A&P                                 Medical Decision Making Patient complains of increased shortness of breath.  Patient is on home O2 he has a past medical history of COPD.  Amount and/or Complexity of Data Reviewed Independent Historian: EMS     Details: Patient is brought in by EMS. External Data Reviewed: notes.    Details: Primary care notes reviewed. Labs: ordered. Decision-making details documented in ED Course.    Details: Labs reviewed and interpreted flu COVID and RSV are negative troponin is negative x 2. Radiology: ordered and independent interpretation performed. Decision-making details documented in ED Course.    Details: Chest x-ray shows cardiomegaly, mass which has been seen on previous evaluation and recorded in history ECG/medicine tests: ordered and independent interpretation performed. Decision-making details documented in ED Course.    Details: EKG shows normal sinus, no acute findings.  Risk Prescription drug management. Risk Details: Pt does not have 02 here with him.  He is on 3 liters at home.  Pt will require transport home by PTar           Final Clinical Impression(s) / ED Diagnoses Final diagnoses:  COPD with acute exacerbation (HCC)     Rx / DC Orders ED Discharge Orders          Ordered    predniSONE (DELTASONE) 20 MG tablet        09/20/23 2345           An After Visit Summary was printed and given to the patient.     Elson Areas, PA-C 09/20/23 2357    Glyn Ade, MD 09/21/23 1501

## 2023-09-21 DIAGNOSIS — Z743 Need for continuous supervision: Secondary | ICD-10-CM | POA: Diagnosis not present

## 2023-09-21 DIAGNOSIS — Z7401 Bed confinement status: Secondary | ICD-10-CM | POA: Diagnosis not present

## 2023-09-21 DIAGNOSIS — R079 Chest pain, unspecified: Secondary | ICD-10-CM | POA: Diagnosis not present

## 2023-09-29 DIAGNOSIS — E782 Mixed hyperlipidemia: Secondary | ICD-10-CM | POA: Diagnosis not present

## 2023-09-29 DIAGNOSIS — J449 Chronic obstructive pulmonary disease, unspecified: Secondary | ICD-10-CM | POA: Diagnosis not present

## 2023-09-29 DIAGNOSIS — E1122 Type 2 diabetes mellitus with diabetic chronic kidney disease: Secondary | ICD-10-CM | POA: Diagnosis not present

## 2023-09-29 DIAGNOSIS — I10 Essential (primary) hypertension: Secondary | ICD-10-CM | POA: Diagnosis not present

## 2023-10-02 DIAGNOSIS — Z743 Need for continuous supervision: Secondary | ICD-10-CM | POA: Diagnosis not present

## 2023-10-02 DIAGNOSIS — R109 Unspecified abdominal pain: Secondary | ICD-10-CM | POA: Diagnosis not present

## 2023-10-02 DIAGNOSIS — Z5321 Procedure and treatment not carried out due to patient leaving prior to being seen by health care provider: Secondary | ICD-10-CM | POA: Diagnosis not present

## 2023-10-02 DIAGNOSIS — R6889 Other general symptoms and signs: Secondary | ICD-10-CM | POA: Diagnosis not present

## 2023-10-02 DIAGNOSIS — R300 Dysuria: Secondary | ICD-10-CM | POA: Diagnosis not present

## 2023-10-15 ENCOUNTER — Other Ambulatory Visit: Payer: Self-pay | Admitting: Cardiology

## 2023-10-18 ENCOUNTER — Other Ambulatory Visit: Payer: Self-pay | Admitting: Cardiology

## 2023-10-21 DIAGNOSIS — Z87891 Personal history of nicotine dependence: Secondary | ICD-10-CM | POA: Diagnosis not present

## 2023-10-21 DIAGNOSIS — R6889 Other general symptoms and signs: Secondary | ICD-10-CM | POA: Diagnosis not present

## 2023-10-21 DIAGNOSIS — Z91040 Latex allergy status: Secondary | ICD-10-CM | POA: Diagnosis not present

## 2023-10-21 DIAGNOSIS — Z886 Allergy status to analgesic agent status: Secondary | ICD-10-CM | POA: Diagnosis not present

## 2023-10-21 DIAGNOSIS — J449 Chronic obstructive pulmonary disease, unspecified: Secondary | ICD-10-CM | POA: Diagnosis not present

## 2023-10-21 DIAGNOSIS — Z79899 Other long term (current) drug therapy: Secondary | ICD-10-CM | POA: Diagnosis not present

## 2023-10-21 DIAGNOSIS — Z20822 Contact with and (suspected) exposure to covid-19: Secondary | ICD-10-CM | POA: Diagnosis not present

## 2023-10-21 DIAGNOSIS — R110 Nausea: Secondary | ICD-10-CM | POA: Diagnosis not present

## 2023-10-21 DIAGNOSIS — I10 Essential (primary) hypertension: Secondary | ICD-10-CM | POA: Diagnosis not present

## 2023-10-21 DIAGNOSIS — R251 Tremor, unspecified: Secondary | ICD-10-CM | POA: Diagnosis not present

## 2023-10-21 DIAGNOSIS — Z743 Need for continuous supervision: Secondary | ICD-10-CM | POA: Diagnosis not present

## 2023-10-21 DIAGNOSIS — Z91012 Allergy to eggs: Secondary | ICD-10-CM | POA: Diagnosis not present

## 2023-10-29 DIAGNOSIS — Z20822 Contact with and (suspected) exposure to covid-19: Secondary | ICD-10-CM | POA: Diagnosis not present

## 2023-10-29 DIAGNOSIS — J449 Chronic obstructive pulmonary disease, unspecified: Secondary | ICD-10-CM | POA: Diagnosis not present

## 2023-10-29 DIAGNOSIS — Z87891 Personal history of nicotine dependence: Secondary | ICD-10-CM | POA: Diagnosis not present

## 2023-10-29 DIAGNOSIS — I10 Essential (primary) hypertension: Secondary | ICD-10-CM | POA: Diagnosis not present

## 2023-10-29 DIAGNOSIS — R110 Nausea: Secondary | ICD-10-CM | POA: Diagnosis not present

## 2023-10-29 DIAGNOSIS — Z91012 Allergy to eggs: Secondary | ICD-10-CM | POA: Diagnosis not present

## 2023-10-29 DIAGNOSIS — Z79899 Other long term (current) drug therapy: Secondary | ICD-10-CM | POA: Diagnosis not present

## 2023-10-29 DIAGNOSIS — Z91040 Latex allergy status: Secondary | ICD-10-CM | POA: Diagnosis not present

## 2023-10-29 DIAGNOSIS — Z886 Allergy status to analgesic agent status: Secondary | ICD-10-CM | POA: Diagnosis not present

## 2023-10-29 DIAGNOSIS — R531 Weakness: Secondary | ICD-10-CM | POA: Diagnosis not present

## 2023-10-29 DIAGNOSIS — Z743 Need for continuous supervision: Secondary | ICD-10-CM | POA: Diagnosis not present

## 2023-10-29 DIAGNOSIS — Z888 Allergy status to other drugs, medicaments and biological substances status: Secondary | ICD-10-CM | POA: Diagnosis not present

## 2023-10-29 DIAGNOSIS — Z885 Allergy status to narcotic agent status: Secondary | ICD-10-CM | POA: Diagnosis not present

## 2023-10-29 DIAGNOSIS — Z880 Allergy status to penicillin: Secondary | ICD-10-CM | POA: Diagnosis not present

## 2023-10-30 DIAGNOSIS — J449 Chronic obstructive pulmonary disease, unspecified: Secondary | ICD-10-CM | POA: Diagnosis not present

## 2023-10-30 DIAGNOSIS — I10 Essential (primary) hypertension: Secondary | ICD-10-CM | POA: Diagnosis not present

## 2023-10-30 DIAGNOSIS — E1122 Type 2 diabetes mellitus with diabetic chronic kidney disease: Secondary | ICD-10-CM | POA: Diagnosis not present

## 2023-10-30 DIAGNOSIS — E782 Mixed hyperlipidemia: Secondary | ICD-10-CM | POA: Diagnosis not present

## 2023-10-31 ENCOUNTER — Telehealth: Payer: Self-pay | Admitting: Adult Health

## 2023-10-31 NOTE — Telephone Encounter (Signed)
 Called patient to get PFT re schedule. He would like for it to be scheduled in [place] since it is closer. Please contact CNA to get scheduled [phone]

## 2023-11-01 NOTE — Telephone Encounter (Signed)
 Left a Voicemail for the CNA to callback for more information on the appointment.

## 2023-11-01 NOTE — Telephone Encounter (Signed)
 Pt has been rescheduled for [place].

## 2023-11-05 DIAGNOSIS — R6889 Other general symptoms and signs: Secondary | ICD-10-CM | POA: Diagnosis not present

## 2023-11-05 DIAGNOSIS — R109 Unspecified abdominal pain: Secondary | ICD-10-CM | POA: Diagnosis not present

## 2023-11-05 DIAGNOSIS — R1084 Generalized abdominal pain: Secondary | ICD-10-CM | POA: Diagnosis not present

## 2023-11-05 DIAGNOSIS — R197 Diarrhea, unspecified: Secondary | ICD-10-CM | POA: Diagnosis not present

## 2023-11-05 DIAGNOSIS — Z7689 Persons encountering health services in other specified circumstances: Secondary | ICD-10-CM | POA: Diagnosis not present

## 2023-11-05 DIAGNOSIS — R110 Nausea: Secondary | ICD-10-CM | POA: Diagnosis not present

## 2023-11-05 DIAGNOSIS — Z87891 Personal history of nicotine dependence: Secondary | ICD-10-CM | POA: Diagnosis not present

## 2023-11-05 DIAGNOSIS — Z880 Allergy status to penicillin: Secondary | ICD-10-CM | POA: Diagnosis not present

## 2023-11-05 DIAGNOSIS — Z888 Allergy status to other drugs, medicaments and biological substances status: Secondary | ICD-10-CM | POA: Diagnosis not present

## 2023-11-05 DIAGNOSIS — Z91040 Latex allergy status: Secondary | ICD-10-CM | POA: Diagnosis not present

## 2023-11-05 DIAGNOSIS — G8929 Other chronic pain: Secondary | ICD-10-CM | POA: Diagnosis not present

## 2023-11-05 DIAGNOSIS — Z885 Allergy status to narcotic agent status: Secondary | ICD-10-CM | POA: Diagnosis not present

## 2023-11-05 DIAGNOSIS — Z886 Allergy status to analgesic agent status: Secondary | ICD-10-CM | POA: Diagnosis not present

## 2023-11-05 DIAGNOSIS — K219 Gastro-esophageal reflux disease without esophagitis: Secondary | ICD-10-CM | POA: Diagnosis not present

## 2023-11-05 DIAGNOSIS — Z91012 Allergy to eggs: Secondary | ICD-10-CM | POA: Diagnosis not present

## 2023-11-05 DIAGNOSIS — Z79899 Other long term (current) drug therapy: Secondary | ICD-10-CM | POA: Diagnosis not present

## 2023-11-05 DIAGNOSIS — Z743 Need for continuous supervision: Secondary | ICD-10-CM | POA: Diagnosis not present

## 2023-11-05 DIAGNOSIS — I10 Essential (primary) hypertension: Secondary | ICD-10-CM | POA: Diagnosis not present

## 2023-11-05 DIAGNOSIS — J449 Chronic obstructive pulmonary disease, unspecified: Secondary | ICD-10-CM | POA: Diagnosis not present

## 2023-11-10 DIAGNOSIS — Z87891 Personal history of nicotine dependence: Secondary | ICD-10-CM | POA: Diagnosis not present

## 2023-11-10 DIAGNOSIS — Z886 Allergy status to analgesic agent status: Secondary | ICD-10-CM | POA: Diagnosis not present

## 2023-11-10 DIAGNOSIS — R110 Nausea: Secondary | ICD-10-CM | POA: Diagnosis not present

## 2023-11-10 DIAGNOSIS — I10 Essential (primary) hypertension: Secondary | ICD-10-CM | POA: Diagnosis not present

## 2023-11-10 DIAGNOSIS — R531 Weakness: Secondary | ICD-10-CM | POA: Diagnosis not present

## 2023-11-10 DIAGNOSIS — Z888 Allergy status to other drugs, medicaments and biological substances status: Secondary | ICD-10-CM | POA: Diagnosis not present

## 2023-11-10 DIAGNOSIS — Z743 Need for continuous supervision: Secondary | ICD-10-CM | POA: Diagnosis not present

## 2023-11-10 DIAGNOSIS — J449 Chronic obstructive pulmonary disease, unspecified: Secondary | ICD-10-CM | POA: Diagnosis not present

## 2023-11-10 DIAGNOSIS — Z91040 Latex allergy status: Secondary | ICD-10-CM | POA: Diagnosis not present

## 2023-11-10 DIAGNOSIS — Z91030 Bee allergy status: Secondary | ICD-10-CM | POA: Diagnosis not present

## 2023-11-10 DIAGNOSIS — Z885 Allergy status to narcotic agent status: Secondary | ICD-10-CM | POA: Diagnosis not present

## 2023-11-10 DIAGNOSIS — Z880 Allergy status to penicillin: Secondary | ICD-10-CM | POA: Diagnosis not present

## 2023-11-10 DIAGNOSIS — Z91012 Allergy to eggs: Secondary | ICD-10-CM | POA: Diagnosis not present

## 2023-11-14 ENCOUNTER — Other Ambulatory Visit: Payer: Self-pay | Admitting: Cardiology

## 2023-11-19 DIAGNOSIS — K219 Gastro-esophageal reflux disease without esophagitis: Secondary | ICD-10-CM | POA: Diagnosis not present

## 2023-11-19 DIAGNOSIS — J449 Chronic obstructive pulmonary disease, unspecified: Secondary | ICD-10-CM | POA: Diagnosis not present

## 2023-11-19 DIAGNOSIS — Z743 Need for continuous supervision: Secondary | ICD-10-CM | POA: Diagnosis not present

## 2023-11-19 DIAGNOSIS — Z880 Allergy status to penicillin: Secondary | ICD-10-CM | POA: Diagnosis not present

## 2023-11-19 DIAGNOSIS — Z886 Allergy status to analgesic agent status: Secondary | ICD-10-CM | POA: Diagnosis not present

## 2023-11-19 DIAGNOSIS — Z87891 Personal history of nicotine dependence: Secondary | ICD-10-CM | POA: Diagnosis not present

## 2023-11-19 DIAGNOSIS — I10 Essential (primary) hypertension: Secondary | ICD-10-CM | POA: Diagnosis not present

## 2023-11-19 DIAGNOSIS — R110 Nausea: Secondary | ICD-10-CM | POA: Diagnosis not present

## 2023-11-19 DIAGNOSIS — Z885 Allergy status to narcotic agent status: Secondary | ICD-10-CM | POA: Diagnosis not present

## 2023-11-19 DIAGNOSIS — Z91012 Allergy to eggs: Secondary | ICD-10-CM | POA: Diagnosis not present

## 2023-11-19 DIAGNOSIS — Z888 Allergy status to other drugs, medicaments and biological substances status: Secondary | ICD-10-CM | POA: Diagnosis not present

## 2023-11-19 DIAGNOSIS — Z91040 Latex allergy status: Secondary | ICD-10-CM | POA: Diagnosis not present

## 2023-11-19 DIAGNOSIS — R6889 Other general symptoms and signs: Secondary | ICD-10-CM | POA: Diagnosis not present

## 2023-11-21 ENCOUNTER — Encounter (HOSPITAL_BASED_OUTPATIENT_CLINIC_OR_DEPARTMENT_OTHER)

## 2023-11-22 DIAGNOSIS — G4489 Other headache syndrome: Secondary | ICD-10-CM | POA: Diagnosis not present

## 2023-11-22 DIAGNOSIS — R197 Diarrhea, unspecified: Secondary | ICD-10-CM | POA: Diagnosis not present

## 2023-11-22 DIAGNOSIS — R109 Unspecified abdominal pain: Secondary | ICD-10-CM | POA: Diagnosis not present

## 2023-11-22 DIAGNOSIS — R1084 Generalized abdominal pain: Secondary | ICD-10-CM | POA: Diagnosis not present

## 2023-11-28 ENCOUNTER — Other Ambulatory Visit: Payer: Self-pay | Admitting: Cardiology

## 2023-11-29 DIAGNOSIS — J449 Chronic obstructive pulmonary disease, unspecified: Secondary | ICD-10-CM | POA: Diagnosis not present

## 2023-11-29 DIAGNOSIS — E1122 Type 2 diabetes mellitus with diabetic chronic kidney disease: Secondary | ICD-10-CM | POA: Diagnosis not present

## 2023-11-29 DIAGNOSIS — I10 Essential (primary) hypertension: Secondary | ICD-10-CM | POA: Diagnosis not present

## 2023-11-29 DIAGNOSIS — E782 Mixed hyperlipidemia: Secondary | ICD-10-CM | POA: Diagnosis not present

## 2023-12-04 DIAGNOSIS — E7849 Other hyperlipidemia: Secondary | ICD-10-CM | POA: Diagnosis not present

## 2023-12-04 DIAGNOSIS — J449 Chronic obstructive pulmonary disease, unspecified: Secondary | ICD-10-CM | POA: Diagnosis not present

## 2023-12-04 DIAGNOSIS — E782 Mixed hyperlipidemia: Secondary | ICD-10-CM | POA: Diagnosis not present

## 2023-12-04 DIAGNOSIS — E1122 Type 2 diabetes mellitus with diabetic chronic kidney disease: Secondary | ICD-10-CM | POA: Diagnosis not present

## 2023-12-04 DIAGNOSIS — I10 Essential (primary) hypertension: Secondary | ICD-10-CM | POA: Diagnosis not present

## 2023-12-04 DIAGNOSIS — Z1329 Encounter for screening for other suspected endocrine disorder: Secondary | ICD-10-CM | POA: Diagnosis not present

## 2023-12-07 ENCOUNTER — Ambulatory Visit (HOSPITAL_COMMUNITY): Admission: RE | Admit: 2023-12-07 | Source: Ambulatory Visit

## 2023-12-12 ENCOUNTER — Other Ambulatory Visit: Payer: Self-pay | Admitting: Gastroenterology

## 2023-12-25 ENCOUNTER — Other Ambulatory Visit: Payer: Self-pay | Admitting: Cardiology

## 2023-12-29 DIAGNOSIS — I10 Essential (primary) hypertension: Secondary | ICD-10-CM | POA: Diagnosis not present

## 2023-12-29 DIAGNOSIS — J449 Chronic obstructive pulmonary disease, unspecified: Secondary | ICD-10-CM | POA: Diagnosis not present

## 2023-12-29 DIAGNOSIS — E782 Mixed hyperlipidemia: Secondary | ICD-10-CM | POA: Diagnosis not present

## 2023-12-29 DIAGNOSIS — E1122 Type 2 diabetes mellitus with diabetic chronic kidney disease: Secondary | ICD-10-CM | POA: Diagnosis not present

## 2024-01-02 ENCOUNTER — Other Ambulatory Visit: Payer: Self-pay | Admitting: Nurse Practitioner

## 2024-01-28 DIAGNOSIS — K219 Gastro-esophageal reflux disease without esophagitis: Secondary | ICD-10-CM | POA: Diagnosis not present

## 2024-01-28 DIAGNOSIS — R6889 Other general symptoms and signs: Secondary | ICD-10-CM | POA: Diagnosis not present

## 2024-01-28 DIAGNOSIS — R109 Unspecified abdominal pain: Secondary | ICD-10-CM | POA: Diagnosis not present

## 2024-01-28 DIAGNOSIS — R197 Diarrhea, unspecified: Secondary | ICD-10-CM | POA: Diagnosis not present

## 2024-01-28 DIAGNOSIS — J449 Chronic obstructive pulmonary disease, unspecified: Secondary | ICD-10-CM | POA: Diagnosis not present

## 2024-01-28 DIAGNOSIS — Z87891 Personal history of nicotine dependence: Secondary | ICD-10-CM | POA: Diagnosis not present

## 2024-01-28 DIAGNOSIS — Z7985 Long-term (current) use of injectable non-insulin antidiabetic drugs: Secondary | ICD-10-CM | POA: Diagnosis not present

## 2024-01-28 DIAGNOSIS — Z91012 Allergy to eggs: Secondary | ICD-10-CM | POA: Diagnosis not present

## 2024-01-28 DIAGNOSIS — Z886 Allergy status to analgesic agent status: Secondary | ICD-10-CM | POA: Diagnosis not present

## 2024-01-28 DIAGNOSIS — I10 Essential (primary) hypertension: Secondary | ICD-10-CM | POA: Diagnosis not present

## 2024-01-28 DIAGNOSIS — Z888 Allergy status to other drugs, medicaments and biological substances status: Secondary | ICD-10-CM | POA: Diagnosis not present

## 2024-01-28 DIAGNOSIS — Z91040 Latex allergy status: Secondary | ICD-10-CM | POA: Diagnosis not present

## 2024-01-28 DIAGNOSIS — Z880 Allergy status to penicillin: Secondary | ICD-10-CM | POA: Diagnosis not present

## 2024-01-28 DIAGNOSIS — Z885 Allergy status to narcotic agent status: Secondary | ICD-10-CM | POA: Diagnosis not present

## 2024-01-28 DIAGNOSIS — Z7951 Long term (current) use of inhaled steroids: Secondary | ICD-10-CM | POA: Diagnosis not present

## 2024-01-29 DIAGNOSIS — E782 Mixed hyperlipidemia: Secondary | ICD-10-CM | POA: Diagnosis not present

## 2024-01-29 DIAGNOSIS — I10 Essential (primary) hypertension: Secondary | ICD-10-CM | POA: Diagnosis not present

## 2024-01-29 DIAGNOSIS — J449 Chronic obstructive pulmonary disease, unspecified: Secondary | ICD-10-CM | POA: Diagnosis not present

## 2024-01-29 DIAGNOSIS — E1122 Type 2 diabetes mellitus with diabetic chronic kidney disease: Secondary | ICD-10-CM | POA: Diagnosis not present

## 2024-02-18 DIAGNOSIS — D171 Benign lipomatous neoplasm of skin and subcutaneous tissue of trunk: Secondary | ICD-10-CM | POA: Diagnosis not present

## 2024-02-18 DIAGNOSIS — D179 Benign lipomatous neoplasm, unspecified: Secondary | ICD-10-CM | POA: Diagnosis not present

## 2024-02-18 DIAGNOSIS — Z743 Need for continuous supervision: Secondary | ICD-10-CM | POA: Diagnosis not present

## 2024-02-18 DIAGNOSIS — Z91030 Bee allergy status: Secondary | ICD-10-CM | POA: Diagnosis not present

## 2024-02-18 DIAGNOSIS — Z886 Allergy status to analgesic agent status: Secondary | ICD-10-CM | POA: Diagnosis not present

## 2024-02-18 DIAGNOSIS — K5730 Diverticulosis of large intestine without perforation or abscess without bleeding: Secondary | ICD-10-CM | POA: Diagnosis not present

## 2024-02-18 DIAGNOSIS — J449 Chronic obstructive pulmonary disease, unspecified: Secondary | ICD-10-CM | POA: Diagnosis not present

## 2024-02-18 DIAGNOSIS — R1084 Generalized abdominal pain: Secondary | ICD-10-CM | POA: Diagnosis not present

## 2024-02-18 DIAGNOSIS — Z888 Allergy status to other drugs, medicaments and biological substances status: Secondary | ICD-10-CM | POA: Diagnosis not present

## 2024-02-18 DIAGNOSIS — Z87891 Personal history of nicotine dependence: Secondary | ICD-10-CM | POA: Diagnosis not present

## 2024-02-18 DIAGNOSIS — R918 Other nonspecific abnormal finding of lung field: Secondary | ICD-10-CM | POA: Diagnosis not present

## 2024-02-18 DIAGNOSIS — I10 Essential (primary) hypertension: Secondary | ICD-10-CM | POA: Diagnosis not present

## 2024-02-18 DIAGNOSIS — N200 Calculus of kidney: Secondary | ICD-10-CM | POA: Diagnosis not present

## 2024-02-18 DIAGNOSIS — R1031 Right lower quadrant pain: Secondary | ICD-10-CM | POA: Diagnosis not present

## 2024-02-18 DIAGNOSIS — Z880 Allergy status to penicillin: Secondary | ICD-10-CM | POA: Diagnosis not present

## 2024-02-18 DIAGNOSIS — R109 Unspecified abdominal pain: Secondary | ICD-10-CM | POA: Diagnosis not present

## 2024-02-18 DIAGNOSIS — Z91012 Allergy to eggs: Secondary | ICD-10-CM | POA: Diagnosis not present

## 2024-02-18 DIAGNOSIS — Z91040 Latex allergy status: Secondary | ICD-10-CM | POA: Diagnosis not present

## 2024-02-18 DIAGNOSIS — K219 Gastro-esophageal reflux disease without esophagitis: Secondary | ICD-10-CM | POA: Diagnosis not present

## 2024-02-21 DIAGNOSIS — G43711 Chronic migraine without aura, intractable, with status migrainosus: Secondary | ICD-10-CM | POA: Diagnosis not present

## 2024-02-21 DIAGNOSIS — G4733 Obstructive sleep apnea (adult) (pediatric): Secondary | ICD-10-CM | POA: Diagnosis not present

## 2024-02-21 DIAGNOSIS — J9611 Chronic respiratory failure with hypoxia: Secondary | ICD-10-CM | POA: Diagnosis not present

## 2024-02-29 DIAGNOSIS — J449 Chronic obstructive pulmonary disease, unspecified: Secondary | ICD-10-CM | POA: Diagnosis not present

## 2024-02-29 DIAGNOSIS — E782 Mixed hyperlipidemia: Secondary | ICD-10-CM | POA: Diagnosis not present

## 2024-02-29 DIAGNOSIS — I10 Essential (primary) hypertension: Secondary | ICD-10-CM | POA: Diagnosis not present

## 2024-02-29 DIAGNOSIS — E1122 Type 2 diabetes mellitus with diabetic chronic kidney disease: Secondary | ICD-10-CM | POA: Diagnosis not present

## 2024-03-01 DIAGNOSIS — E1122 Type 2 diabetes mellitus with diabetic chronic kidney disease: Secondary | ICD-10-CM | POA: Diagnosis not present

## 2024-03-01 DIAGNOSIS — E782 Mixed hyperlipidemia: Secondary | ICD-10-CM | POA: Diagnosis not present

## 2024-03-01 DIAGNOSIS — I10 Essential (primary) hypertension: Secondary | ICD-10-CM | POA: Diagnosis not present

## 2024-03-10 DIAGNOSIS — Z91030 Bee allergy status: Secondary | ICD-10-CM | POA: Diagnosis not present

## 2024-03-10 DIAGNOSIS — Z888 Allergy status to other drugs, medicaments and biological substances status: Secondary | ICD-10-CM | POA: Diagnosis not present

## 2024-03-10 DIAGNOSIS — I10 Essential (primary) hypertension: Secondary | ICD-10-CM | POA: Diagnosis not present

## 2024-03-10 DIAGNOSIS — Z87891 Personal history of nicotine dependence: Secondary | ICD-10-CM | POA: Diagnosis not present

## 2024-03-10 DIAGNOSIS — Z91040 Latex allergy status: Secondary | ICD-10-CM | POA: Diagnosis not present

## 2024-03-10 DIAGNOSIS — Z886 Allergy status to analgesic agent status: Secondary | ICD-10-CM | POA: Diagnosis not present

## 2024-03-10 DIAGNOSIS — R110 Nausea: Secondary | ICD-10-CM | POA: Diagnosis not present

## 2024-03-10 DIAGNOSIS — N3090 Cystitis, unspecified without hematuria: Secondary | ICD-10-CM | POA: Diagnosis not present

## 2024-03-10 DIAGNOSIS — Z885 Allergy status to narcotic agent status: Secondary | ICD-10-CM | POA: Diagnosis not present

## 2024-03-10 DIAGNOSIS — R531 Weakness: Secondary | ICD-10-CM | POA: Diagnosis not present

## 2024-03-10 DIAGNOSIS — Z743 Need for continuous supervision: Secondary | ICD-10-CM | POA: Diagnosis not present

## 2024-03-10 DIAGNOSIS — Z91148 Patient's other noncompliance with medication regimen for other reason: Secondary | ICD-10-CM | POA: Diagnosis not present

## 2024-03-10 DIAGNOSIS — R404 Transient alteration of awareness: Secondary | ICD-10-CM | POA: Diagnosis not present

## 2024-03-10 DIAGNOSIS — K449 Diaphragmatic hernia without obstruction or gangrene: Secondary | ICD-10-CM | POA: Diagnosis not present

## 2024-03-10 DIAGNOSIS — J449 Chronic obstructive pulmonary disease, unspecified: Secondary | ICD-10-CM | POA: Diagnosis not present

## 2024-03-10 DIAGNOSIS — Z79899 Other long term (current) drug therapy: Secondary | ICD-10-CM | POA: Diagnosis not present

## 2024-03-10 DIAGNOSIS — N200 Calculus of kidney: Secondary | ICD-10-CM | POA: Diagnosis not present

## 2024-03-10 DIAGNOSIS — K5730 Diverticulosis of large intestine without perforation or abscess without bleeding: Secondary | ICD-10-CM | POA: Diagnosis not present

## 2024-03-10 DIAGNOSIS — Z7951 Long term (current) use of inhaled steroids: Secondary | ICD-10-CM | POA: Diagnosis not present

## 2024-03-10 DIAGNOSIS — Z880 Allergy status to penicillin: Secondary | ICD-10-CM | POA: Diagnosis not present

## 2024-03-10 DIAGNOSIS — Z91012 Allergy to eggs: Secondary | ICD-10-CM | POA: Diagnosis not present

## 2024-03-13 DIAGNOSIS — R112 Nausea with vomiting, unspecified: Secondary | ICD-10-CM | POA: Diagnosis not present

## 2024-03-13 DIAGNOSIS — R109 Unspecified abdominal pain: Secondary | ICD-10-CM | POA: Diagnosis not present

## 2024-03-20 DIAGNOSIS — E782 Mixed hyperlipidemia: Secondary | ICD-10-CM | POA: Diagnosis not present

## 2024-03-20 DIAGNOSIS — J449 Chronic obstructive pulmonary disease, unspecified: Secondary | ICD-10-CM | POA: Diagnosis not present

## 2024-03-20 DIAGNOSIS — E1122 Type 2 diabetes mellitus with diabetic chronic kidney disease: Secondary | ICD-10-CM | POA: Diagnosis not present

## 2024-03-20 DIAGNOSIS — K2100 Gastro-esophageal reflux disease with esophagitis, without bleeding: Secondary | ICD-10-CM | POA: Diagnosis not present

## 2024-03-20 DIAGNOSIS — K5909 Other constipation: Secondary | ICD-10-CM | POA: Diagnosis not present

## 2024-03-20 DIAGNOSIS — G43711 Chronic migraine without aura, intractable, with status migrainosus: Secondary | ICD-10-CM | POA: Diagnosis not present

## 2024-03-20 DIAGNOSIS — G4733 Obstructive sleep apnea (adult) (pediatric): Secondary | ICD-10-CM | POA: Diagnosis not present

## 2024-03-20 DIAGNOSIS — J302 Other seasonal allergic rhinitis: Secondary | ICD-10-CM | POA: Diagnosis not present

## 2024-03-20 DIAGNOSIS — I10 Essential (primary) hypertension: Secondary | ICD-10-CM | POA: Diagnosis not present

## 2024-03-20 DIAGNOSIS — R109 Unspecified abdominal pain: Secondary | ICD-10-CM | POA: Diagnosis not present

## 2024-03-21 ENCOUNTER — Other Ambulatory Visit: Payer: Self-pay

## 2024-03-21 DIAGNOSIS — I10 Essential (primary) hypertension: Secondary | ICD-10-CM | POA: Diagnosis not present

## 2024-03-21 DIAGNOSIS — R2681 Unsteadiness on feet: Secondary | ICD-10-CM | POA: Diagnosis not present

## 2024-03-21 DIAGNOSIS — Z9181 History of falling: Secondary | ICD-10-CM | POA: Diagnosis not present

## 2024-03-22 ENCOUNTER — Other Ambulatory Visit: Payer: Self-pay | Admitting: Nurse Practitioner

## 2024-03-25 ENCOUNTER — Other Ambulatory Visit: Payer: Self-pay | Admitting: Nurse Practitioner

## 2024-03-25 MED ORDER — METOPROLOL TARTRATE 50 MG PO TABS
50.0000 mg | ORAL_TABLET | Freq: Two times a day (BID) | ORAL | 0 refills | Status: DC
Start: 2024-03-25 — End: 2024-04-24

## 2024-03-25 NOTE — Telephone Encounter (Signed)
 Pt 6  months overdue for Office visit to refill.  Made contact with POA Romero who reports that she will call care giver to schedule appointment

## 2024-03-29 ENCOUNTER — Ambulatory Visit: Admitting: Cardiology

## 2024-03-29 DIAGNOSIS — I10 Essential (primary) hypertension: Secondary | ICD-10-CM | POA: Diagnosis not present

## 2024-03-29 DIAGNOSIS — E1122 Type 2 diabetes mellitus with diabetic chronic kidney disease: Secondary | ICD-10-CM | POA: Diagnosis not present

## 2024-03-29 DIAGNOSIS — J449 Chronic obstructive pulmonary disease, unspecified: Secondary | ICD-10-CM | POA: Diagnosis not present

## 2024-03-29 DIAGNOSIS — E782 Mixed hyperlipidemia: Secondary | ICD-10-CM | POA: Diagnosis not present

## 2024-03-30 DIAGNOSIS — W19XXXA Unspecified fall, initial encounter: Secondary | ICD-10-CM | POA: Diagnosis not present

## 2024-03-30 DIAGNOSIS — W01198A Fall on same level from slipping, tripping and stumbling with subsequent striking against other object, initial encounter: Secondary | ICD-10-CM | POA: Diagnosis not present

## 2024-03-30 DIAGNOSIS — S1989XA Other specified injuries of other specified part of neck, initial encounter: Secondary | ICD-10-CM | POA: Diagnosis not present

## 2024-03-30 DIAGNOSIS — Z91040 Latex allergy status: Secondary | ICD-10-CM | POA: Diagnosis not present

## 2024-03-30 DIAGNOSIS — Z79899 Other long term (current) drug therapy: Secondary | ICD-10-CM | POA: Diagnosis not present

## 2024-03-30 DIAGNOSIS — Z87891 Personal history of nicotine dependence: Secondary | ICD-10-CM | POA: Diagnosis not present

## 2024-03-30 DIAGNOSIS — M79646 Pain in unspecified finger(s): Secondary | ICD-10-CM | POA: Diagnosis not present

## 2024-03-30 DIAGNOSIS — Z91012 Allergy to eggs: Secondary | ICD-10-CM | POA: Diagnosis not present

## 2024-03-30 DIAGNOSIS — S0990XA Unspecified injury of head, initial encounter: Secondary | ICD-10-CM | POA: Diagnosis not present

## 2024-03-30 DIAGNOSIS — S098XXA Other specified injuries of head, initial encounter: Secondary | ICD-10-CM | POA: Diagnosis not present

## 2024-03-30 DIAGNOSIS — R6889 Other general symptoms and signs: Secondary | ICD-10-CM | POA: Diagnosis not present

## 2024-03-30 DIAGNOSIS — M50323 Other cervical disc degeneration at C6-C7 level: Secondary | ICD-10-CM | POA: Diagnosis not present

## 2024-03-30 DIAGNOSIS — Z885 Allergy status to narcotic agent status: Secondary | ICD-10-CM | POA: Diagnosis not present

## 2024-03-30 DIAGNOSIS — M79645 Pain in left finger(s): Secondary | ICD-10-CM | POA: Diagnosis not present

## 2024-03-30 DIAGNOSIS — M47812 Spondylosis without myelopathy or radiculopathy, cervical region: Secondary | ICD-10-CM | POA: Diagnosis not present

## 2024-03-30 DIAGNOSIS — S6982XA Other specified injuries of left wrist, hand and finger(s), initial encounter: Secondary | ICD-10-CM | POA: Diagnosis not present

## 2024-03-30 DIAGNOSIS — Z888 Allergy status to other drugs, medicaments and biological substances status: Secondary | ICD-10-CM | POA: Diagnosis not present

## 2024-03-30 DIAGNOSIS — Z886 Allergy status to analgesic agent status: Secondary | ICD-10-CM | POA: Diagnosis not present

## 2024-03-30 DIAGNOSIS — Z91030 Bee allergy status: Secondary | ICD-10-CM | POA: Diagnosis not present

## 2024-03-30 DIAGNOSIS — Z880 Allergy status to penicillin: Secondary | ICD-10-CM | POA: Diagnosis not present

## 2024-03-30 DIAGNOSIS — I10 Essential (primary) hypertension: Secondary | ICD-10-CM | POA: Diagnosis not present

## 2024-04-01 DIAGNOSIS — E1122 Type 2 diabetes mellitus with diabetic chronic kidney disease: Secondary | ICD-10-CM | POA: Diagnosis not present

## 2024-04-01 DIAGNOSIS — I5032 Chronic diastolic (congestive) heart failure: Secondary | ICD-10-CM | POA: Diagnosis not present

## 2024-04-01 DIAGNOSIS — E782 Mixed hyperlipidemia: Secondary | ICD-10-CM | POA: Diagnosis not present

## 2024-04-05 DIAGNOSIS — R109 Unspecified abdominal pain: Secondary | ICD-10-CM | POA: Diagnosis not present

## 2024-04-05 DIAGNOSIS — J449 Chronic obstructive pulmonary disease, unspecified: Secondary | ICD-10-CM | POA: Diagnosis not present

## 2024-04-05 DIAGNOSIS — E782 Mixed hyperlipidemia: Secondary | ICD-10-CM | POA: Diagnosis not present

## 2024-04-05 DIAGNOSIS — G43711 Chronic migraine without aura, intractable, with status migrainosus: Secondary | ICD-10-CM | POA: Diagnosis not present

## 2024-04-05 DIAGNOSIS — K5909 Other constipation: Secondary | ICD-10-CM | POA: Diagnosis not present

## 2024-04-05 DIAGNOSIS — K2100 Gastro-esophageal reflux disease with esophagitis, without bleeding: Secondary | ICD-10-CM | POA: Diagnosis not present

## 2024-04-05 DIAGNOSIS — G4733 Obstructive sleep apnea (adult) (pediatric): Secondary | ICD-10-CM | POA: Diagnosis not present

## 2024-04-05 DIAGNOSIS — I10 Essential (primary) hypertension: Secondary | ICD-10-CM | POA: Diagnosis not present

## 2024-04-05 DIAGNOSIS — J302 Other seasonal allergic rhinitis: Secondary | ICD-10-CM | POA: Diagnosis not present

## 2024-04-05 DIAGNOSIS — E1122 Type 2 diabetes mellitus with diabetic chronic kidney disease: Secondary | ICD-10-CM | POA: Diagnosis not present

## 2024-04-06 DIAGNOSIS — R404 Transient alteration of awareness: Secondary | ICD-10-CM | POA: Diagnosis not present

## 2024-04-06 DIAGNOSIS — R109 Unspecified abdominal pain: Secondary | ICD-10-CM | POA: Diagnosis not present

## 2024-04-06 DIAGNOSIS — R110 Nausea: Secondary | ICD-10-CM | POA: Diagnosis not present

## 2024-04-06 DIAGNOSIS — R42 Dizziness and giddiness: Secondary | ICD-10-CM | POA: Diagnosis not present

## 2024-04-06 DIAGNOSIS — I10 Essential (primary) hypertension: Secondary | ICD-10-CM | POA: Diagnosis not present

## 2024-04-06 DIAGNOSIS — Z880 Allergy status to penicillin: Secondary | ICD-10-CM | POA: Diagnosis not present

## 2024-04-06 DIAGNOSIS — Z91040 Latex allergy status: Secondary | ICD-10-CM | POA: Diagnosis not present

## 2024-04-06 DIAGNOSIS — R0781 Pleurodynia: Secondary | ICD-10-CM | POA: Diagnosis not present

## 2024-04-06 DIAGNOSIS — J449 Chronic obstructive pulmonary disease, unspecified: Secondary | ICD-10-CM | POA: Diagnosis not present

## 2024-04-06 DIAGNOSIS — Z87891 Personal history of nicotine dependence: Secondary | ICD-10-CM | POA: Diagnosis not present

## 2024-04-06 DIAGNOSIS — Z79899 Other long term (current) drug therapy: Secondary | ICD-10-CM | POA: Diagnosis not present

## 2024-04-06 DIAGNOSIS — Z886 Allergy status to analgesic agent status: Secondary | ICD-10-CM | POA: Diagnosis not present

## 2024-04-10 ENCOUNTER — Other Ambulatory Visit: Payer: Self-pay | Admitting: Gastroenterology

## 2024-04-10 ENCOUNTER — Other Ambulatory Visit: Payer: Self-pay | Admitting: Cardiology

## 2024-04-13 DIAGNOSIS — R109 Unspecified abdominal pain: Secondary | ICD-10-CM | POA: Diagnosis not present

## 2024-04-13 DIAGNOSIS — R112 Nausea with vomiting, unspecified: Secondary | ICD-10-CM | POA: Diagnosis not present

## 2024-04-13 DIAGNOSIS — Z743 Need for continuous supervision: Secondary | ICD-10-CM | POA: Diagnosis not present

## 2024-04-17 DIAGNOSIS — E1122 Type 2 diabetes mellitus with diabetic chronic kidney disease: Secondary | ICD-10-CM | POA: Diagnosis not present

## 2024-04-17 DIAGNOSIS — I129 Hypertensive chronic kidney disease with stage 1 through stage 4 chronic kidney disease, or unspecified chronic kidney disease: Secondary | ICD-10-CM | POA: Diagnosis not present

## 2024-04-17 DIAGNOSIS — Z1329 Encounter for screening for other suspected endocrine disorder: Secondary | ICD-10-CM | POA: Diagnosis not present

## 2024-04-17 DIAGNOSIS — I10 Essential (primary) hypertension: Secondary | ICD-10-CM | POA: Diagnosis not present

## 2024-04-17 DIAGNOSIS — E7849 Other hyperlipidemia: Secondary | ICD-10-CM | POA: Diagnosis not present

## 2024-04-22 DIAGNOSIS — I10 Essential (primary) hypertension: Secondary | ICD-10-CM | POA: Diagnosis not present

## 2024-04-22 DIAGNOSIS — Z23 Encounter for immunization: Secondary | ICD-10-CM | POA: Diagnosis not present

## 2024-04-22 DIAGNOSIS — J9611 Chronic respiratory failure with hypoxia: Secondary | ICD-10-CM | POA: Diagnosis not present

## 2024-04-22 DIAGNOSIS — E1122 Type 2 diabetes mellitus with diabetic chronic kidney disease: Secondary | ICD-10-CM | POA: Diagnosis not present

## 2024-04-24 ENCOUNTER — Other Ambulatory Visit: Payer: Self-pay | Admitting: Nurse Practitioner

## 2024-04-24 ENCOUNTER — Other Ambulatory Visit: Payer: Self-pay | Admitting: Cardiology

## 2024-04-24 DIAGNOSIS — E782 Mixed hyperlipidemia: Secondary | ICD-10-CM | POA: Diagnosis not present

## 2024-04-24 DIAGNOSIS — G4733 Obstructive sleep apnea (adult) (pediatric): Secondary | ICD-10-CM | POA: Diagnosis not present

## 2024-04-24 DIAGNOSIS — R109 Unspecified abdominal pain: Secondary | ICD-10-CM | POA: Diagnosis not present

## 2024-04-24 DIAGNOSIS — G43711 Chronic migraine without aura, intractable, with status migrainosus: Secondary | ICD-10-CM | POA: Diagnosis not present

## 2024-04-24 DIAGNOSIS — J449 Chronic obstructive pulmonary disease, unspecified: Secondary | ICD-10-CM | POA: Diagnosis not present

## 2024-04-24 DIAGNOSIS — I10 Essential (primary) hypertension: Secondary | ICD-10-CM | POA: Diagnosis not present

## 2024-04-24 DIAGNOSIS — E1122 Type 2 diabetes mellitus with diabetic chronic kidney disease: Secondary | ICD-10-CM | POA: Diagnosis not present

## 2024-04-24 DIAGNOSIS — K2100 Gastro-esophageal reflux disease with esophagitis, without bleeding: Secondary | ICD-10-CM | POA: Diagnosis not present

## 2024-04-24 DIAGNOSIS — J302 Other seasonal allergic rhinitis: Secondary | ICD-10-CM | POA: Diagnosis not present

## 2024-04-24 DIAGNOSIS — K5909 Other constipation: Secondary | ICD-10-CM | POA: Diagnosis not present

## 2024-04-24 MED ORDER — METOPROLOL TARTRATE 50 MG PO TABS: 50.0000 mg | ORAL_TABLET | Freq: Two times a day (BID) | ORAL | 0 refills | Status: DC

## 2024-04-30 DIAGNOSIS — E1122 Type 2 diabetes mellitus with diabetic chronic kidney disease: Secondary | ICD-10-CM | POA: Diagnosis not present

## 2024-04-30 DIAGNOSIS — J449 Chronic obstructive pulmonary disease, unspecified: Secondary | ICD-10-CM | POA: Diagnosis not present

## 2024-04-30 DIAGNOSIS — E782 Mixed hyperlipidemia: Secondary | ICD-10-CM | POA: Diagnosis not present

## 2024-04-30 DIAGNOSIS — I10 Essential (primary) hypertension: Secondary | ICD-10-CM | POA: Diagnosis not present

## 2024-05-15 DIAGNOSIS — G4733 Obstructive sleep apnea (adult) (pediatric): Secondary | ICD-10-CM | POA: Diagnosis not present

## 2024-05-15 DIAGNOSIS — J449 Chronic obstructive pulmonary disease, unspecified: Secondary | ICD-10-CM | POA: Diagnosis not present

## 2024-05-15 DIAGNOSIS — E782 Mixed hyperlipidemia: Secondary | ICD-10-CM | POA: Diagnosis not present

## 2024-05-15 DIAGNOSIS — I10 Essential (primary) hypertension: Secondary | ICD-10-CM | POA: Diagnosis not present

## 2024-05-15 DIAGNOSIS — K2100 Gastro-esophageal reflux disease with esophagitis, without bleeding: Secondary | ICD-10-CM | POA: Diagnosis not present

## 2024-05-15 DIAGNOSIS — R109 Unspecified abdominal pain: Secondary | ICD-10-CM | POA: Diagnosis not present

## 2024-05-15 DIAGNOSIS — E1122 Type 2 diabetes mellitus with diabetic chronic kidney disease: Secondary | ICD-10-CM | POA: Diagnosis not present

## 2024-05-15 DIAGNOSIS — K5909 Other constipation: Secondary | ICD-10-CM | POA: Diagnosis not present

## 2024-05-15 DIAGNOSIS — G43711 Chronic migraine without aura, intractable, with status migrainosus: Secondary | ICD-10-CM | POA: Diagnosis not present

## 2024-05-15 DIAGNOSIS — J302 Other seasonal allergic rhinitis: Secondary | ICD-10-CM | POA: Diagnosis not present

## 2024-05-20 DIAGNOSIS — Z743 Need for continuous supervision: Secondary | ICD-10-CM | POA: Diagnosis not present

## 2024-05-20 DIAGNOSIS — T07XXXA Unspecified multiple injuries, initial encounter: Secondary | ICD-10-CM | POA: Diagnosis not present

## 2024-05-21 ENCOUNTER — Other Ambulatory Visit: Payer: Self-pay | Admitting: Cardiology

## 2024-05-26 DIAGNOSIS — H9209 Otalgia, unspecified ear: Secondary | ICD-10-CM | POA: Diagnosis not present

## 2024-05-26 DIAGNOSIS — J449 Chronic obstructive pulmonary disease, unspecified: Secondary | ICD-10-CM | POA: Diagnosis not present

## 2024-05-26 DIAGNOSIS — Z87891 Personal history of nicotine dependence: Secondary | ICD-10-CM | POA: Diagnosis not present

## 2024-05-26 DIAGNOSIS — R110 Nausea: Secondary | ICD-10-CM | POA: Diagnosis not present

## 2024-05-26 DIAGNOSIS — Z7951 Long term (current) use of inhaled steroids: Secondary | ICD-10-CM | POA: Diagnosis not present

## 2024-05-26 DIAGNOSIS — Z7985 Long-term (current) use of injectable non-insulin antidiabetic drugs: Secondary | ICD-10-CM | POA: Diagnosis not present

## 2024-05-26 DIAGNOSIS — Z743 Need for continuous supervision: Secondary | ICD-10-CM | POA: Diagnosis not present

## 2024-05-26 DIAGNOSIS — I10 Essential (primary) hypertension: Secondary | ICD-10-CM | POA: Diagnosis not present

## 2024-05-26 DIAGNOSIS — Z5982 Transportation insecurity: Secondary | ICD-10-CM | POA: Diagnosis not present

## 2024-05-26 DIAGNOSIS — Z59869 Financial insecurity, unspecified: Secondary | ICD-10-CM | POA: Diagnosis not present

## 2024-05-26 DIAGNOSIS — Z79899 Other long term (current) drug therapy: Secondary | ICD-10-CM | POA: Diagnosis not present

## 2024-05-28 ENCOUNTER — Ambulatory Visit: Admitting: Cardiology

## 2024-05-29 ENCOUNTER — Other Ambulatory Visit: Payer: Self-pay | Admitting: Cardiology

## 2024-06-06 ENCOUNTER — Encounter (INDEPENDENT_AMBULATORY_CARE_PROVIDER_SITE_OTHER): Payer: Self-pay | Admitting: Gastroenterology

## 2024-06-10 ENCOUNTER — Other Ambulatory Visit: Payer: Self-pay | Admitting: Nurse Practitioner

## 2024-06-13 ENCOUNTER — Encounter: Payer: Self-pay | Admitting: Physician Assistant

## 2024-06-19 ENCOUNTER — Encounter (INDEPENDENT_AMBULATORY_CARE_PROVIDER_SITE_OTHER): Payer: Self-pay | Admitting: *Deleted

## 2024-06-19 ENCOUNTER — Other Ambulatory Visit: Payer: Self-pay | Admitting: Cardiology

## 2024-06-25 ENCOUNTER — Other Ambulatory Visit: Payer: Self-pay | Admitting: Nurse Practitioner

## 2024-07-01 MED ORDER — FUROSEMIDE 40 MG PO TABS: 40.0000 mg | ORAL_TABLET | Freq: Every day | ORAL | 0 refills | Status: AC

## 2024-07-09 ENCOUNTER — Ambulatory Visit: Attending: Cardiology | Admitting: Cardiology

## 2024-07-17 ENCOUNTER — Other Ambulatory Visit: Payer: Self-pay | Admitting: Cardiology

## 2024-08-06 ENCOUNTER — Other Ambulatory Visit: Payer: Self-pay | Admitting: Gastroenterology

## 2024-08-19 ENCOUNTER — Other Ambulatory Visit: Payer: Self-pay | Admitting: Cardiology

## 2024-08-28 NOTE — Progress Notes (Incomplete)
 "      Memory Impairment   Charles Mathews is a very pleasant 64 y.o. year old RH male with a history of hypertension, hyperlipidemia, depression, chronic hypoxemic respiratory failure, OSA noncompliant with CPAP anxiety, seen today for evaluation of memory loss. MoCA today is ***. Etiology is unclear at this time, workup is in progress, however there may be a component of anxiety, loneliness and lack of structure support.***. Patient is able to participate on ADLs and to to drive without difficulties. Mood is *** . Patient is accompanied by *** who supplement the history.  Follow up in *** months  *** pending on the above results  MRI brain to further evaluate for structural abnormalities and vascular load  Neuropsychological evaluation for further investigate other causes of memory loss including sleep, attention, anxiety, depression among others Recommend increasing socialization, continue psychiatric follow-up for mood disorder.  He is on fluoxetine, Latuda, trazodone*** Check B12 TSH Recommend using CPAP for OSA.   Discussed the use of AI scribe software for clinical note transcription with the patient, who gave verbal consent to proceed.  History of Present Illness      How long did patient have memory difficulties?  For about.  Patient reports some difficulty remembering new information, recent conversations, names.  repeats oneself?  Disoriented  at home, or in the neighborhood? Any wandering tendencies ?   MISplacing things? Any changes in mood, agitation, irritability? Any depression, anxiety? Hallucinations, paranoia?  Sleeping well?  He has a history of insomnia, takes trazodone and melatonin with some relief.  Any nightmares, bad dreams, vivid dreams?  Any shadow boxing,  sleepwalking? Sleep apnea?  He has a history of sleep apnea, nonadherent to the CPAP Any changes in appetite? Patient have trouble swallowing? Does the patient cook? Any kitchen accidents such as leaving  the stove on? Walking ok? Any falls or head injuries? Needs help wo get dressed or changes in shower patterns?  Who is in charge of the medicines?  KC is in charge of the medicines as he is at high risk for nonadherence.*** Who is in charge of the finances?  Any headaches? Double vision? Any stroke symptoms? Chronic pain  Any tremors? Any anosmia? Any urine  issues?  He has urine frequency. Any constipation or diarrhea ?  He has chronic constipation Patient lives with ***  History of heavy alcohol intake? Denies.   History of heavy tobacco use? Denies.   Family history of dementia?   *** with dementia  Does patient drive? No longer drives  *** yes, denies getting lost.***     Allergies[1]  Current Outpatient Medications  Medication Instructions   ACCU-CHEK GUIDE test strip USE ONCE DAILY TO TEST SUGAR   Accu-Chek Softclix Lancets lancets USE TO TEST ONCE DAILY   acetaminophen  (TYLENOL ) 1,300 mg, Oral, Every 8 hours PRN   albuterol  (VENTOLIN  HFA) 108 (90 Base) MCG/ACT inhaler 1 puff, Inhalation, Every 6 hours PRN   aspirin EC 81 mg, Oral, Daily   Bioflavonoid Products (VITA C/BIOFLAVONOIDS/ROSE HIPS) 1000-30-18 MG TABS 1 tablet, Oral, Daily   Blood Glucose Monitoring Suppl (ACCU-CHEK GUIDE) w/Device KIT USE ONCE DAILY TO TEST SUAGR   Budeson-Glycopyrrol-Formoterol (BREZTRI AEROSPHERE) 160-9-4.8 MCG/ACT AERO 2 puffs, 2 times daily   candesartan (ATACAND) 16 MG tablet Take by mouth.   Carboxymethylcellul-Glycerin (CLEAR EYES FOR DRY EYES) 1-0.25 % SOLN 1 drop, Both Eyes, Daily PRN   cetirizine (ZYRTEC) 10 mg, Oral, Daily   dicyclomine  (BENTYL ) 10 mg, Oral, 3 times  daily PRN   doxazosin  (CARDURA ) 2 MG tablet TAKE 1 TABLET BY MOUTH DAILY - MUST SCHEDULE APPOINTMENT FOR MORE REFILLS   esomeprazole  (NEXIUM ) 40 mg, Oral, 2 times daily before meals   famotidine (PEPCID) 20 mg, Oral, 2 times daily   FLUoxetine (PROZAC) 20 mg, Oral, Daily   fluticasone  (FLONASE ) 50 MCG/ACT nasal spray  INSTILL ONE SPRAY INTO BOTH NOSTRILS DAILY   furosemide  (LASIX ) 40 mg, Oral, Daily   gabapentin (NEURONTIN) 1,300 mg, Oral, Daily at bedtime   hydrOXYzine (ATARAX) 25 mg, Daily   LATUDA 40 MG TABS tablet Oral   meclizine (ANTIVERT) 12.5 mg, Oral, Daily   metFORMIN (GLUCOPHAGE) 500 mg, Oral, Daily   metoprolol  tartrate (LOPRESSOR ) 50 mg, Oral, 2 times daily   Multiple Vitamin (MULTIVITAMIN) tablet 2 tablets, Oral, Daily, Gummies   Ozempic (0.25 or 0.5 MG/DOSE) 0.5 mg, Subcutaneous, Weekly   potassium chloride  SA (KLOR-CON  M) 20 MEQ tablet 20 mEq, Oral, Daily   predniSONE  (DELTASONE ) 20 MG tablet 2 tablet a Mathews for 4 days   rosuvastatin (CRESTOR) 40 mg, Oral, Daily   sucralfate  (CARAFATE ) 1 g, Oral, 3 times daily with meals & bedtime   topiramate (TOPAMAX) 50 mg, Oral, Daily   traMADol (ULTRAM) 50 mg, Oral, Every 6 hours PRN   traZODone (DESYREL) 100 mg, Oral, Daily at bedtime    VITALS:  There were no vitals filed for this visit.   Neurological Exam      No data to display              No data to display            Orientation:  Alert and oriented to person, place and not to time***. No aphasia or dysarthria. Fund of knowledge is appropriate. Recent and remote memory impaired.  Attention and concentration are reduced***.  Able to name objects and repeat phrases. *** Delayed recall  /5 .*** Cranial nerves: There is good facial symmetry. Extraocular muscles are intact and visual fields are full to confrontational testing. Speech is fluent and clear. No tongue deviation. Hearing is intact to conversational tone.*** Tone: Tone is good throughout. Sensation: Sensation is intact to light touch.  Vibration is intact at the bilateral big toe.  Coordination: The patient has no difficulty with RAM's or FNF bilaterally. Normal finger to nose  Motor: Strength is 5/5 in the bilateral upper and lower extremities. There is no pronator drift. There are no fasciculations noted. DTR's: Deep  tendon reflexes are 2/4 bilaterally. Gait and Station: The patient is able to ambulate without difficulty. Gait is cautious and narrow. Stride length is normal. ***      Thank you for allowing us  the opportunity to participate in the care of this nice patient. Please do not hesitate to contact us  for any questions or concerns.   Total time spent on today's visit was *** minutes dedicated to this patient today, preparing to see patient, examining the patient, ordering tests and/or medications and counseling the patient, documenting clinical information in the EHR or other health record, independently interpreting results and communicating results to the patient/family, discussing treatment and goals, answering patient's questions and coordinating care.  Cc:  Toribio Jerel MATSU, MD  Camie Sevin 08/28/2024 7:14 AM       [1]  Allergies Allergen Reactions   Bee Venom Anaphylaxis   Benadryl  [Diphenhydramine ]     Makes him feel weird, like he is going to pass out   Lorazepam  Other (See Comments)  Makes him feel woozy.   Penicillins Other (See Comments)    CHILDHOOD ALLERGY   Latex Rash   "

## 2024-08-30 ENCOUNTER — Ambulatory Visit

## 2024-08-30 ENCOUNTER — Ambulatory Visit: Admitting: Physician Assistant

## 2024-09-04 ENCOUNTER — Telehealth (INDEPENDENT_AMBULATORY_CARE_PROVIDER_SITE_OTHER): Payer: Self-pay

## 2024-09-04 NOTE — Telephone Encounter (Signed)
Ok to schedule.  Room :Any   Thanks,  Vista Lawman, MD Gastroenterology and Hepatology [facility]

## 2024-09-04 NOTE — Telephone Encounter (Signed)
 Who is your primary care physician: Jerel KANDICE Sieving, MD  Reasons for the colonoscopy: screening, history of colon polyps  Have you had a colonoscopy before?  Yes, 2-3 years ago  Do you have family history of colon cancer? No   Previous colonoscopy with polyps removed? Yes, 2-3 years ago  Do you have a history colorectal cancer?   no  Are you diabetic? If yes, Type 1 or Type 2?    no  Do you have a prosthetic or mechanical heart valve? no  Do you have a pacemaker/defibrillator?   no  Have you had endocarditis/atrial fibrillation? no  Have you had joint replacement within the last 12 months?  no  Do you tend to be constipated or have to use laxatives? yes  Do you have any history of drugs or alcohol?  no  Do you use supplemental oxygen ?  yes  Have you had a stroke or heart attack within the last 6 months? no  Do you take weight loss medication?  no  Do you take any blood-thinning medications such as: (aspirin, warfarin, Plavix, Aggrenox)  no  If yes we need the name, milligram, dosage and who is prescribing doctor   Current Outpatient Medications  Medication Sig Dispense Refill   ACCU-CHEK GUIDE test strip USE ONCE DAILY TO TEST SUGAR     Accu-Chek Softclix Lancets lancets USE TO TEST ONCE DAILY     albuterol  (VENTOLIN  HFA) 108 (90 Base) MCG/ACT inhaler Inhale 1 puff into the lungs every 6 (six) hours as needed for wheezing or shortness of breath.     aspirin EC 81 MG tablet Take 81 mg by mouth daily.     Blood Glucose Monitoring Suppl (ACCU-CHEK GUIDE) w/Device KIT USE ONCE DAILY TO TEST SUAGR     candesartan (ATACAND) 16 MG tablet Take by mouth.     Carboxymethylcellul-Glycerin (CLEAR EYES FOR DRY EYES) 1-0.25 % SOLN Place 1 drop into both eyes daily as needed (dry eyes).     esomeprazole  (NEXIUM ) 40 MG capsule TAKE ONE CAPSULE BY MOUTH TWICE DAILY BEFORE MEALS 60 capsule 3   ezetimibe (ZETIA) 10 MG tablet Take 10 mg by mouth daily.     FLUoxetine (PROZAC) 20 MG capsule  Take 20 mg by mouth daily.     furosemide  (LASIX ) 40 MG tablet Take 1 tablet (40 mg total) by mouth daily. 15 tablet 0   gabapentin (NEURONTIN) 100 MG capsule Take 1,300 mg by mouth at bedtime.     hydrOXYzine (ATARAX) 25 MG tablet Take 25 mg by mouth daily.     meclizine (ANTIVERT) 25 MG tablet Take 12.5 mg by mouth daily.     metFORMIN (GLUCOPHAGE) 500 MG tablet Take 500 mg by mouth daily.     metoprolol  tartrate (LOPRESSOR ) 50 MG tablet TAKE 1 TABLET BY MOUTH TWICE DAILY 30 tablet 0   omeprazole  (PRILOSEC) 20 MG capsule Take 20 mg by mouth daily.     potassium chloride  SA (KLOR-CON  M) 20 MEQ tablet TAKE 1 TABLET BY MOUTH DAILY 90 tablet 1   rosuvastatin (CRESTOR) 40 MG tablet Take 40 mg by mouth daily.     topiramate (TOPAMAX) 50 MG tablet Take 50 mg by mouth daily.     traZODone (DESYREL) 100 MG tablet Take 100 mg by mouth at bedtime.     No current facility-administered medications for this visit.    Allergies[1]  Pharmacy: [facility]  Primary Insurance Name: Medicare  Best number where you can be reached: [phone]     [  1]  Allergies Allergen Reactions   Bee Venom Anaphylaxis   Benadryl  [Diphenhydramine ]     Makes him feel weird, like he is going to pass out   Lorazepam  Other (See Comments)    Makes him feel woozy.   Penicillins Other (See Comments)    CHILDHOOD ALLERGY   Latex Rash

## 2024-10-10 ENCOUNTER — Ambulatory Visit: Admitting: Cardiology
